# Patient Record
Sex: Female | Born: 1990
Health system: Southern US, Community
[De-identification: ages and names within clinical notes are randomized; demographics above are authoritative.]

## PROBLEM LIST (undated history)

## (undated) DIAGNOSIS — F329 Major depressive disorder, single episode, unspecified: Secondary | ICD-10-CM

## (undated) DIAGNOSIS — F32A Depression, unspecified: Secondary | ICD-10-CM

## (undated) DIAGNOSIS — F419 Anxiety disorder, unspecified: Secondary | ICD-10-CM

## (undated) DIAGNOSIS — D649 Anemia, unspecified: Secondary | ICD-10-CM

## (undated) DIAGNOSIS — G43909 Migraine, unspecified, not intractable, without status migrainosus: Secondary | ICD-10-CM

## (undated) DIAGNOSIS — J45909 Unspecified asthma, uncomplicated: Secondary | ICD-10-CM

## (undated) DIAGNOSIS — Z720 Tobacco use: Secondary | ICD-10-CM

## (undated) DIAGNOSIS — T7840XA Allergy, unspecified, initial encounter: Secondary | ICD-10-CM

## (undated) HISTORY — PX: WISDOM TOOTH EXTRACTION: SHX21

## (undated) HISTORY — DX: Anxiety disorder, unspecified: F41.9

## (undated) HISTORY — DX: Migraine, unspecified, not intractable, without status migrainosus: G43.909

## (undated) HISTORY — PX: COSMETIC SURGERY: SHX468

## (undated) HISTORY — PX: NOSE SURGERY: SHX723

## (undated) HISTORY — DX: Major depressive disorder, single episode, unspecified: F32.9

## (undated) HISTORY — DX: Allergy, unspecified, initial encounter: T78.40XA

## (undated) HISTORY — DX: Unspecified asthma, uncomplicated: J45.909

## (undated) HISTORY — DX: Depression, unspecified: F32.A

## (undated) HISTORY — PX: ABDOMINAL HYSTERECTOMY: SHX81

## (undated) HISTORY — DX: Tobacco use: Z72.0

---
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
## 2008-06-04 ENCOUNTER — Emergency Department (HOSPITAL_COMMUNITY): Admission: EM | Admit: 2008-06-04 | Discharge: 2008-06-04 | Payer: Self-pay | Admitting: Family Medicine

## 2008-09-08 ENCOUNTER — Emergency Department (HOSPITAL_COMMUNITY): Admission: EM | Admit: 2008-09-08 | Discharge: 2008-09-08 | Payer: Self-pay | Admitting: Family Medicine

## 2008-11-21 ENCOUNTER — Emergency Department (HOSPITAL_COMMUNITY): Admission: EM | Admit: 2008-11-21 | Discharge: 2008-11-22 | Payer: Self-pay | Admitting: Emergency Medicine

## 2013-04-26 ENCOUNTER — Ambulatory Visit (INDEPENDENT_AMBULATORY_CARE_PROVIDER_SITE_OTHER): Payer: BC Managed Care – PPO | Admitting: Internal Medicine

## 2013-04-26 VITALS — BP 102/72 | HR 67 | Temp 98.8°F | Resp 17 | Ht 60.0 in | Wt 136.0 lb

## 2013-04-26 DIAGNOSIS — J029 Acute pharyngitis, unspecified: Secondary | ICD-10-CM

## 2013-04-26 DIAGNOSIS — H9209 Otalgia, unspecified ear: Secondary | ICD-10-CM

## 2013-04-26 DIAGNOSIS — H9202 Otalgia, left ear: Secondary | ICD-10-CM

## 2013-04-26 LAB — POCT RAPID STREP A (OFFICE): Rapid Strep A Screen: NEGATIVE

## 2013-04-26 MED ORDER — DOXYCYCLINE HYCLATE 100 MG PO TABS: 100.0000 mg | ORAL_TABLET | Freq: Two times a day (BID) | ORAL | Status: DC

## 2013-04-26 NOTE — Progress Notes (Signed)
  Subjective:    Patient ID: Jessica Cross, female    DOB: 1990/09/26, 22 y.o.   MRN: [id number]  HPI complaining of 24-hour history of left ear pain with mild sore throat on the left No fever No dysphagia No change in hearing No cough or runny nose    Review of Systems     Objective:   Physical Exam BP 102/72  Pulse 67  Temp(Src) 98.8 F (37.1 C) (Oral)  Resp 17  Ht 5' (1.524 m)  Wt 136 lb (61.689 kg)  BMI 26.56 kg/m2  SpO2 99%  LMP 04/12/2013  both TMs clear There is a swollen area inferiorly at the auditory meatus which is not read or pustular but is very tender to palpation No regional adenopathy The rest of the ENT is clear except mild erythema in the posterior pharynx without exudate   Results for orders placed in visit on 04/26/13  POCT RAPID STREP A (OFFICE)      Result Value Range   Rapid Strep A Screen Negative  Negative       Assessment & Plan:  Furuncle at aud meatus-L Viral pharyngitis Meds ordered this encounter  Medications  . doxycycline (VIBRA-TABS) 100 MG tablet    Sig: Take 1 tablet (100 mg total) by mouth 2 (two) times daily.    Dispense:  20 tablet    Refill:  0  Hot compresses Followup if increased swelling to consider I&D

## 2013-04-28 LAB — CULTURE, GROUP A STREP

## 2013-05-23 ENCOUNTER — Ambulatory Visit (INDEPENDENT_AMBULATORY_CARE_PROVIDER_SITE_OTHER): Payer: BC Managed Care – PPO | Admitting: Emergency Medicine

## 2013-05-23 VITALS — BP 110/70 | HR 62 | Temp 98.7°F | Resp 16 | Ht 60.0 in | Wt 138.0 lb

## 2013-05-23 DIAGNOSIS — J029 Acute pharyngitis, unspecified: Secondary | ICD-10-CM

## 2013-05-23 DIAGNOSIS — J40 Bronchitis, not specified as acute or chronic: Secondary | ICD-10-CM

## 2013-05-23 LAB — POCT RAPID STREP A (OFFICE): Rapid Strep A Screen: NEGATIVE

## 2013-05-23 NOTE — Patient Instructions (Signed)

## 2013-05-23 NOTE — Progress Notes (Signed)
[facility] [address] [place] [address] [phone]  Date:  05/23/2013   Name:  Jessica Cross   DOB:  03/21/1991   MRN:  [id number]  PCP:  Vonita Moss, MD    Chief Complaint: Cough and Sore Throat   History of Present Illness:  Jessica Cross is a 22 y.o. very pleasant female patient who presents with the following:  Has 3-4 week history of sore throat.  Says she has a dry feeling throat and a moderate nasal mucoid drainage.  No post nasal drip, fever or chills.  No nausea or vomiting. Now has cough that is not productive over past three days.  no wheezing or shortness of breath.  Waxes and wanes.  No improvement with over the counter medications or other home remedies. Denies other complaint or health concern today.   There are no active problems to display for this patient.   Past Medical History  Diagnosis Date  . Asthma   . Depression     No past surgical history on file.  History  Substance Use Topics  . Smoking status: Current Every Day Smoker  . Smokeless tobacco: Not on file  . Alcohol Use: Yes    No family history on file.  No Known Allergies  Medication list has been reviewed and updated.  Current Outpatient Prescriptions on File Prior to Visit  Medication Sig Dispense Refill  . FLUoxetine (PROZAC) 20 MG capsule Take 20 mg by mouth daily.      . Norgestimate-Ethinyl Estradiol Triphasic (ORTHO TRI-CYCLEN LO) 0.18/0.215/0.25 MG-25 MCG tab Take 1 tablet by mouth daily.      Marland Kitchen doxycycline (VIBRA-TABS) 100 MG tablet Take 1 tablet (100 mg total) by mouth 2 (two) times daily.  20 tablet  0   No current facility-administered medications on file prior to visit.    Review of Systems:  As per HPI, otherwise negative.    Physical Examination: Filed Vitals:   05/23/13 1743  BP: 110/70  Pulse: 62  Temp: 98.7 F (37.1 C)  Resp: 16   Filed Vitals:   05/23/13 1743  Height: 5' (1.524 m)  Weight: 138 lb (62.596 kg)   Body mass  index is 26.95 kg/(m^2). Ideal Body Weight: Weight in (lb) to have BMI = 25: 127.7  GEN: WDWN, NAD, Non-toxic, A & O x 3 HEENT: Atraumatic, Normocephalic. Neck supple. No masses, anterior cervical LAD. Ears and Nose: No external deformity. CV: RRR, No M/G/R. No JVD. No thrill. No extra heart sounds. PULM: CTA B, no wheezes, crackles, rhonchi. No retractions. No resp. distress. No accessory muscle use. ABD: S, NT, ND, +BS. No rebound. No HSM. EXTR: No c/c/e NEURO Normal gait.  PSYCH: Normally interactive. Conversant. Not depressed or anxious appearing.  Calm demeanor.    Assessment and Plan: Bronchitis Cold   Signed,  Phillips Odor, MD   Results for orders placed in visit on 05/23/13  POCT RAPID STREP A (OFFICE)      Result Value Range   Rapid Strep A Screen Negative  Negative

## 2013-09-09 DIAGNOSIS — D649 Anemia, unspecified: Secondary | ICD-10-CM | POA: Insufficient documentation

## 2014-10-20 ENCOUNTER — Ambulatory Visit (INDEPENDENT_AMBULATORY_CARE_PROVIDER_SITE_OTHER): Payer: 59 | Admitting: Family Medicine

## 2014-10-20 VITALS — BP 110/72 | HR 76 | Temp 98.7°F | Resp 18 | Ht 61.0 in | Wt 153.0 lb

## 2014-10-20 DIAGNOSIS — J029 Acute pharyngitis, unspecified: Secondary | ICD-10-CM

## 2014-10-20 LAB — POCT RAPID STREP A (OFFICE): RAPID STREP A SCREEN: NEGATIVE

## 2014-10-20 MED ORDER — FIRST-DUKES MOUTHWASH MT SUSP: OROMUCOSAL | Status: DC

## 2014-10-20 NOTE — Patient Instructions (Signed)
You have ulcers on your tonsils- this likely indicates a viral throat infection. I will be in touch with your culture but it will probably be negative for strep. Rest, drink plenty of fluids, use ibuprofen for pain (up to 800 mg three times a day) and/ or tylenol You can also use the throat wash as needed Let me know if you are not feeling better in the next couple of days or if you develop any other symptoms.

## 2014-10-20 NOTE — Progress Notes (Signed)
[facility] [address], [place] [address] [phone]  Date:  10/20/2014   Name:  Jessica Cross   DOB:  [birth year]   MRN:  [id number]  PCP:  Golden Pop, MD    Chief Complaint: Sore Throat   History of Present Illness:  Jessica Cross is a 24 y.o. very pleasant female patient who presents with the following:  She is here today with a ST that started yesterday.  She has had a few bad ST in the past, but nothing very recently.  She was recenttly around her newborn nephew.  She feels well except for her throat.  It hurts to talk and swallow.  No cough.    There are no active problems to display for this patient.   Past Medical History  Diagnosis Date  . Asthma   . Depression     Past Surgical History  Procedure Laterality Date  . Cosmetic surgery    . Nose surgery      History  Substance Use Topics  . Smoking status: Current Every Day Smoker  . Smokeless tobacco: Not on file  . Alcohol Use: 0.0 oz/week    0 Standard drinks or equivalent per week    History reviewed. No pertinent family history.  No Known Allergies  Medication list has been reviewed and updated.  Current Outpatient Prescriptions on File Prior to Visit  Medication Sig Dispense Refill  . FLUoxetine (PROZAC) 20 MG capsule Take 20 mg by mouth daily.    . Norgestimate-Ethinyl Estradiol Triphasic (ORTHO TRI-CYCLEN LO) 0.18/0.215/0.25 MG-25 MCG tab Take 1 tablet by mouth daily.    Marland Kitchen doxycycline (VIBRA-TABS) 100 MG tablet Take 1 tablet (100 mg total) by mouth 2 (two) times daily. (Patient not taking: Reported on 10/20/2014) 20 tablet 0   No current facility-administered medications on file prior to visit.    Review of Systems:  As per HPI- otherwise negative.   Physical Examination: Filed Vitals:   10/20/14 1158  BP: 110/72  Pulse: 76  Temp: 98.7 F (37.1 C)  Resp: 18   Filed Vitals:   10/20/14 1158  Height: 5\' 1"  (1.549 m)  Weight: 153 lb (69.4 kg)   Body  mass index is 28.92 kg/(m^2). Ideal Body Weight: Weight in (lb) to have BMI = 25: 132  GEN: WDWN, NAD, Non-toxic, A & O x 3 HEENT: Atraumatic, Normocephalic. Neck supple. No masses, No LAD.  Bilateral TM wnl, oropharynx displays small ulcers on tonsils. No exudate or tonsilar enlargement.  PEERL,EOMI.   Ears and Nose: No external deformity. CV: RRR, No M/G/R. No JVD. No thrill. No extra heart sounds. PULM: CTA B, no wheezes, crackles, rhonchi. No retractions. No resp. distress. No accessory muscle use. ABD: S, NT, ND. No rebound. No HSM. EXTR: No c/c/e NEURO Normal gait.  PSYCH: Normally interactive. Conversant. Not depressed or anxious appearing.  Calm demeanor.   Results for orders placed or performed in visit on 10/20/14  POCT rapid strep A  Result Value Ref Range   Rapid Strep A Screen Negative Negative    Assessment and Plan: Sore throat - Plan: POCT rapid strep A, Culture, Group A Strep, Diphenhyd-Hydrocort-Nystatin (FIRST-DUKES MOUTHWASH) SUSP  Likely viral ST given ulcers.  Advised regarding supportive care, rx for MM to use as needed Follow-up if not better soon Will wait on her strep culture   Signed Lamar Blinks, MD

## 2014-10-22 LAB — CULTURE, GROUP A STREP: Organism ID, Bacteria: NORMAL

## 2014-10-24 ENCOUNTER — Ambulatory Visit (INDEPENDENT_AMBULATORY_CARE_PROVIDER_SITE_OTHER): Payer: 59 | Admitting: Physician Assistant

## 2014-10-24 ENCOUNTER — Encounter: Payer: Self-pay | Admitting: Family Medicine

## 2014-10-24 ENCOUNTER — Encounter: Payer: Self-pay | Admitting: Physician Assistant

## 2014-10-24 VITALS — BP 105/67 | HR 71 | Temp 98.8°F | Resp 16 | Ht 60.0 in | Wt 151.6 lb

## 2014-10-24 DIAGNOSIS — J069 Acute upper respiratory infection, unspecified: Secondary | ICD-10-CM | POA: Diagnosis not present

## 2014-10-24 DIAGNOSIS — J029 Acute pharyngitis, unspecified: Secondary | ICD-10-CM | POA: Diagnosis not present

## 2014-10-24 DIAGNOSIS — B9789 Other viral agents as the cause of diseases classified elsewhere: Principal | ICD-10-CM

## 2014-10-24 MED ORDER — IPRATROPIUM BROMIDE 0.03 % NA SOLN: 2.0000 | Freq: Two times a day (BID) | NASAL | Status: DC

## 2014-10-24 NOTE — Patient Instructions (Signed)
Continue mucinex and cough drops. Continue mouthwash as needed. Stop afrin and start atrovent nasal spray twice a day. I will call you with your lab results. Call me if your symptoms are not improving in 4-5 days and I will be happy to call in an antibiotic.

## 2014-10-24 NOTE — Progress Notes (Signed)
Subjective:    Patient ID: Jessica Cross, female    DOB: 03/02/1991, 24 y.o.   MRN: [id number]  HPI  This is a 24 year old female presenting with 6 days of sore throat. She was seen here 5 days ago. At that time rapid strep and culture negative. She was prescribed Dukes Magic mouthwash. She states the mouthwash helps but overall her sore throat is worsening. She has developed new symptoms over the past 3 days. These include headache nasal discharge sneezing and a dry cough. She states she feels very tired. She has noticed swelling of her lymph nodes with pain over the swollen lymph node on the left. In addition to the mouthwash she is also using Afrin, Mucinex and cough drops. These are helping some. She does not have a history of environmental allergies. She does not have a history of asthma. She denies fever, chills, otalgia, shortness of breath or wheezing.  Review of Systems  Constitutional: Negative for fever and chills.  HENT: Positive for congestion, sinus pressure and sore throat. Negative for ear pain, trouble swallowing and voice change.   Eyes: Negative for redness.  Respiratory: Positive for cough. Negative for shortness of breath and wheezing.   Gastrointestinal: Negative for nausea, vomiting and abdominal pain.  Skin: Negative for rash.  Allergic/Immunologic: Negative for environmental allergies.  Neurological: Positive for headaches.  Hematological: Positive for adenopathy.  Psychiatric/Behavioral: Negative for sleep disturbance.   There are no active problems to display for this patient.  Prior to Admission medications   Medication Sig Start Date End Date Taking? Authorizing Provider  Diphenhyd-Hydrocort-Nystatin (FIRST-DUKES MOUTHWASH) SUSP Mix 1:1 with viscous lidocaine.  1 tsp gargle and spit as needed 10/20/14  Yes Jessica C Copland, MD  FLUoxetine (PROZAC) 20 MG capsule Take 20 mg by mouth daily.   Yes Historical Provider, MD  Norgestimate-Ethinyl Estradiol Triphasic  (ORTHO TRI-CYCLEN LO) 0.18/0.215/0.25 MG-25 MCG tab Take 1 tablet by mouth daily.   Yes Historical Provider, MD          No Known Allergies  Patient's social and family history were reviewed.     Objective:   Physical Exam  Constitutional: She is oriented to person, place, and time. She appears well-developed and well-nourished. No distress.  HENT:  Head: Normocephalic and atraumatic.  Right Ear: Hearing, tympanic membrane, external ear and ear canal normal.  Left Ear: Hearing, tympanic membrane, external ear and ear canal normal.  Nose: Mucosal edema present. Right sinus exhibits no maxillary sinus tenderness and no frontal sinus tenderness. Left sinus exhibits maxillary sinus tenderness. Left sinus exhibits no frontal sinus tenderness.  Mouth/Throat: Uvula is midline and mucous membranes are normal. Posterior oropharyngeal edema (mild) and posterior oropharyngeal erythema present. No oropharyngeal exudate.  Eyes: Conjunctivae and lids are normal. Right eye exhibits no discharge. Left eye exhibits no discharge. No scleral icterus.  Cardiovascular: Normal rate, regular rhythm, normal heart sounds and normal pulses.   No murmur heard. Pulmonary/Chest: Effort normal and breath sounds normal. No respiratory distress. She has no wheezes. She has no rhonchi. She has no rales.  Musculoskeletal: Normal range of motion.  Lymphadenopathy:    She has cervical adenopathy (bilateral anterior, tender over left node).  Neurological: She is alert and oriented to person, place, and time.  Skin: Skin is warm, dry and intact. No lesion and no rash noted.  Psychiatric: She has a normal mood and affect. Her speech is normal and behavior is normal. Thought content normal.   BP 105/67 mmHg  Pulse 71  Temp(Src) 98.8 F (37.1 C) (Oral)  Resp 16  Ht 5' (1.524 m)  Wt 151 lb 9.6 oz (68.765 kg)  BMI 29.61 kg/m2  SpO2 99%  LMP 10/21/2014 (Approximate)     Assessment & Plan:  1. Sore throat 2. Viral URI  with cough Symptoms are likely due to virus. Worsening of sore throat likely due to new postnasal drip. She will continue home Mucinex and Duke's mouthwash. She will stop Afrin and start using Atrovent nasal spray. CBC and EBV panel pending. If her symptoms are not getting better in 4-5 days she will call and I will send in an antibiotic for her possible sinusitis due to left maxillary sinus tenderness on exam.  - Epstein-Barr virus VCA antibody panel - CBC - ipratropium (ATROVENT) 0.03 % nasal spray; Place 2 sprays into both nostrils 2 (two) times daily.  Dispense: 30 mL; Refill: 0   Bernard Donahoo V. Drenda Freeze, MHS [facility]  10/24/2014

## 2014-10-25 LAB — CBC
HCT: 38 % (ref 36.0–46.0)
Hemoglobin: 12.9 g/dL (ref 12.0–15.0)
MCH: 29.5 pg (ref 26.0–34.0)
MCHC: 33.9 g/dL (ref 30.0–36.0)
MCV: 87 fL (ref 78.0–100.0)
MPV: 10.5 fL (ref 8.6–12.4)
Platelets: 245 10*3/uL (ref 150–400)
RBC: 4.37 MIL/uL (ref 3.87–5.11)
RDW: 12.8 % (ref 11.5–15.5)
WBC: 6.4 10*3/uL (ref 4.0–10.5)

## 2014-10-25 LAB — EPSTEIN-BARR VIRUS VCA ANTIBODY PANEL
EBV EA IgG: 64.8 U/mL — ABNORMAL HIGH (ref <9.0)
EBV NA IGG: 511 U/mL — AB (ref <18.0)
EBV VCA IGG: 469 U/mL — AB (ref <18.0)
EBV VCA IgM: 10.1 U/mL (ref <36.0)

## 2014-11-21 ENCOUNTER — Ambulatory Visit: Payer: 59 | Admitting: Physician Assistant

## 2014-11-28 ENCOUNTER — Ambulatory Visit (INDEPENDENT_AMBULATORY_CARE_PROVIDER_SITE_OTHER): Payer: 59 | Admitting: Unknown Physician Specialty

## 2014-11-28 ENCOUNTER — Encounter: Payer: Self-pay | Admitting: Unknown Physician Specialty

## 2014-11-28 VITALS — BP 118/63 | HR 67 | Temp 98.0°F | Ht 60.0 in | Wt 155.2 lb

## 2014-11-28 DIAGNOSIS — W57XXXA Bitten or stung by nonvenomous insect and other nonvenomous arthropods, initial encounter: Secondary | ICD-10-CM | POA: Diagnosis not present

## 2014-11-28 DIAGNOSIS — S80861A Insect bite (nonvenomous), right lower leg, initial encounter: Secondary | ICD-10-CM

## 2014-11-28 MED ORDER — DOXYCYCLINE HYCLATE 100 MG PO TABS: 100.0000 mg | ORAL_TABLET | Freq: Two times a day (BID) | ORAL | Status: DC

## 2014-11-28 NOTE — Patient Instructions (Signed)
Avoid the sun while on medications.

## 2014-11-28 NOTE — Progress Notes (Signed)
   BP 118/63 mmHg  Pulse 67  Temp(Src) 98 F (36.7 C) (Oral)  Ht 5' (1.524 m)  Wt 155 lb 3.2 oz (70.398 kg)  BMI 30.31 kg/m2  SpO2 98%  LMP 11/28/2014   Subjective:    Patient ID: Jessica Cross, female    DOB: 12-19-1990, 24 y.o.   MRN: [id number]  HPI: Jessica Cross is a 24 y.o. female presenting on 11/28/2014 for Tick Removal  Tick bite: Taking a shower and found a huge tick on her 3 days ago.  Taking pictures of it daily.  Yesterday had a bulls eye appearance.  No fever.  Complaints of fatigue which is typical and headache all day.  No rash   Relevant past medical, surgical, family and social history reviewed and updated as indicated. Interim medical history since our last visit reviewed. Allergies and medications reviewed and updated.  Review of Systems  Per HPI unless specifically indicated above     Objective:    BP 118/63 mmHg  Pulse 67  Temp(Src) 98 F (36.7 C) (Oral)  Ht 5' (1.524 m)  Wt 155 lb 3.2 oz (70.398 kg)  BMI 30.31 kg/m2  SpO2 98%  LMP 11/28/2014  Wt Readings from Last 3 Encounters:  11/28/14 155 lb 3.2 oz (70.398 kg)  12/26/10 144 lb (65.318 kg)  10/24/14 151 lb 9.6 oz (68.765 kg)    Physical Exam  Constitutional: She is oriented to person, place, and time. She appears well-developed and well-nourished. No distress.  HENT:  Head: Normocephalic and atraumatic.  Eyes: Conjunctivae and lids are normal. Right eye exhibits no discharge. Left eye exhibits no discharge. No scleral icterus.  Cardiovascular: Normal rate and regular rhythm.   Pulmonary/Chest: Effort normal and breath sounds normal. No respiratory distress.  Abdominal: Normal appearance. There is no splenomegaly or hepatomegaly.  Musculoskeletal: Normal range of motion.  Neurological: She is alert and oriented to person, place, and time.  Skin: Skin is intact. No rash noted. There is erythema. No pallor.     Erythemetous area right lower leg 4.5 cms  Psychiatric: She has a normal mood and  affect. Her behavior is normal. Judgment and thought content normal.       Assessment & Plan:   Problem List Items Addressed This Visit    None    Visit Diagnoses    Tick bite of lower leg, right, initial encounter    -  Primary    Tick bite with expanding area of erythema over the last 3 days.      Relevant Medications    doxycycline (VIBRA-TABS) 100 MG tablet         Follow up plan: No Follow-up on file.

## 2015-01-02 ENCOUNTER — Telehealth: Payer: Self-pay | Admitting: Unknown Physician Specialty

## 2015-01-02 NOTE — Telephone Encounter (Signed)
Patient was seen for a tick bite, but has not been seen for her mood for over a year Please ask pt to schedule an office visit in the next month for mood, asthma, migraines I'll send in one month of medicine to allow time to get in

## 2015-01-12 NOTE — Telephone Encounter (Signed)
Patient schedule appt 8/1 for f/u with Malachy Mood sd patient requested

## 2015-01-22 ENCOUNTER — Ambulatory Visit (INDEPENDENT_AMBULATORY_CARE_PROVIDER_SITE_OTHER): Payer: 59 | Admitting: Unknown Physician Specialty

## 2015-01-22 ENCOUNTER — Encounter: Payer: Self-pay | Admitting: Unknown Physician Specialty

## 2015-01-22 VITALS — BP 112/75 | HR 67 | Temp 98.4°F | Wt 155.0 lb

## 2015-01-22 DIAGNOSIS — F32A Depression, unspecified: Secondary | ICD-10-CM | POA: Insufficient documentation

## 2015-01-22 DIAGNOSIS — F329 Major depressive disorder, single episode, unspecified: Secondary | ICD-10-CM

## 2015-01-22 MED ORDER — FLUOXETINE HCL 20 MG PO CAPS: 20.0000 mg | ORAL_CAPSULE | Freq: Every day | ORAL | Status: DC

## 2015-01-22 NOTE — Assessment & Plan Note (Signed)
Stable, continue present medications.   

## 2015-01-22 NOTE — Progress Notes (Signed)
   BP 112/75 mmHg  Pulse 67  Temp(Src) 98.4 F (36.9 C)  Wt 155 lb (70.308 kg)  SpO2 98%  LMP 01/05/2015 (Approximate)   Subjective:    Patient ID: Jessica Cross, female    DOB: 1991/04/24, 24 y.o.   MRN: [id number]  HPI: Jessica Cross is a 24 y.o. female  Chief Complaint  Patient presents with  . Depression   Depression Just needs Prozac refill.  Doing well with current medications.  See PHQ 9 in which it was 6.  She went tot the beach and was without it and noticed a big difference.  She does have some trouble sleeping and having little energy on some days.    Relevant past medical, surgical, family and social history reviewed and updated as indicated. Interim medical history since our last visit reviewed. Allergies and medications reviewed and updated.  Review of Systems  Per HPI unless specifically indicated above     Objective:    BP 112/75 mmHg  Pulse 67  Temp(Src) 98.4 F (36.9 C)  Wt 155 lb (70.308 kg)  SpO2 98%  LMP 01/05/2015 (Approximate)  Wt Readings from Last 3 Encounters:  01/22/15 155 lb (70.308 kg)  11/28/14 155 lb 3.2 oz (70.398 kg)  12/26/10 144 lb (65.318 kg)    Physical Exam  Constitutional: She is oriented to person, place, and time. She appears well-developed and well-nourished. No distress.  HENT:  Head: Normocephalic and atraumatic.  Eyes: Conjunctivae and lids are normal. Right eye exhibits no discharge. Left eye exhibits no discharge. No scleral icterus.  Cardiovascular: Normal rate and regular rhythm.   Pulmonary/Chest: Effort normal. No respiratory distress.  Abdominal: Normal appearance and bowel sounds are normal. She exhibits no distension. There is no splenomegaly or hepatomegaly. There is no tenderness.  Musculoskeletal: Normal range of motion.  Neurological: She is alert and oriented to person, place, and time.  Skin: Skin is intact. No rash noted. No pallor.  Psychiatric: She has a normal mood and affect. Her behavior is normal.  Judgment and thought content normal.  Vitals reviewed.    Assessment & Plan:   Problem List Items Addressed This Visit      Unprioritized   Depression - Primary    Stable, continue present medications.       Relevant Medications   FLUoxetine (PROZAC) 20 MG capsule       Follow up plan: No Follow-up on file.

## 2015-06-13 ENCOUNTER — Other Ambulatory Visit: Payer: Self-pay | Admitting: Obstetrics and Gynecology

## 2015-06-13 ENCOUNTER — Ambulatory Visit
Admission: RE | Admit: 2015-06-13 | Discharge: 2015-06-13 | Disposition: A | Discharge status: Home / Self Care | Payer: 59 | Source: Ambulatory Visit | Attending: Obstetrics and Gynecology | Admitting: Obstetrics and Gynecology

## 2015-06-13 DIAGNOSIS — R102 Pelvic and perineal pain: Secondary | ICD-10-CM | POA: Insufficient documentation

## 2015-06-13 DIAGNOSIS — N83201 Unspecified ovarian cyst, right side: Secondary | ICD-10-CM | POA: Insufficient documentation

## 2015-10-17 ENCOUNTER — Ambulatory Visit (INDEPENDENT_AMBULATORY_CARE_PROVIDER_SITE_OTHER): Payer: 59

## 2015-10-17 ENCOUNTER — Ambulatory Visit (INDEPENDENT_AMBULATORY_CARE_PROVIDER_SITE_OTHER): Payer: 59 | Admitting: Family Medicine

## 2015-10-17 VITALS — BP 109/69 | HR 70 | Temp 98.5°F | Resp 16 | Ht 60.5 in | Wt 155.0 lb

## 2015-10-17 DIAGNOSIS — S63601A Unspecified sprain of right thumb, initial encounter: Secondary | ICD-10-CM

## 2015-10-17 DIAGNOSIS — M79644 Pain in right finger(s): Secondary | ICD-10-CM

## 2015-10-17 NOTE — Patient Instructions (Addendum)
Please wear the splint ordered at all times except when in the shower or bathing. We need to recheck your thumb in 10 days to 2 weeks.    IF you received an x-ray today, you will receive an invoice from [facility]. Please contact [facility] at [phone] with questions or concerns regarding your invoice.   IF you received labwork today, you will receive an invoice from [employer]. Please contact [facility] at [phone] with questions or concerns regarding your invoice.   Our billing staff will not be able to assist you with questions regarding bills from these companies.  You will be contacted with the lab results as soon as they are available. The fastest way to get your results is to activate your My Chart account. Instructions are located on the last page of this paperwork. If you have not heard from [place] regarding the results in 2 weeks, please contact this office.

## 2015-10-17 NOTE — Progress Notes (Signed)
This is a 25 year old woman who discovered eventually for cone. She fell 5 days ago on an outstretched hand and injured her right thumb. It's been increasingly painful since. She's noticed quite a bit of swelling and discoloration at the base of the dorsal column.  Patient denies any other injury  Objective:BP 109/69 mmHg  Pulse 70  Temp(Src) 98.5 F (36.9 C)  Resp 16  Ht 5' 0.5" (1.537 m)  Wt 155 lb (70.308 kg)  BMI 29.76 kg/m2 Patient is alert and cooperative, articulate Examination of the right hand reveals swelling and ecchymosis diffusely over the dorsum of the right thumb. She cannot make a fist because she cannot flex the thumb. She is tender in the proximal phalanx and thumb metacarpal.  Pain is reproduced with abduction stress on MCP joint of right thumb  The wrist has full range of motion and has no tenderness. The anatomical snuffbox is nontender.  [facility] reading (PRIMARY) by  Dr. Jarome Lamas fracture seen  Assessment: Right ulnar collateral ligament sprain (gamekeeper's thumb)  Plan: Gamekeepers thumb splint, recheck 2 weeks  Signed, Carola Frost.D.

## 2015-11-26 ENCOUNTER — Telehealth: Payer: 59 | Admitting: Family

## 2015-11-26 DIAGNOSIS — R05 Cough: Secondary | ICD-10-CM

## 2015-11-26 DIAGNOSIS — R059 Cough, unspecified: Secondary | ICD-10-CM

## 2015-11-26 DIAGNOSIS — J209 Acute bronchitis, unspecified: Secondary | ICD-10-CM | POA: Diagnosis not present

## 2015-11-26 MED ORDER — AZITHROMYCIN 250 MG PO TABS: ORAL_TABLET | ORAL | Status: DC

## 2015-11-26 MED ORDER — BENZONATATE 100 MG PO CAPS: 100.0000 mg | ORAL_CAPSULE | Freq: Three times a day (TID) | ORAL | Status: DC | PRN

## 2015-11-26 NOTE — Progress Notes (Signed)

## 2016-02-15 ENCOUNTER — Other Ambulatory Visit: Payer: Self-pay

## 2016-02-15 ENCOUNTER — Ambulatory Visit: Payer: 59 | Admitting: Family Medicine

## 2016-02-19 ENCOUNTER — Ambulatory Visit: Payer: 59 | Admitting: Family Medicine

## 2016-07-02 ENCOUNTER — Telehealth: Payer: 59 | Admitting: Nurse Practitioner

## 2016-07-02 DIAGNOSIS — R05 Cough: Secondary | ICD-10-CM

## 2016-07-02 DIAGNOSIS — R059 Cough, unspecified: Secondary | ICD-10-CM

## 2016-07-02 MED ORDER — AZITHROMYCIN 250 MG PO TABS: ORAL_TABLET | ORAL | 0 refills | Status: DC

## 2016-07-02 MED ORDER — BENZONATATE 100 MG PO CAPS: 100.0000 mg | ORAL_CAPSULE | Freq: Three times a day (TID) | ORAL | 0 refills | Status: DC | PRN

## 2016-07-02 NOTE — Progress Notes (Signed)

## 2016-07-15 ENCOUNTER — Telehealth: Payer: 59 | Admitting: Family

## 2016-07-15 DIAGNOSIS — R059 Cough, unspecified: Secondary | ICD-10-CM

## 2016-07-15 DIAGNOSIS — R05 Cough: Secondary | ICD-10-CM

## 2016-07-15 NOTE — Progress Notes (Signed)
Based on what you shared with me it looks like you have a serious condition that should be evaluated in a face to face office visit.  NOTE: Even if you have entered your credit card information for this eVisit, you will not be charged.   If you are having a true medical emergency please call 911.  If you need an urgent face to face visit, [place] has four urgent care centers for your convenience.  If you need care fast and have a high deductible or no insurance consider:   [email or web address]  [phone]  [address], [address] [place], [place] [address] 8 am to 8 pm Monday-Friday 10 am to 4 pm Saturday-Sunday   The following sites will take your  insurance:    . [facility]  [phone] Get Driving Directions Find a Provider at this Location  [address] [place], [place] [address] . 10 am to 8 pm Monday-Friday . 12 pm to 8 pm Saturday-Sunday   . [facility]  [phone] Get Driving Directions Find a Provider at this Location  [address], [address] [place], [place] [address] . 8 am to 8 pm Monday-Friday . 9 am to 6 pm Saturday . 11 am to 6 pm Sunday   . [facility] [place]  [phone] Get Driving Directions  [address] [place], [place] [address] . 8 am to 8 pm Monday-Friday . 8 am to 4 pm Saturday-Sunday   Your e-visit answers were reviewed by a board certified advanced clinical practitioner to complete your personal care plan.  Thank you for using e-Visits.  

## 2016-07-16 ENCOUNTER — Encounter: Payer: Self-pay | Admitting: Family Medicine

## 2016-07-16 ENCOUNTER — Ambulatory Visit (INDEPENDENT_AMBULATORY_CARE_PROVIDER_SITE_OTHER): Payer: 59 | Admitting: Family Medicine

## 2016-07-16 VITALS — BP 120/80 | HR 84 | Temp 98.8°F | Wt 151.0 lb

## 2016-07-16 DIAGNOSIS — J069 Acute upper respiratory infection, unspecified: Secondary | ICD-10-CM | POA: Diagnosis not present

## 2016-07-16 MED ORDER — DOXYCYCLINE HYCLATE 100 MG PO TABS: 100.0000 mg | ORAL_TABLET | Freq: Two times a day (BID) | ORAL | 0 refills | Status: DC

## 2016-07-16 NOTE — Patient Instructions (Signed)
Follow up as needed

## 2016-07-16 NOTE — Progress Notes (Signed)
   BP 120/80   Pulse 84   Temp 98.8 F (37.1 C)   Wt 151 lb (68.5 kg)   LMP 06/18/2016 (Approximate)   SpO2 98%   BMI 29.00 kg/m    Subjective:    Patient ID: Jessica Cross, female    DOB: May 24, 1991, 26 y.o.   MRN: [id number]  HPI: Jessica Cross is a 26 y.o. female  Chief Complaint  Patient presents with  . URI    2 weeks ago did evisit and got antibiotic and improved, this Sunday her symptoms returned. Head/chest congestion, fatigue, runny nose, productive cough, sore throat in the beginning. No fever., no body aches, no ear ache.    Productive cough with chest tenderness, HA, congestion, fatigue, sore throat x 2 weeks. Did an evisit and was given z-pak and tessalon perles, started to feel better but then about 5 days ago symtoms returned. Tried another e visit but was told that she needed to come be seen as she did not improve with treatment. Persistent cough is most bothersome symptom. No fever, body aches, chills, facial pain, SOB.   Relevant past medical, surgical, family and social history reviewed and updated as indicated. Interim medical history since our last visit reviewed. Allergies and medications reviewed and updated.  Review of Systems  Constitutional: Positive for fatigue.  HENT: Positive for congestion and sore throat.   Eyes: Negative.   Respiratory: Positive for cough.   Cardiovascular: Negative.   Gastrointestinal: Negative.   Genitourinary: Negative.   Musculoskeletal: Negative.   Neurological: Negative.   Psychiatric/Behavioral: Negative.     Per HPI unless specifically indicated above     Objective:    BP 120/80   Pulse 84   Temp 98.8 F (37.1 C)   Wt 151 lb (68.5 kg)   LMP 06/18/2016 (Approximate)   SpO2 98%   BMI 29.00 kg/m   Wt Readings from Last 3 Encounters:  07/16/16 151 lb (68.5 kg)  10/17/15 155 lb (70.3 kg)  01/22/15 155 lb (70.3 kg)    Physical Exam  Constitutional: She is oriented to person, place, and time. She appears  well-developed and well-nourished.  HENT:  Head: Atraumatic.  Right Ear: External ear normal.  Left Ear: External ear normal.  Nose: Nose normal.  Mouth/Throat: No oropharyngeal exudate.  Oropharynx erythematous  Eyes: Conjunctivae are normal. Pupils are equal, round, and reactive to light. No scleral icterus.  Neck: Normal range of motion. Neck supple.  Cardiovascular: Normal rate and normal heart sounds.   Pulmonary/Chest: Effort normal and breath sounds normal. No respiratory distress.  Musculoskeletal: Normal range of motion.  Neurological: She is alert and oriented to person, place, and time.  Skin: Skin is warm and dry.  Psychiatric: She has a normal mood and affect. Her behavior is normal.  Nursing note and vitals reviewed.     Assessment & Plan:   Problem List Items Addressed This Visit    None    Visit Diagnoses    Upper respiratory tract infection, unspecified type    -  Primary   Will treat with doxycycline. Continue OTC remedies for symptomatic relief. Supportive care discussed. Follow up if no improvement       Follow up plan: Return if symptoms worsen or fail to improve.

## 2016-09-01 ENCOUNTER — Ambulatory Visit: Payer: Self-pay | Admitting: Family Medicine

## 2016-09-18 DIAGNOSIS — H6980 Other specified disorders of Eustachian tube, unspecified ear: Secondary | ICD-10-CM | POA: Diagnosis not present

## 2016-10-24 IMAGING — CR DG FINGER THUMB 2+V*R*
1 series · 1 of 1 positions shown · non-contrast
Comparison: None in PACs

CLINICAL DATA: Status post fall with persistent pain at the base of
the thumb.

EXAM:
RIGHT THUMB 2+V

[PA]
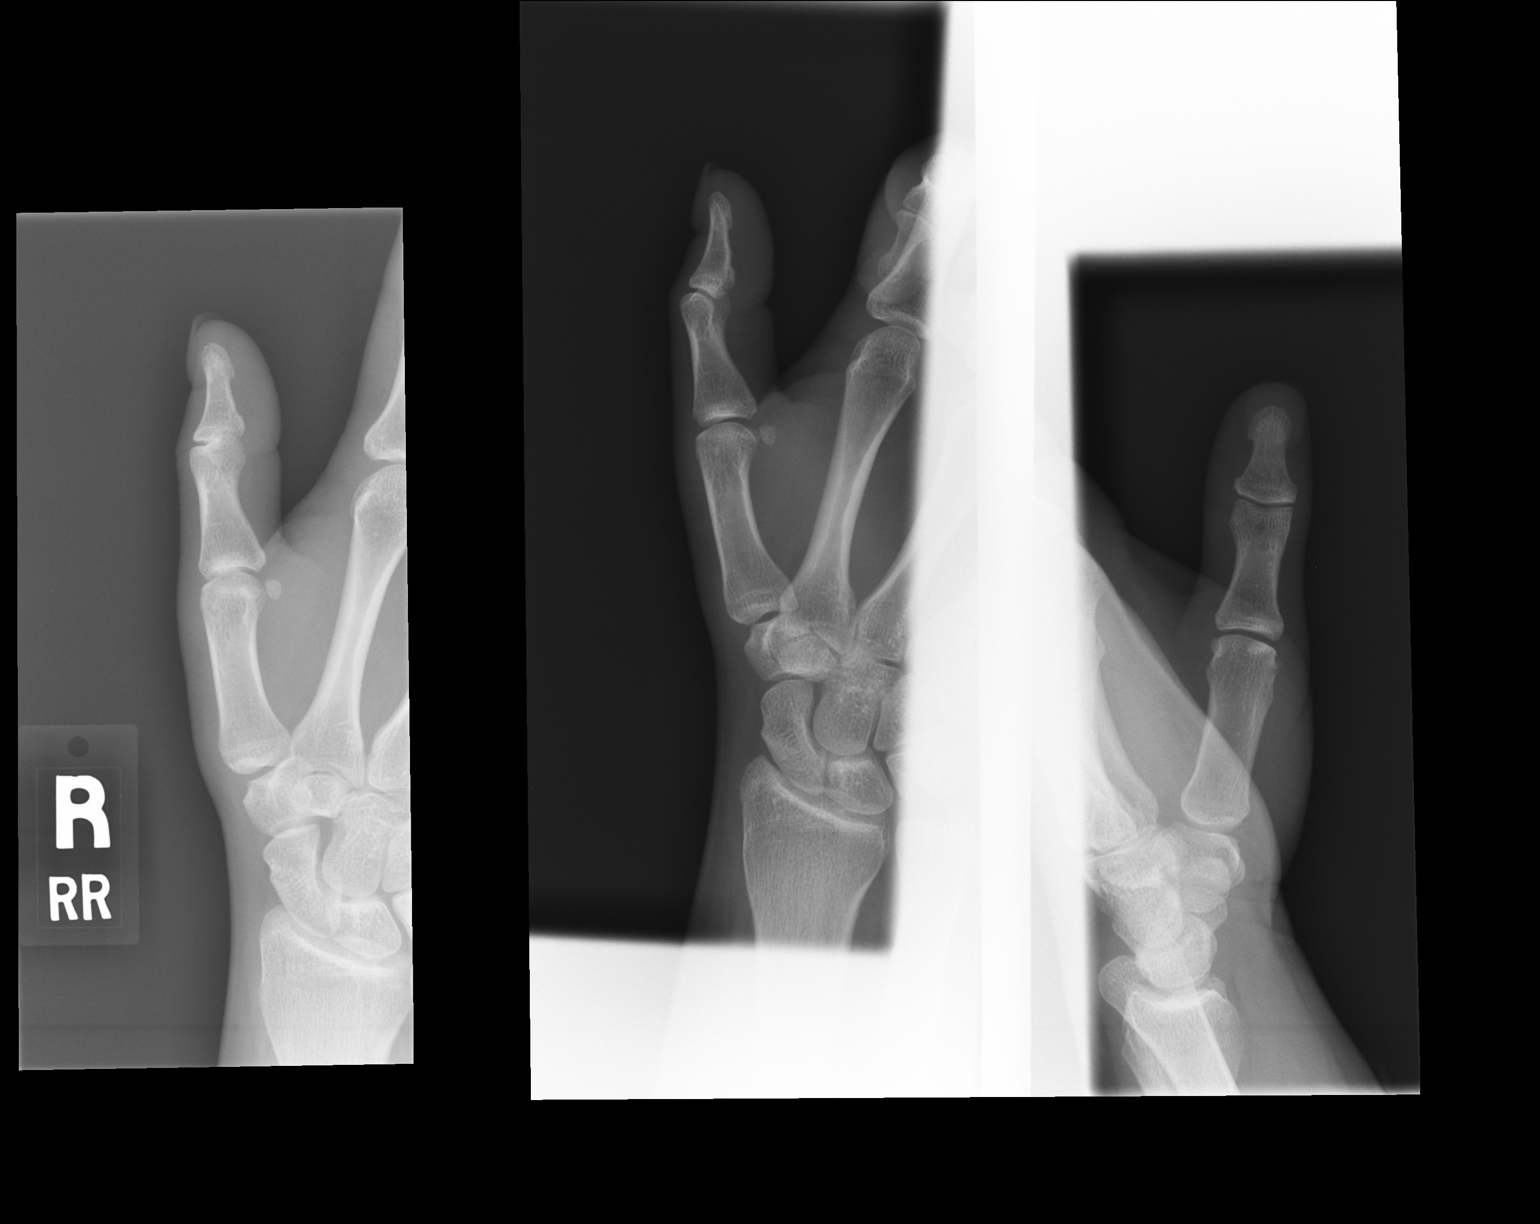

[1 of 1 positions shown; findings below may reference images not displayed]

FINDINGS: The bones of the thumb are adequately mineralized. There is no acute
fracture nor dislocation. The joint spaces are preserved. The
overlying soft tissues are unremarkable.
IMPRESSION: There is no acute fracture nor dislocation of the right thumb.

## 2016-12-08 ENCOUNTER — Ambulatory Visit (INDEPENDENT_AMBULATORY_CARE_PROVIDER_SITE_OTHER): Payer: 59 | Admitting: Family Medicine

## 2016-12-08 ENCOUNTER — Encounter: Payer: Self-pay | Admitting: Family Medicine

## 2016-12-08 VITALS — BP 111/75 | HR 72 | Temp 98.7°F | Wt 143.0 lb

## 2016-12-08 DIAGNOSIS — R1013 Epigastric pain: Secondary | ICD-10-CM

## 2016-12-08 DIAGNOSIS — R112 Nausea with vomiting, unspecified: Secondary | ICD-10-CM

## 2016-12-08 DIAGNOSIS — R197 Diarrhea, unspecified: Secondary | ICD-10-CM | POA: Diagnosis not present

## 2016-12-08 LAB — MICROSCOPIC EXAMINATION: BACTERIA UA: NONE SEEN

## 2016-12-08 LAB — UA/M W/RFLX CULTURE, ROUTINE
Bilirubin, UA: NEGATIVE
Glucose, UA: NEGATIVE
Leukocytes, UA: NEGATIVE
NITRITE UA: NEGATIVE
PH UA: 6 (ref 5.0–7.5)
RBC, UA: NEGATIVE
Specific Gravity, UA: 1.025 (ref 1.005–1.030)
UUROB: 4 mg/dL — AB (ref 0.2–1.0)

## 2016-12-08 LAB — CBC WITH DIFFERENTIAL/PLATELET
HEMATOCRIT: 44.3 % (ref 34.0–46.6)
Hemoglobin: 15 g/dL (ref 11.1–15.9)
Lymphocytes Absolute: 0.7 10*3/uL (ref 0.7–3.1)
Lymphs: 16 %
MCH: 30.4 pg (ref 26.6–33.0)
MCHC: 33.9 g/dL (ref 31.5–35.7)
MCV: 90 fL (ref 79–97)
MID (ABSOLUTE): 0.3 10*3/uL (ref 0.1–1.6)
MID: 6 %
Neutrophils Absolute: 3.7 10*3/uL (ref 1.4–7.0)
Neutrophils: 78 %
Platelets: 196 10*3/uL (ref 150–379)
RBC: 4.94 x10E6/uL (ref 3.77–5.28)
RDW: 13.1 % (ref 12.3–15.4)
WBC: 4.7 10*3/uL (ref 3.4–10.8)

## 2016-12-08 MED ORDER — ONDANSETRON 4 MG PO TBDP: 4.0000 mg | ORAL_TABLET | Freq: Three times a day (TID) | ORAL | 0 refills | Status: DC | PRN

## 2016-12-08 MED ORDER — SUCRALFATE 1 G PO TABS: 1.0000 g | ORAL_TABLET | Freq: Three times a day (TID) | ORAL | 0 refills | Status: DC

## 2016-12-08 MED ORDER — OMEPRAZOLE 20 MG PO CPDR: 20.0000 mg | DELAYED_RELEASE_CAPSULE | Freq: Every day | ORAL | 3 refills | Status: DC

## 2016-12-08 NOTE — Progress Notes (Signed)
BP 111/75   Pulse 72   Temp 98.7 F (37.1 C)   Wt 143 lb (64.9 kg)   LMP 11/24/2016 (Approximate)   SpO2 99%   BMI 27.47 kg/m    Subjective:    Patient ID: Jessica Cross, female    DOB: 1991-02-05, 26 y.o.   MRN: [id number]  HPI: Jessica Cross is a 26 y.o. female  Chief Complaint  Patient presents with  . Emesis    started Thursday night, for about 7 hrs, Friday was diarrhea all day, body aches. Sat was no diarrhea or vomiting, but stomach pain. Sun morning vomiting again. Today just having black colored diarrhea. Has been taking Pepto and Ibuprofen.    Patient presents with 5 day hx of intermittent vomiting, diarrhea, significant generalized body aches, chills, sweats, and epigastric abdominal pain. Denies fever, bright red blood in stools or vomit, weakness, syncope. Has been able to keep down soup, eggs, water, and ginger ale the past 24 hours. Did have some dark stools this morning that alarmed her. Tried taking theraflu when she started having body aches. Saturday took ibuprofen and pepto bismol. No new foods, no sick contacts or recent travel. No hx of gallbladder issues or abdominal surgeries, but does admit to intermittent GERD issues not currently on any medications. No concerns about pregnancy per pt, just came off period a little over a week ago.   Past Medical History:  Diagnosis Date  . Anxiety   . Asthma   . Depression   . Migraine   . Tobacco use    Social History   Social History  . Marital status: Single    Spouse name: N/A  . Number of children: N/A  . Years of education: N/A   Occupational History  . Not on file.   Social History Main Topics  . Smoking status: Former Smoker    Packs/day: 0.50    Types: Cigarettes    Quit date: 10/21/2016  . Smokeless tobacco: Never Used  . Alcohol use 3.0 - 6.0 oz/week    5 - 10 Standard drinks or equivalent per week     Comment: occasionally  . Drug use: No  . Sexual activity: Yes    Partners: Male   Other  Topics Concern  . Not on file   Social History Narrative  . No narrative on file   Relevant past medical, surgical, family and social history reviewed and updated as indicated. Interim medical history since our last visit reviewed. Allergies and medications reviewed and updated.  Review of Systems  Constitutional: Positive for chills and diaphoresis.  HENT: Negative.   Respiratory: Negative.   Cardiovascular: Negative.   Gastrointestinal: Positive for abdominal pain, diarrhea, nausea and vomiting.       Dark, tarry stools  Genitourinary: Positive for vaginal discharge (clear). Negative for dysuria and hematuria.  Musculoskeletal: Positive for myalgias.  Neurological: Negative.   Psychiatric/Behavioral: Negative.    Per HPI unless specifically indicated above     Objective:    BP 111/75   Pulse 72   Temp 98.7 F (37.1 C)   Wt 143 lb (64.9 kg)   LMP 11/24/2016 (Approximate)   SpO2 99%   BMI 27.47 kg/m   Wt Readings from Last 3 Encounters:  12/08/16 143 lb (64.9 kg)  07/16/16 151 lb (68.5 kg)  10/17/15 155 lb (70.3 kg)    Physical Exam  Constitutional: She is oriented to person, place, and time. She appears well-developed and well-nourished. No  distress.  HENT:  Head: Atraumatic.  Eyes: Conjunctivae are normal. Pupils are equal, round, and reactive to light.  Neck: Normal range of motion. Neck supple.  Cardiovascular: Normal rate and normal heart sounds.   Pulmonary/Chest: Effort normal and breath sounds normal. No respiratory distress.  Abdominal: Soft. Bowel sounds are normal. She exhibits no distension. There is tenderness (moderate ttp over epigastric area, minimal diffuse ttp).  Musculoskeletal: Normal range of motion. She exhibits no edema or tenderness.  Neurological: She is alert and oriented to person, place, and time. No cranial nerve deficit.  Skin: Skin is warm and dry.  Psychiatric: She has a normal mood and affect. Her behavior is normal.  Nursing note  and vitals reviewed.     Assessment & Plan:   Problem List Items Addressed This Visit    None     CBC, U/A lipase, and CMP all reassuring and normal. Suspect GI virus causative here. Will treat with carafate, prilosec, and zofran prn. BRAT diet, push fluids, imodium if diarrhea resumes. F/u if worsening or no improvement. Strict return precautions given.    Follow up plan: No Follow-up on file.

## 2016-12-09 LAB — COMPREHENSIVE METABOLIC PANEL
ALBUMIN: 4.3 g/dL (ref 3.5–5.5)
ALT: 20 IU/L (ref 0–32)
AST: 32 IU/L (ref 0–40)
Albumin/Globulin Ratio: 2 (ref 1.2–2.2)
Alkaline Phosphatase: 58 IU/L (ref 39–117)
BUN / CREAT RATIO: 13 (ref 9–23)
BUN: 8 mg/dL (ref 6–20)
Bilirubin Total: 0.4 mg/dL (ref 0.0–1.2)
CO2: 24 mmol/L (ref 20–29)
Calcium: 9.5 mg/dL (ref 8.7–10.2)
Chloride: 104 mmol/L (ref 96–106)
Creatinine, Ser: 0.62 mg/dL (ref 0.57–1.00)
GFR calc non Af Amer: 124 mL/min/{1.73_m2} (ref >59)
GFR, EST AFRICAN AMERICAN: 143 mL/min/{1.73_m2} (ref >59)
GLUCOSE: 91 mg/dL (ref 65–99)
Globulin, Total: 2.2 g/dL (ref 1.5–4.5)
Potassium: 4.2 mmol/L (ref 3.5–5.2)
Sodium: 140 mmol/L (ref 134–144)
TOTAL PROTEIN: 6.5 g/dL (ref 6.0–8.5)

## 2016-12-09 LAB — LIPASE: LIPASE: 26 U/L (ref 14–72)

## 2016-12-25 ENCOUNTER — Encounter: Payer: 59 | Admitting: Family Medicine

## 2017-03-26 ENCOUNTER — Encounter: Payer: Self-pay | Admitting: Family Medicine

## 2017-03-26 ENCOUNTER — Telehealth: Payer: Self-pay | Admitting: Family Medicine

## 2017-03-26 ENCOUNTER — Ambulatory Visit (INDEPENDENT_AMBULATORY_CARE_PROVIDER_SITE_OTHER): Payer: 59 | Admitting: Family Medicine

## 2017-03-26 VITALS — BP 120/71 | HR 68 | Temp 100.0°F | Wt 137.0 lb

## 2017-03-26 DIAGNOSIS — F331 Major depressive disorder, recurrent, moderate: Secondary | ICD-10-CM

## 2017-03-26 MED ORDER — HYDROXYZINE HCL 25 MG PO TABS: 25.0000 mg | ORAL_TABLET | Freq: Three times a day (TID) | ORAL | 0 refills | Status: DC | PRN

## 2017-03-26 MED ORDER — BUSPIRONE HCL 10 MG PO TABS: ORAL_TABLET | ORAL | 1 refills | Status: DC

## 2017-03-26 NOTE — Assessment & Plan Note (Signed)
With anxiety/panic episodes. Discussed importance of regular counseling - list provided and discussed EACP through work. Reviewed many treatment options, pt wanting to try buspar daily with prn hydroxyzine. Risks and benefits reviewed, pt agreeable to this plan. Will f/u in 1 month for recheck

## 2017-03-26 NOTE — Progress Notes (Signed)
BP 120/71   Pulse 68   Temp 100 F (37.8 C)   Wt 137 lb (62.1 kg)   LMP 03/19/2017 (Approximate)   SpO2 100%   BMI 26.32 kg/m    Subjective:    Patient ID: Jessica Cross, female    DOB: July 06, 1990, 26 y.o.   MRN: [id number]  HPI: Jessica Cross is a 26 y.o. female  Chief Complaint  Patient presents with  . Anxiety    been having anxiety for years, but juust has been worse lately. Was on prozac in the past.   Patient presents with worsening anxiety the past few months. Has struggled with anxiety and depression x 10 years since post-partum. Celexa and prozac have seemed makes it worse in the past. 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 Having more frequent panic episodes lately where she will feel a lump in her throat, hands will go numb, and she will feel chest tightness.   Relevant past medical, surgical, family and social history reviewed and updated as indicated. Interim medical history since our last visit reviewed. Allergies and medications reviewed and updated.   GAD 7 : Generalized Anxiety Score 03/26/2017  Nervous, Anxious, on Edge 3  Control/stop worrying 3  Worry too much - different things 3  Trouble relaxing 2  Restless 2  Easily annoyed or irritable 3  Afraid - awful might happen 3  Total GAD 7 Score 19  Anxiety Difficulty Very difficult   Depression screen [facility] 2/9 10/17/2015 01/22/2015 01/22/2015  Decreased Interest 0 1 1  Down, Depressed, Hopeless 0 0 0  PHQ - 2 Score 0 1 1  Altered sleeping - 2 -  Tired, decreased energy - 2 -  Change in appetite - 1 -  Feeling bad or failure about yourself  - 1 -  Trouble concentrating - 0 -  Moving slowly or fidgety/restless - 0 -  Suicidal thoughts - 0 -  PHQ-9 Score - 7 -    Review of Systems  Constitutional: Negative.   Respiratory: Negative.   Cardiovascular: Negative.   Gastrointestinal: Negative.   Genitourinary: Negative.   Musculoskeletal: Negative.     Neurological: Negative.   Psychiatric/Behavioral: Positive for agitation, dysphoric mood and sleep disturbance. The patient is nervous/anxious.    Per HPI unless specifically indicated above     Objective:    BP 120/71   Pulse 68   Temp 100 F (37.8 C)   Wt 137 lb (62.1 kg)   LMP 03/19/2017 (Approximate)   SpO2 100%   BMI 26.32 kg/m   Wt Readings from Last 3 Encounters:  03/26/17 137 lb (62.1 kg)  12/08/16 143 lb (64.9 kg)  07/16/16 151 lb (68.5 kg)    Physical Exam  Constitutional: She is oriented to person, place, and time. She appears well-developed and well-nourished.  HENT:  Head: Atraumatic.  Eyes: Pupils are equal, round, and reactive to light. Conjunctivae are normal.  Neck: Normal range of motion. Neck supple.  Cardiovascular: Normal rate and normal heart sounds.   Pulmonary/Chest: Effort normal and breath sounds normal. No respiratory distress.  Musculoskeletal: Normal range of motion.  Neurological: She is alert and oriented to person, place, and time.  Skin: Skin is warm and dry.  Psychiatric: Judgment and thought content normal.  Tearful  Nursing note and vitals reviewed.   Results for orders placed or performed in visit on 12/08/16  Microscopic Examination  Result Value Ref Range  WBC, UA 0-5 0 - 5 /hpf   RBC, UA 0-2 0 - 2 /hpf   Epithelial Cells (non renal) >10 (A) 0 - 10 /hpf   Mucus, UA Present (A) Not Estab.   Bacteria, UA None seen None seen/Few  CBC With Differential/Platelet  Result Value Ref Range   WBC 4.7 3.4 - 10.8 x10E3/uL   RBC 4.94 3.77 - 5.28 x10E6/uL   Hemoglobin 15.0 11.1 - 15.9 g/dL   Hematocrit 44.3 34.0 - 46.6 %   MCV 90 79 - 97 fL   MCH 30.4 26.6 - 33.0 pg   MCHC 33.9 31.5 - 35.7 g/dL   RDW 13.1 12.3 - 15.4 %   Platelets 196 150 - 379 x10E3/uL   Neutrophils 78 Not Estab. %   Lymphs 16 Not Estab. %   MID 6 Not Estab. %   Neutrophils Absolute 3.7 1.4 - 7.0 x10E3/uL   Lymphocytes Absolute 0.7 0.7 - 3.1 x10E3/uL   MID  (Absolute) 0.3 0.1 - 1.6 X10E3/uL  UA/M w/rflx Culture, Routine  Result Value Ref Range   Specific Gravity, UA 1.025 1.005 - 1.030   pH, UA 6.0 5.0 - 7.5   Color, UA Yellow Yellow   Appearance Ur Cloudy (A) Clear   Leukocytes, UA Negative Negative   Protein, UA 1+ (A) Negative/Trace   Glucose, UA Negative Negative   Ketones, UA Trace (A) Negative   RBC, UA Negative Negative   Bilirubin, UA Negative Negative   Urobilinogen, Ur 4.0 (H) 0.2 - 1.0 mg/dL   Nitrite, UA Negative Negative   Microscopic Examination See below:   Comprehensive metabolic panel  Result Value Ref Range   Glucose 91 65 - 99 mg/dL   BUN 8 6 - 20 mg/dL   Creatinine, Ser 0.62 0.57 - 1.00 mg/dL   GFR calc non Af Amer 124 >59 mL/min/1.73   GFR calc Af Amer 143 >59 mL/min/1.73   BUN/Creatinine Ratio 13 9 - 23   Sodium 140 134 - 144 mmol/L   Potassium 4.2 3.5 - 5.2 mmol/L   Chloride 104 96 - 106 mmol/L   CO2 24 20 - 29 mmol/L   Calcium 9.5 8.7 - 10.2 mg/dL   Total Protein 6.5 6.0 - 8.5 g/dL   Albumin 4.3 3.5 - 5.5 g/dL   Globulin, Total 2.2 1.5 - 4.5 g/dL   Albumin/Globulin Ratio 2.0 1.2 - 2.2   Bilirubin Total 0.4 0.0 - 1.2 mg/dL   Alkaline Phosphatase 58 39 - 117 IU/L   AST 32 0 - 40 IU/L   ALT 20 0 - 32 IU/L  Lipase  Result Value Ref Range   Lipase 26 14 - 72 U/L      Assessment & Plan:   Problem List Items Addressed This Visit      Other   Depression - Primary    With anxiety/panic episodes. Discussed importance of regular counseling - list provided and discussed EACP through work. Reviewed many treatment options, pt wanting to try buspar daily with prn hydroxyzine. Risks and benefits reviewed, pt agreeable to this plan. Will f/u in 1 month for recheck      Relevant Medications   busPIRone (BUSPAR) 10 MG tablet   hydrOXYzine (ATARAX/VISTARIL) 25 MG tablet       Follow up plan: Return in about 4 weeks (around 04/23/2017) for Depression/Anxiety f/u.

## 2017-03-26 NOTE — Patient Instructions (Signed)
Follow up in 1 month   

## 2017-03-26 NOTE — Telephone Encounter (Signed)
Patient requesting prescription for buspirone and hydroxyzine to [facility].  Patient went to pharmacy after appt. And medication was not ready. Patient would like to check on status of medication.   Patient would like a call when medication is ready for pickup.  Please Advise.  Thank you

## 2017-03-26 NOTE — Telephone Encounter (Signed)
Patient notified that rxs have been sent in. 

## 2017-04-05 IMAGING — US US TRANSVAGINAL NON-OB
1 series · 14 of 25 positions shown · non-contrast
Comparison: No prior.

CLINICAL DATA: Pelvic pain.

EXAM:
TRANSABDOMINAL AND TRANSVAGINAL ULTRASOUND OF PELVIS
TECHNIQUE: Both transabdominal and transvaginal ultrasound examinations of the
pelvis were performed. Transabdominal technique was performed for
global imaging of the pelvis including uterus, ovaries, adnexal
regions, and pelvic cul-de-sac. It was necessary to proceed with
endovaginal exam following the transabdominal exam to visualize the
uterus and ovaries..

[Series 1: us transvaginal non-ob · 0.24mm/px · 14 of 94 slices shown]
[im 1/94]
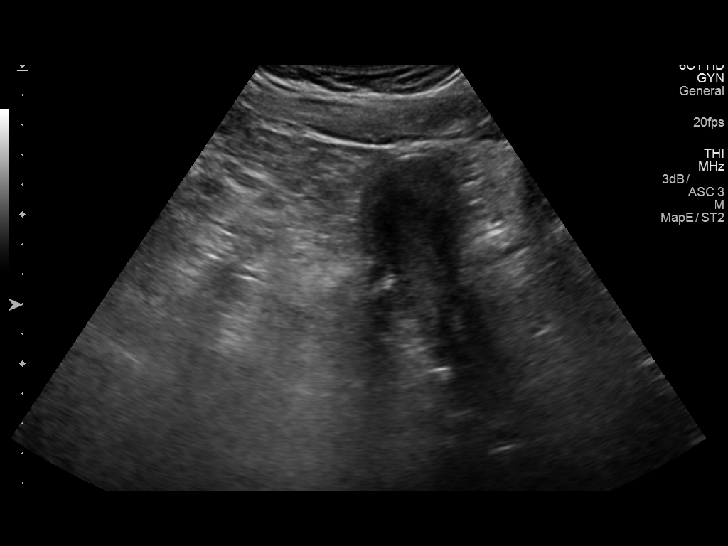
[im 8/94]
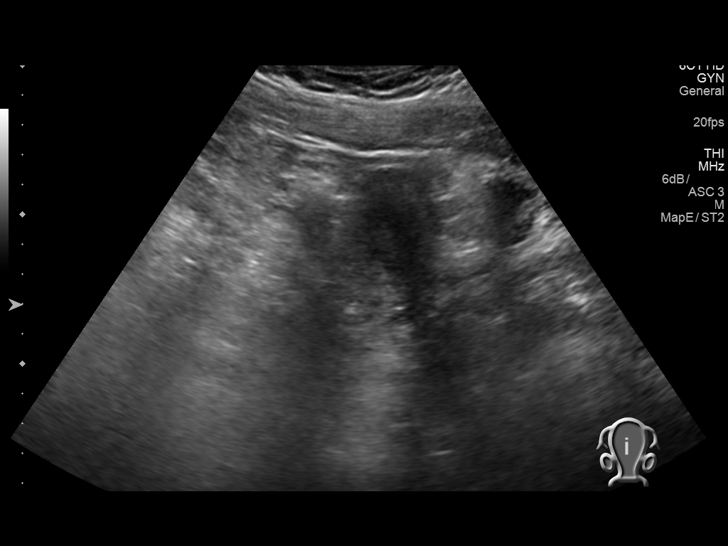
[im 16/94]
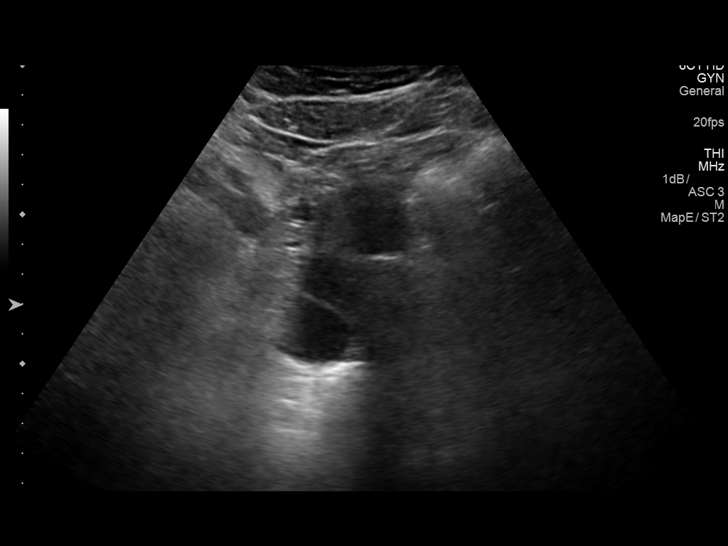
[im 24/94]
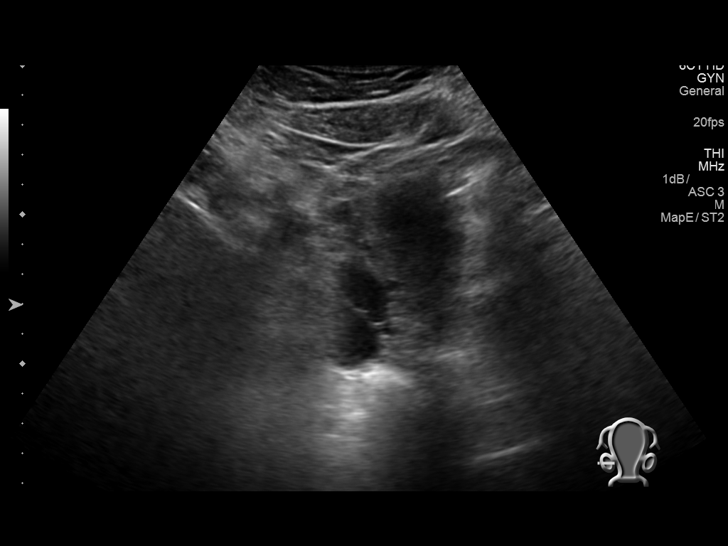
[im 32/94]
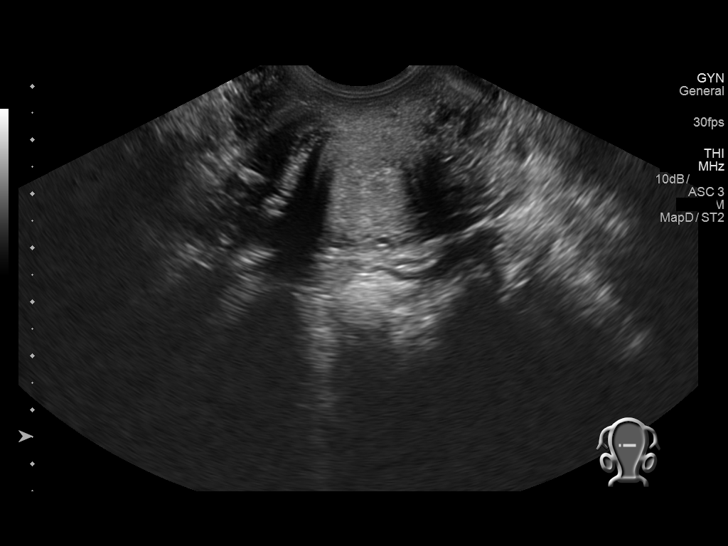
[im 35/94]
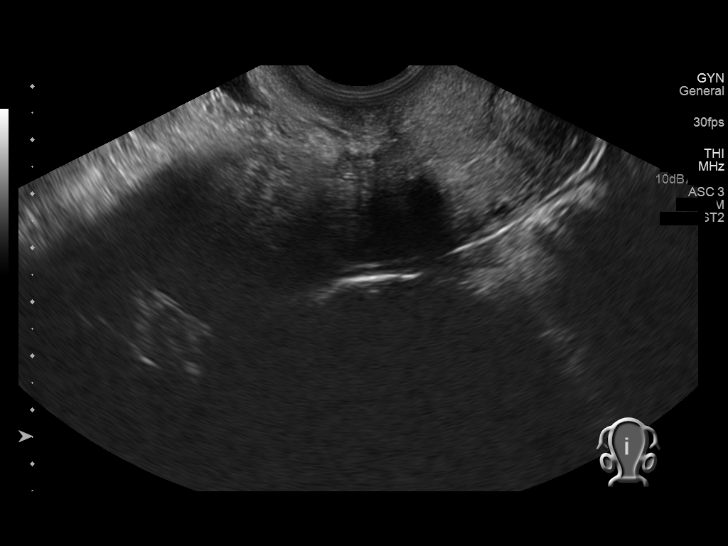
[im 43/94]
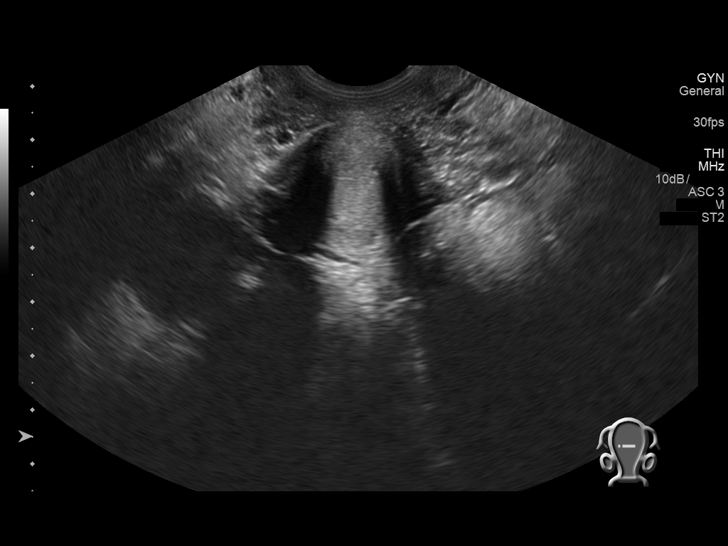
[im 51/94]
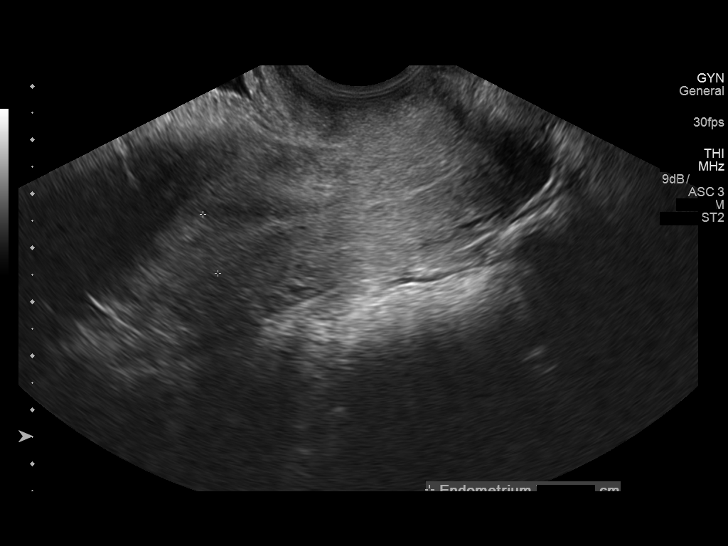
[im 59/94]
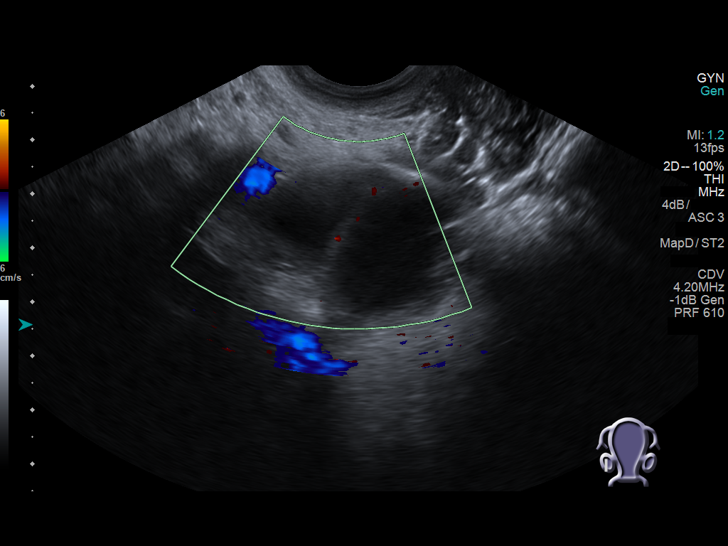
[im 63/94]
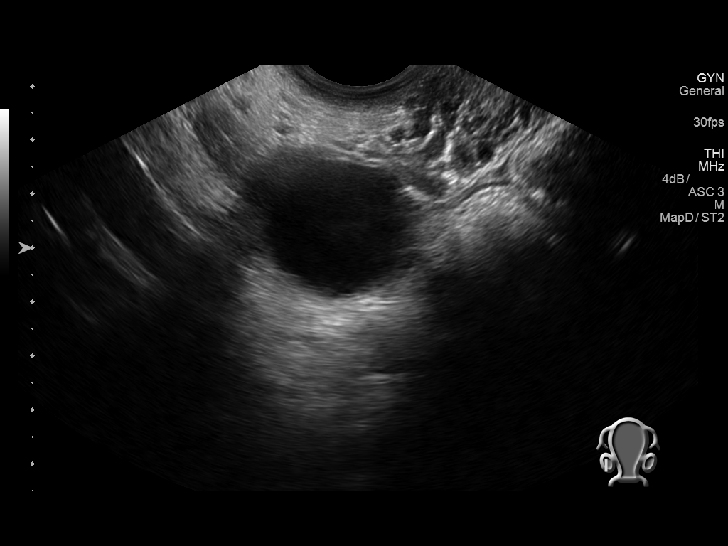
[im 70/94]
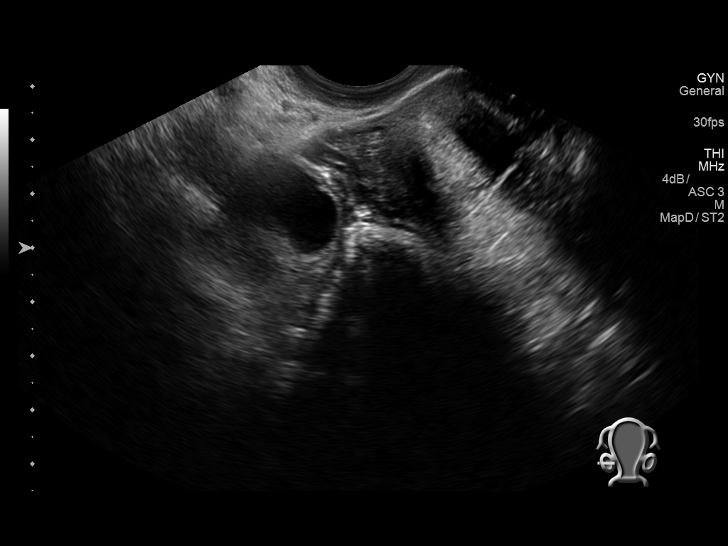
[im 78/94]
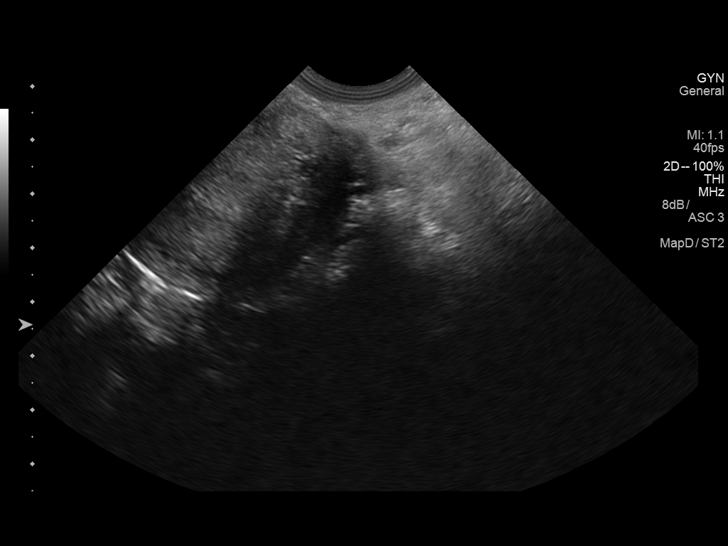
[im 86/94]
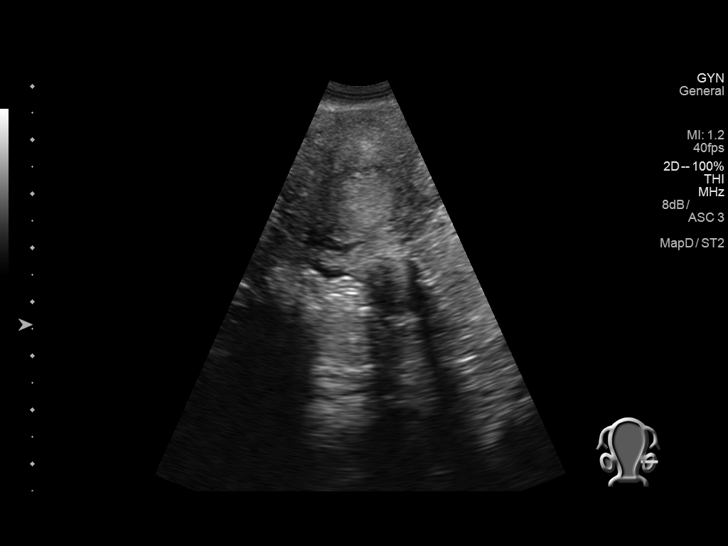
[im 94/94]
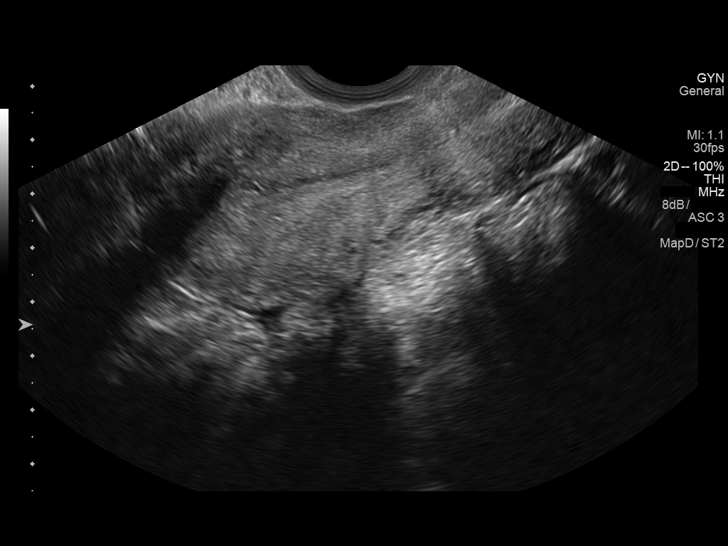

[14 of 25 positions shown; findings below may reference images not displayed]

FINDINGS: Uterus

Measurements: 8.7 x 3.7 x 4.3 cm. No fibroids or other mass
visualized.

Endometrium

Thickness: 1.1 mm.  No focal abnormality visualized.

Right ovary

Measurements: 4.1 x 3.2 x 3.0 cm. Two small simple right ovarian
cysts. Largest measures 2.4 cm in maximum diameter .

Left ovary

Measurements: 1.7 x 1.7 x 1.4 cm. Normal appearance/no adnexal mass.

Other findings

Trace free pelvic fluid.
IMPRESSION: 1. Trace free pelvic fluid.
2. Two small simple right ovarian cysts.

## 2017-04-10 ENCOUNTER — Encounter: Payer: Self-pay | Admitting: Family Medicine

## 2017-04-10 NOTE — Telephone Encounter (Signed)
Routing to provider  

## 2017-04-23 ENCOUNTER — Encounter: Payer: Self-pay | Admitting: Family Medicine

## 2017-04-23 ENCOUNTER — Ambulatory Visit (INDEPENDENT_AMBULATORY_CARE_PROVIDER_SITE_OTHER): Payer: 59 | Admitting: Family Medicine

## 2017-04-23 DIAGNOSIS — F419 Anxiety disorder, unspecified: Secondary | ICD-10-CM | POA: Diagnosis not present

## 2017-04-23 MED ORDER — BUSPIRONE HCL 15 MG PO TABS: 15.0000 mg | ORAL_TABLET | Freq: Two times a day (BID) | ORAL | 2 refills | Status: DC

## 2017-04-23 NOTE — Progress Notes (Signed)
   BP 111/69   Pulse 69   Temp 99.6 F (37.6 C)   Wt 135 lb (61.2 kg)   SpO2 98%   BMI 25.93 kg/m    Subjective:    Patient ID: Jessica Cross, female    DOB: March 24, 1991, 26 y.o.   MRN: [id number]  HPI: Jessica Cross is a 26 y.o. female  Chief Complaint  Patient presents with  . Depression    follow up, doing well on the Buspar  . Anxiety   Patient presents today for anxiety f/u. Started on buspar at previous visit and doing wonderfully with the medication. Has significantly less panic episodes and crying spells now, feels much more even. Has not used the hydroxyzine yet but plans to keep it on hand if needed. Has not started counseling. Denies side effects, CP, SOB, N/V.   Relevant past medical, surgical, family and social history reviewed and updated as indicated. Interim medical history since our last visit reviewed. Allergies and medications reviewed and updated.  Review of Systems  Constitutional: Negative.   Respiratory: Negative.   Cardiovascular: Negative.   Gastrointestinal: Negative.   Musculoskeletal: Negative.   Neurological: Negative.   Psychiatric/Behavioral: Negative.     Per HPI unless specifically indicated above     Objective:    BP 111/69   Pulse 69   Temp 99.6 F (37.6 C)   Wt 135 lb (61.2 kg)   SpO2 98%   BMI 25.93 kg/m   Wt Readings from Last 3 Encounters:  04/23/17 135 lb (61.2 kg)  03/26/17 137 lb (62.1 kg)  12/08/16 143 lb (64.9 kg)    Physical Exam  Constitutional: She is oriented to person, place, and time. She appears well-developed and well-nourished. No distress.  HENT:  Head: Atraumatic.  Eyes: Conjunctivae are normal.  Neck: Normal range of motion. Neck supple.  Cardiovascular: Normal rate and normal heart sounds.  Pulmonary/Chest: Effort normal and breath sounds normal. No respiratory distress.  Musculoskeletal: Normal range of motion.  Neurological: She is alert and oriented to person, place, and time.  Skin: Skin is  warm and dry.  Psychiatric: She has a normal mood and affect. Her behavior is normal.  Nursing note and vitals reviewed.     Assessment & Plan:   Problem List Items Addressed This Visit      Other   Anxiety    Excellent response to buspar. Will increase to 15 mg BID, hydroxyzine prn. Start counseling as soon as possible. F/u in 3 months          Follow up plan: Return in about 3 months (around 07/24/2017) for Anxiety f/u.

## 2017-04-24 ENCOUNTER — Telehealth: Payer: Self-pay

## 2017-04-24 MED ORDER — BUSPIRONE HCL 7.5 MG PO TABS: 15.0000 mg | ORAL_TABLET | Freq: Two times a day (BID) | ORAL | 3 refills | Status: DC

## 2017-04-24 NOTE — Telephone Encounter (Signed)
The Buspirone 15mg  is on manufactures backorder until further notice. They want to know if you will re-write the rx for 7.5mg  and take 2 tabs BID.

## 2017-04-24 NOTE — Telephone Encounter (Signed)
Change made and medication sent

## 2017-04-26 NOTE — Patient Instructions (Signed)
Follow up in 3 months

## 2017-04-26 NOTE — Assessment & Plan Note (Signed)
Excellent response to buspar. Will increase to 15 mg BID, hydroxyzine prn. Start counseling as soon as possible. F/u in 3 months

## 2017-07-24 ENCOUNTER — Ambulatory Visit: Payer: 59 | Admitting: Unknown Physician Specialty

## 2017-08-26 ENCOUNTER — Other Ambulatory Visit: Payer: Self-pay | Admitting: Family Medicine

## 2017-08-27 DIAGNOSIS — Z124 Encounter for screening for malignant neoplasm of cervix: Secondary | ICD-10-CM | POA: Diagnosis not present

## 2017-08-27 DIAGNOSIS — Z1151 Encounter for screening for human papillomavirus (HPV): Secondary | ICD-10-CM | POA: Diagnosis not present

## 2017-08-27 DIAGNOSIS — R102 Pelvic and perineal pain: Secondary | ICD-10-CM | POA: Diagnosis not present

## 2017-08-27 DIAGNOSIS — Z01419 Encounter for gynecological examination (general) (routine) without abnormal findings: Secondary | ICD-10-CM | POA: Diagnosis not present

## 2017-08-31 DIAGNOSIS — N83292 Other ovarian cyst, left side: Secondary | ICD-10-CM | POA: Diagnosis not present

## 2017-08-31 DIAGNOSIS — R102 Pelvic and perineal pain: Secondary | ICD-10-CM | POA: Diagnosis not present

## 2017-08-31 DIAGNOSIS — N83291 Other ovarian cyst, right side: Secondary | ICD-10-CM | POA: Diagnosis not present

## 2017-09-21 ENCOUNTER — Other Ambulatory Visit: Payer: Self-pay | Admitting: Family Medicine

## 2017-09-22 ENCOUNTER — Encounter: Payer: Self-pay | Admitting: Family Medicine

## 2017-09-22 MED ORDER — BUSPIRONE HCL 15 MG PO TABS: 15.0000 mg | ORAL_TABLET | Freq: Two times a day (BID) | ORAL | 1 refills | Status: DC

## 2017-09-24 ENCOUNTER — Other Ambulatory Visit: Payer: Self-pay | Admitting: Family Medicine

## 2017-09-24 MED ORDER — BUSPIRONE HCL 30 MG PO TABS: 15.0000 mg | ORAL_TABLET | Freq: Two times a day (BID) | ORAL | 2 refills | Status: DC

## 2017-10-02 DIAGNOSIS — H52223 Regular astigmatism, bilateral: Secondary | ICD-10-CM | POA: Diagnosis not present

## 2017-10-02 DIAGNOSIS — H5213 Myopia, bilateral: Secondary | ICD-10-CM | POA: Diagnosis not present

## 2017-10-21 DIAGNOSIS — L040 Acute lymphadenitis of face, head and neck: Secondary | ICD-10-CM | POA: Diagnosis not present

## 2017-11-19 ENCOUNTER — Other Ambulatory Visit: Payer: Self-pay | Admitting: Otolaryngology

## 2017-11-19 DIAGNOSIS — R221 Localized swelling, mass and lump, neck: Secondary | ICD-10-CM

## 2017-11-22 ENCOUNTER — Other Ambulatory Visit: Payer: Self-pay | Admitting: Family Medicine

## 2017-11-23 ENCOUNTER — Ambulatory Visit
Admission: RE | Admit: 2017-11-23 | Discharge: 2017-11-23 | Disposition: A | Discharge status: Home / Self Care | Payer: 59 | Source: Ambulatory Visit | Attending: Otolaryngology | Admitting: Otolaryngology

## 2017-11-23 ENCOUNTER — Encounter: Payer: Self-pay | Admitting: Family Medicine

## 2017-11-23 DIAGNOSIS — R221 Localized swelling, mass and lump, neck: Secondary | ICD-10-CM | POA: Diagnosis not present

## 2017-11-23 DIAGNOSIS — K118 Other diseases of salivary glands: Secondary | ICD-10-CM | POA: Diagnosis not present

## 2017-11-23 MED ORDER — IOPAMIDOL (ISOVUE-300) INJECTION 61%
75.0000 mL | Freq: Once | INTRAVENOUS | Status: AC | PRN
  Administered 2017-11-23: 75 mL via INTRAVENOUS

## 2017-11-24 NOTE — Telephone Encounter (Signed)
Interface request refill for Buspar 15mg  BID po #60 1 refill   LOV: 04/23/17 with Merrie Roof  PA_ C  /// Last refill: 10/27/17

## 2017-11-25 ENCOUNTER — Telehealth: Payer: 59 | Admitting: Family

## 2017-11-25 DIAGNOSIS — J029 Acute pharyngitis, unspecified: Secondary | ICD-10-CM | POA: Diagnosis not present

## 2017-11-25 MED ORDER — PREDNISONE 5 MG PO TABS: 5.0000 mg | ORAL_TABLET | ORAL | 0 refills | Status: DC

## 2017-11-25 MED ORDER — BENZONATATE 100 MG PO CAPS: 100.0000 mg | ORAL_CAPSULE | Freq: Three times a day (TID) | ORAL | 0 refills | Status: DC | PRN

## 2017-11-25 NOTE — Progress Notes (Signed)
We are sorry that you are not feeling well.  Here is how we plan to help!  Based on your presentation I believe you most likely have A cough due to a virus.  This is called viral bronchitis and is best treated by rest, plenty of fluids and control of the cough.  You may use Ibuprofen or Tylenol as directed to help your symptoms.     In addition you may use A non-prescription cough medication called Mucinex DM: take 2 tablets every 12 hours. and A prescription cough medication called Tessalon Perles 100mg . You may take 1-2 capsules every 8 hours as needed for your cough.  Prednisone 5 mg daily for 6 days (see taper instructions below)  Directions for 6 day taper: Day 1: 2 tablets before breakfast, 1 after both lunch & dinner and 2 at bedtime Day 2: 1 tab before breakfast, 1 after both lunch & dinner and 2 at bedtime Day 3: 1 tab at each meal & 1 at bedtime Day 4: 1 tab at breakfast, 1 at lunch, 1 at bedtime Day 5: 1 tab at breakfast & 1 tab at bedtime Day 6: 1 tab at breakfast   From your responses in the eVisit questionnaire you describe inflammation in the upper respiratory tract which is causing a significant cough.  This is commonly called Bronchitis and has four common causes:    Allergies  Viral Infections  Acid Reflux  Bacterial Infection Allergies, viruses and acid reflux are treated by controlling symptoms or eliminating the cause. An example might be a cough caused by taking certain blood pressure medications. You stop the cough by changing the medication. Another example might be a cough caused by acid reflux. Controlling the reflux helps control the cough.  USE OF BRONCHODILATOR ("RESCUE") INHALERS: There is a risk from using your bronchodilator too frequently.  The risk is that over-reliance on a medication which only relaxes the muscles surrounding the breathing tubes can reduce the effectiveness of medications prescribed to reduce swelling and congestion of the tubes  themselves.  Although you feel brief relief from the bronchodilator inhaler, your asthma may actually be worsening with the tubes becoming more swollen and filled with mucus.  This can delay other crucial treatments, such as oral steroid medications. If you need to use a bronchodilator inhaler daily, several times per day, you should discuss this with your provider.  There are probably better treatments that could be used to keep your asthma under control.     HOME CARE . Only take medications as instructed by your medical team. . Complete the entire course of an antibiotic. . Drink plenty of fluids and get plenty of rest. . Avoid close contacts especially the very young and the elderly . Cover your mouth if you cough or cough into your sleeve. . Always remember to wash your hands . A steam or ultrasonic humidifier can help congestion.   GET HELP RIGHT AWAY IF: . You develop worsening fever. . You become short of breath . You cough up blood. . Your symptoms persist after you have completed your treatment plan MAKE SURE YOU   Understand these instructions.  Will watch your condition.  Will get help right away if you are not doing well or get worse.  Your e-visit answers were reviewed by a board certified advanced clinical practitioner to complete your personal care plan.  Depending on the condition, your plan could have included both over the counter or prescription medications. If there is a  problem please reply  once you have received a response from your provider. Your safety is important to [place].  If you have drug allergies check your prescription carefully.    You can use MyChart to ask questions about today's visit, request a non-urgent call back, or ask for a work or school excuse for 24 hours related to this e-Visit. If it has been greater than 24 hours you will need to follow up with your provider, or enter a new e-Visit to address those concerns. You will get an e-mail in the next  two days asking about your experience.  I hope that your e-visit has been valuable and will speed your recovery. Thank you for using e-visits.

## 2018-01-24 ENCOUNTER — Other Ambulatory Visit: Payer: Self-pay | Admitting: Family Medicine

## 2018-01-25 NOTE — Telephone Encounter (Signed)
buspirone refill Last Refill:11/24/17 # 60 1 RF Last OV: 04/23/17 PCP: Merrie Roof PA [facility] [address]

## 2018-02-24 DIAGNOSIS — R221 Localized swelling, mass and lump, neck: Secondary | ICD-10-CM | POA: Diagnosis not present

## 2018-04-08 ENCOUNTER — Ambulatory Visit (INDEPENDENT_AMBULATORY_CARE_PROVIDER_SITE_OTHER): Payer: 59 | Admitting: Family Medicine

## 2018-04-08 ENCOUNTER — Other Ambulatory Visit: Payer: Self-pay

## 2018-04-08 ENCOUNTER — Ambulatory Visit: Payer: 59 | Admitting: Family Medicine

## 2018-04-08 ENCOUNTER — Encounter: Payer: Self-pay | Admitting: Family Medicine

## 2018-04-08 VITALS — BP 114/76 | HR 69 | Temp 99.5°F | Ht 60.0 in | Wt 140.0 lb

## 2018-04-08 DIAGNOSIS — F419 Anxiety disorder, unspecified: Secondary | ICD-10-CM | POA: Diagnosis not present

## 2018-04-08 DIAGNOSIS — Z23 Encounter for immunization: Secondary | ICD-10-CM

## 2018-04-08 MED ORDER — BUSPIRONE HCL 15 MG PO TABS: 30.0000 mg | ORAL_TABLET | Freq: Every day | ORAL | 1 refills | Status: DC

## 2018-04-08 NOTE — Progress Notes (Signed)
BP 114/76   Pulse 69   Temp 99.5 F (37.5 C) (Oral)   Ht 5' (1.524 m)   Wt 140 lb (63.5 kg)   SpO2 98%   BMI 27.34 kg/m    Subjective:    Patient ID: Jessica Cross, female    DOB: 03/03/1991, 27 y.o.   MRN: [id number]  HPI: Jessica Cross is a 27 y.o. female  Chief Complaint  Patient presents with  . Anxiety    buspirone refill   Here today for anxiety f/u. Currently taking 30 mg buspar at bedtime and doing very well. Felt foggy when she took it twice daily but does well with this dosing structure. Denies SI/HI, panic episodes, sleep or appetite issues. Feels this are so much better since getting on the medicine.   Depression screen [facility] 2/9 04/08/2018 10/17/2015 01/22/2015  Decreased Interest 0 0 1  Down, Depressed, Hopeless 0 0 0  PHQ - 2 Score 0 0 1  Altered sleeping 1 - 2  Tired, decreased energy 1 - 2  Change in appetite 0 - 1  Feeling bad or failure about yourself  0 - 1  Trouble concentrating 0 - 0  Moving slowly or fidgety/restless 0 - 0  Suicidal thoughts 0 - 0  PHQ-9 Score 2 - 7   GAD 7 : Generalized Anxiety Score 04/08/2018 03/26/2017  Nervous, Anxious, on Edge 1 3  Control/stop worrying 2 3  Worry too much - different things 2 3  Trouble relaxing 0 2  Restless 0 2  Easily annoyed or irritable 1 3  Afraid - awful might happen 2 3  Total GAD 7 Score 8 19  Anxiety Difficulty - Very difficult     Relevant past medical, surgical, family and social history reviewed and updated as indicated. Interim medical history since our last visit reviewed. Allergies and medications reviewed and updated.  Review of Systems  Per HPI unless specifically indicated above     Objective:    BP 114/76   Pulse 69   Temp 99.5 F (37.5 C) (Oral)   Ht 5' (1.524 m)   Wt 140 lb (63.5 kg)   SpO2 98%   BMI 27.34 kg/m   Wt Readings from Last 3 Encounters:  04/08/18 140 lb (63.5 kg)  04/23/17 135 lb (61.2 kg)  03/26/17 137 lb (62.1 kg)    Physical Exam    Constitutional: She is oriented to person, place, and time. She appears well-developed and well-nourished. No distress.  HENT:  Head: Atraumatic.  Eyes: Conjunctivae and EOM are normal.  Neck: Normal range of motion. Neck supple.  Cardiovascular: Normal rate and regular rhythm.  Pulmonary/Chest: Effort normal and breath sounds normal.  Musculoskeletal: Normal range of motion.  Neurological: She is alert and oriented to person, place, and time.  Skin: Skin is warm and dry.  Psychiatric: She has a normal mood and affect. Her behavior is normal.  Nursing note and vitals reviewed.   Results for orders placed or performed in visit on 12/08/16  Microscopic Examination  Result Value Ref Range   WBC, UA 0-5 0 - 5 /hpf   RBC, UA 0-2 0 - 2 /hpf   Epithelial Cells (non renal) >10 (A) 0 - 10 /hpf   Mucus, UA Present (A) Not Estab.   Bacteria, UA None seen None seen/Few  CBC With Differential/Platelet  Result Value Ref Range   WBC 4.7 3.4 - 10.8 x10E3/uL   RBC 4.94 3.77 - 5.28 x10E6/uL  Hemoglobin 15.0 11.1 - 15.9 g/dL   Hematocrit 44.3 34.0 - 46.6 %   MCV 90 79 - 97 fL   MCH 30.4 26.6 - 33.0 pg   MCHC 33.9 31.5 - 35.7 g/dL   RDW 13.1 12.3 - 15.4 %   Platelets 196 150 - 379 x10E3/uL   Neutrophils 78 Not Estab. %   Lymphs 16 Not Estab. %   MID 6 Not Estab. %   Neutrophils Absolute 3.7 1.4 - 7.0 x10E3/uL   Lymphocytes Absolute 0.7 0.7 - 3.1 x10E3/uL   MID (Absolute) 0.3 0.1 - 1.6 X10E3/uL  UA/M w/rflx Culture, Routine  Result Value Ref Range   Specific Gravity, UA 1.025 1.005 - 1.030   pH, UA 6.0 5.0 - 7.5   Color, UA Yellow Yellow   Appearance Ur Cloudy (A) Clear   Leukocytes, UA Negative Negative   Protein, UA 1+ (A) Negative/Trace   Glucose, UA Negative Negative   Ketones, UA Trace (A) Negative   RBC, UA Negative Negative   Bilirubin, UA Negative Negative   Urobilinogen, Ur 4.0 (H) 0.2 - 1.0 mg/dL   Nitrite, UA Negative Negative   Microscopic Examination See below:    Comprehensive metabolic panel  Result Value Ref Range   Glucose 91 65 - 99 mg/dL   BUN 8 6 - 20 mg/dL   Creatinine, Ser 0.62 0.57 - 1.00 mg/dL   GFR calc non Af Amer 124 >59 mL/min/1.73   GFR calc Af Amer 143 >59 mL/min/1.73   BUN/Creatinine Ratio 13 9 - 23   Sodium 140 134 - 144 mmol/L   Potassium 4.2 3.5 - 5.2 mmol/L   Chloride 104 96 - 106 mmol/L   CO2 24 20 - 29 mmol/L   Calcium 9.5 8.7 - 10.2 mg/dL   Total Protein 6.5 6.0 - 8.5 g/dL   Albumin 4.3 3.5 - 5.5 g/dL   Globulin, Total 2.2 1.5 - 4.5 g/dL   Albumin/Globulin Ratio 2.0 1.2 - 2.2   Bilirubin Total 0.4 0.0 - 1.2 mg/dL   Alkaline Phosphatase 58 39 - 117 IU/L   AST 32 0 - 40 IU/L   ALT 20 0 - 32 IU/L  Lipase  Result Value Ref Range   Lipase 26 14 - 72 U/L      Assessment & Plan:   Problem List Items Addressed This Visit      Other   Anxiety - Primary    Significantly improved on buspar. Currently tolerating 30 mg at bedtime very well. Continue current regimen      Relevant Medications   busPIRone (BUSPAR) 15 MG tablet    Other Visit Diagnoses    Flu vaccine need       Relevant Orders   Flu Vaccine QUAD 36+ mos IM (Completed)       Follow up plan: Return in about 6 months (around 10/08/2018) for CPE.

## 2018-04-11 NOTE — Patient Instructions (Signed)
Follow up for CPE 

## 2018-04-11 NOTE — Assessment & Plan Note (Signed)
Significantly improved on buspar. Currently tolerating 30 mg at bedtime very well. Continue current regimen

## 2018-04-19 DIAGNOSIS — R002 Palpitations: Secondary | ICD-10-CM | POA: Diagnosis not present

## 2018-04-30 ENCOUNTER — Encounter: Payer: 59 | Admitting: Family Medicine

## 2018-05-26 DIAGNOSIS — R002 Palpitations: Secondary | ICD-10-CM | POA: Insufficient documentation

## 2018-05-26 DIAGNOSIS — F419 Anxiety disorder, unspecified: Secondary | ICD-10-CM | POA: Diagnosis not present

## 2018-06-04 DIAGNOSIS — R002 Palpitations: Secondary | ICD-10-CM | POA: Diagnosis not present

## 2018-06-28 ENCOUNTER — Encounter: Payer: Self-pay | Admitting: Family Medicine

## 2018-06-28 ENCOUNTER — Telehealth: Payer: 59 | Admitting: Family Medicine

## 2018-06-28 DIAGNOSIS — J019 Acute sinusitis, unspecified: Secondary | ICD-10-CM

## 2018-06-28 DIAGNOSIS — J029 Acute pharyngitis, unspecified: Secondary | ICD-10-CM

## 2018-06-28 DIAGNOSIS — R05 Cough: Secondary | ICD-10-CM | POA: Diagnosis not present

## 2018-06-28 DIAGNOSIS — R0981 Nasal congestion: Secondary | ICD-10-CM

## 2018-06-28 DIAGNOSIS — B9789 Other viral agents as the cause of diseases classified elsewhere: Secondary | ICD-10-CM

## 2018-06-28 DIAGNOSIS — R059 Cough, unspecified: Secondary | ICD-10-CM

## 2018-06-28 MED ORDER — AZELASTINE HCL 0.1 % NA SOLN: 1.0000 | Freq: Two times a day (BID) | NASAL | 12 refills | Status: DC

## 2018-06-28 NOTE — Progress Notes (Signed)

## 2018-07-02 DIAGNOSIS — I491 Atrial premature depolarization: Secondary | ICD-10-CM | POA: Insufficient documentation

## 2018-08-10 ENCOUNTER — Other Ambulatory Visit: Payer: Self-pay

## 2018-08-10 MED ORDER — BUSPIRONE HCL 15 MG PO TABS: 30.0000 mg | ORAL_TABLET | Freq: Every day | ORAL | 1 refills | Status: DC

## 2018-09-14 ENCOUNTER — Encounter: Payer: Self-pay | Admitting: Family Medicine

## 2018-09-15 ENCOUNTER — Other Ambulatory Visit: Payer: Self-pay

## 2018-09-15 ENCOUNTER — Encounter: Payer: Self-pay | Admitting: Family Medicine

## 2018-09-15 ENCOUNTER — Ambulatory Visit (INDEPENDENT_AMBULATORY_CARE_PROVIDER_SITE_OTHER): Payer: No Typology Code available for payment source | Admitting: Family Medicine

## 2018-09-15 VITALS — Temp 99.0°F | Wt 139.0 lb

## 2018-09-15 DIAGNOSIS — R102 Pelvic and perineal pain: Secondary | ICD-10-CM | POA: Insufficient documentation

## 2018-09-15 DIAGNOSIS — Z889 Allergy status to unspecified drugs, medicaments and biological substances status: Secondary | ICD-10-CM | POA: Insufficient documentation

## 2018-09-15 DIAGNOSIS — J029 Acute pharyngitis, unspecified: Secondary | ICD-10-CM | POA: Diagnosis not present

## 2018-09-15 DIAGNOSIS — G8929 Other chronic pain: Secondary | ICD-10-CM | POA: Insufficient documentation

## 2018-09-15 MED ORDER — AMOXICILLIN 875 MG PO TABS: 875.0000 mg | ORAL_TABLET | Freq: Two times a day (BID) | ORAL | 0 refills | Status: DC

## 2018-09-15 NOTE — Progress Notes (Signed)
Temp 99 F (37.2 C) (Oral)   Wt 139 lb (63 kg)   BMI 27.15 kg/m    Subjective:    Patient ID: Jessica Cross, female    DOB: 1991-03-23, 28 y.o.   MRN: [id number]  HPI: Jessica Cross is a 28 y.o. female  Chief Complaint  Patient presents with  . Sore Throat    Started Monday Evening. No exposed to anyone w/ positive testing w/ Flu, Corona or strep  . Generalized Body Aches    has been taking Theraflu  . Fever    . This visit was completed via FaceTime due to the restrictions of the COVID-19 pandemic. All issues as above were discussed and addressed. Physical exam was done as above through visual confirmation on facetime. If it was felt that the patient should be evaluated in the office, they were directed there. The patient verbally consented to this visit. . Location of the patient: home . Location of the provider: work . Those involved with this call:  . Provider: Merrie Roof, PA-C . CMA: Gerda Diss, CMA . Front Desk/Registration: Jill Side  . Time spent on call: 15 minutes with patient face to face via video conference. More than 50% of this time was spent in counseling and coordination of care.     C/o sore throat, headache, low grade fevers, body aches, fatigue x 2 days. Denies N/V/D, CP, SOB, cough, congestion. Taking theraflu and tylenol with minimal relief. No known sick contacts.   Relevant past medical, surgical, family and social history reviewed and updated as indicated. Interim medical history since our last visit reviewed. Allergies and medications reviewed and updated.  Review of Systems  Per HPI unless specifically indicated above     Objective:    Temp 99 F (37.2 C) (Oral)   Wt 139 lb (63 kg)   BMI 27.15 kg/m   Wt Readings from Last 3 Encounters:  09/15/18 139 lb (63 kg)  04/08/18 140 lb (63.5 kg)  04/23/17 135 lb (61.2 kg)    Physical Exam Vitals signs and nursing note reviewed.  Constitutional:      General: She is not in acute  distress.    Appearance: Normal appearance.  HENT:     Head: Atraumatic.     Right Ear: Tympanic membrane and external ear normal.     Left Ear: Tympanic membrane and external ear normal.     Nose: Nose normal. No congestion.     Mouth/Throat:     Mouth: Mucous membranes are moist.     Pharynx: Posterior oropharyngeal erythema present. No oropharyngeal exudate.     Comments: Tonsillar edema b/l, cryptic Eyes:     Extraocular Movements: Extraocular movements intact.     Conjunctiva/sclera: Conjunctivae normal.  Neck:     Musculoskeletal: Normal range of motion.  Cardiovascular:     Comments: Unable to assess via virtual visit Pulmonary:     Effort: Pulmonary effort is normal. No respiratory distress.  Musculoskeletal: Normal range of motion.  Skin:    General: Skin is dry.     Findings: No erythema.  Neurological:     Mental Status: She is alert and oriented to person, place, and time.  Psychiatric:        Mood and Affect: Mood normal.        Thought Content: Thought content normal.        Judgment: Judgment normal.     Results for orders placed or performed in visit on 12/08/16  Microscopic Examination  Result Value Ref Range   WBC, UA 0-5 0 - 5 /hpf   RBC, UA 0-2 0 - 2 /hpf   Epithelial Cells (non renal) >10 (A) 0 - 10 /hpf   Mucus, UA Present (A) Not Estab.   Bacteria, UA None seen None seen/Few  CBC With Differential/Platelet  Result Value Ref Range   WBC 4.7 3.4 - 10.8 x10E3/uL   RBC 4.94 3.77 - 5.28 x10E6/uL   Hemoglobin 15.0 11.1 - 15.9 g/dL   Hematocrit 44.3 34.0 - 46.6 %   MCV 90 79 - 97 fL   MCH 30.4 26.6 - 33.0 pg   MCHC 33.9 31.5 - 35.7 g/dL   RDW 13.1 12.3 - 15.4 %   Platelets 196 150 - 379 x10E3/uL   Neutrophils 78 Not Estab. %   Lymphs 16 Not Estab. %   MID 6 Not Estab. %   Neutrophils Absolute 3.7 1.4 - 7.0 x10E3/uL   Lymphocytes Absolute 0.7 0.7 - 3.1 x10E3/uL   MID (Absolute) 0.3 0.1 - 1.6 X10E3/uL  UA/M w/rflx Culture, Routine  Result Value  Ref Range   Specific Gravity, UA 1.025 1.005 - 1.030   pH, UA 6.0 5.0 - 7.5   Color, UA Yellow Yellow   Appearance Ur Cloudy (A) Clear   Leukocytes, UA Negative Negative   Protein, UA 1+ (A) Negative/Trace   Glucose, UA Negative Negative   Ketones, UA Trace (A) Negative   RBC, UA Negative Negative   Bilirubin, UA Negative Negative   Urobilinogen, Ur 4.0 (H) 0.2 - 1.0 mg/dL   Nitrite, UA Negative Negative   Microscopic Examination See below:   Comprehensive metabolic panel  Result Value Ref Range   Glucose 91 65 - 99 mg/dL   BUN 8 6 - 20 mg/dL   Creatinine, Ser 0.62 0.57 - 1.00 mg/dL   GFR calc non Af Amer 124 >59 mL/min/1.73   GFR calc Af Amer 143 >59 mL/min/1.73   BUN/Creatinine Ratio 13 9 - 23   Sodium 140 134 - 144 mmol/L   Potassium 4.2 3.5 - 5.2 mmol/L   Chloride 104 96 - 106 mmol/L   CO2 24 20 - 29 mmol/L   Calcium 9.5 8.7 - 10.2 mg/dL   Total Protein 6.5 6.0 - 8.5 g/dL   Albumin 4.3 3.5 - 5.5 g/dL   Globulin, Total 2.2 1.5 - 4.5 g/dL   Albumin/Globulin Ratio 2.0 1.2 - 2.2   Bilirubin Total 0.4 0.0 - 1.2 mg/dL   Alkaline Phosphatase 58 39 - 117 IU/L   AST 32 0 - 40 IU/L   ALT 20 0 - 32 IU/L  Lipase  Result Value Ref Range   Lipase 26 14 - 72 U/L      Assessment & Plan:   Problem List Items Addressed This Visit    None    Visit Diagnoses    Pharyngitis, unspecified etiology    -  Primary   Unable to swab due to virtual visit with COVID restrictions, tx based on sxs with amoxil, motrin, supportive care. Return precautions given       Follow up plan: Return if symptoms worsen or fail to improve.

## 2018-10-10 ENCOUNTER — Other Ambulatory Visit: Payer: Self-pay | Admitting: Family Medicine

## 2018-11-23 ENCOUNTER — Other Ambulatory Visit: Payer: Self-pay | Admitting: Family Medicine

## 2018-12-22 ENCOUNTER — Other Ambulatory Visit: Payer: Self-pay | Admitting: Family Medicine

## 2018-12-22 NOTE — Telephone Encounter (Signed)
Pt should have enough refills until Sept

## 2019-01-07 ENCOUNTER — Telehealth: Payer: No Typology Code available for payment source | Admitting: Nurse Practitioner

## 2019-01-07 DIAGNOSIS — L03116 Cellulitis of left lower limb: Secondary | ICD-10-CM

## 2019-01-07 MED ORDER — SULFAMETHOXAZOLE-TRIMETHOPRIM 800-160 MG PO TABS: 1.0000 | ORAL_TABLET | Freq: Two times a day (BID) | ORAL | 0 refills | Status: DC

## 2019-01-07 NOTE — Progress Notes (Signed)
E Visit for Cellulitis  We are sorry that you are not feeling well. Here is how we plan to help!  Based on what you shared with me it looks like you have cellulitis.  Cellulitis looks like areas of skin redness, swelling, and warmth; it develops as a result of bacteria entering under the skin. Little red spots and/or bleeding can be seen in skin, and tiny surface sacs containing fluid can occur. Fever can be present. Cellulitis is almost always on one side of a body, and the lower limbs are the most common site of involvement.   I have prescribed:  Bactrim DS 1 tablet by mouth BID for 7 days  HOME CARE:  . Take your medications as ordered and take all of them, even if the skin irritation appears to be healing.   GET HELP RIGHT AWAY IF:  . Symptoms that don't begin to go away within 48 hours. . Severe redness persists or worsens . If the area turns color, spreads or swells. . If it blisters and opens, develops yellow-brown crust or bleeds. . You develop a fever or chills. . If the pain increases or becomes unbearable.  . Are unable to keep fluids and food down.  MAKE SURE YOU    Understand these instructions.  Will watch your condition.  Will get help right away if you are not doing well or get worse.  Thank you for choosing an e-visit. Your e-visit answers were reviewed by a board certified advanced clinical practitioner to complete your personal care plan. Depending upon the condition, your plan could have included both over the counter or prescription medications. Please review your pharmacy choice. Make sure the pharmacy is open so you can pick up prescription now. If there is a problem, you may contact your provider through [employer] and have the prescription routed to another pharmacy. Your safety is important to [place]. If you have drug allergies check your prescription carefully.  For the next 24 hours you can use MyChart to ask questions about today's visit, request a  non-urgent call back, or ask for a work or school excuse. You will get an email in the next two days asking about your experience. I hope that your e-visit has been valuable and will speed your recovery.  5-10 minutes spent reviewing and documenting in chart.

## 2019-01-15 ENCOUNTER — Telehealth: Payer: No Typology Code available for payment source | Admitting: Physician Assistant

## 2019-01-15 DIAGNOSIS — T50905A Adverse effect of unspecified drugs, medicaments and biological substances, initial encounter: Secondary | ICD-10-CM | POA: Diagnosis not present

## 2019-01-15 MED ORDER — PREDNISONE 10 MG (21) PO TBPK: ORAL_TABLET | ORAL | 0 refills | Status: DC

## 2019-01-15 NOTE — Progress Notes (Signed)
E Visit for Rash  We are sorry that you are not feeling well. Here is how we plan to help!  Based on what you shared with me you may have an allergic reaction.  Stop the antibiotic you are taking. Continue Benadryl at home. I am sending in a steroid taper to take as directed below. Please contact your PCP for further management of cellulitis.  Directions for 6 day taper: Day 1: 2 tablets before breakfast, 1 after both lunch & dinner and 2 at bedtime Day 2: 1 tab before breakfast, 1 after both lunch & dinner and 2 at bedtime Day 3: 1 tab at each meal & 1 at bedtime Day 4: 1 tab at breakfast, 1 at lunch, 1 at bedtime Day 5: 1 tab at breakfast & 1 tab at bedtime Day 6: 1 tab at breakfast    HOME CARE:   Take cool showers and avoid direct sunlight.  Apply cool compress or wet dressings.  Take a bath in an oatmeal bath.  Sprinkle content of one Aveeno packet under running faucet with comfortably warm water.  Bathe for 15-20 minutes, 1-2 times daily.  Pat dry with a towel. Do not rub the rash.  Use hydrocortisone cream.  Take an antihistamine like Benadryl for widespread rashes that itch.  The adult dose of Benadryl is 25-50 mg by mouth 4 times daily.  Caution:  This type of medication may cause sleepiness.  Do not drink alcohol, drive, or operate dangerous machinery while taking antihistamines.  Do not take these medications if you have prostate enlargement.  Read package instructions thoroughly on all medications that you take.  GET HELP RIGHT AWAY IF:   Symptoms don't go away after treatment.  Severe itching that persists.  If you rash spreads or swells.  If you rash begins to smell.  If it blisters and opens or develops a yellow-brown crust.  You develop a fever.  You have a sore throat.  You become short of breath.  MAKE SURE YOU:  Understand these instructions. Will watch your condition. Will get help right away if you are not doing well or get worse.  Thank  you for choosing an e-visit. Your e-visit answers were reviewed by a board certified advanced clinical practitioner to complete your personal care plan. Depending upon the condition, your plan could have included both over the counter or prescription medications. Please review your pharmacy choice. Be sure that the pharmacy you have chosen is open so that you can pick up your prescription now.  If there is a problem you may message your provider in [place] to have the prescription routed to another pharmacy. Your safety is important to [place]. If you have drug allergies check your prescription carefully.  For the next 24 hours, you can use MyChart to ask questions about today's visit, request a non-urgent call back, or ask for a work or school excuse from your e-visit provider. You will get an email in the next two days asking about your experience. I hope that your e-visit has been valuable and will speed your recovery.

## 2019-01-15 NOTE — Progress Notes (Signed)
I have spent 5 minutes in review of e-visit questionnaire, review and updating patient chart, medical decision making and response to patient.   Jaideep Pollack Cody Sherissa Tenenbaum, PA-C    

## 2019-01-18 ENCOUNTER — Other Ambulatory Visit: Payer: Self-pay | Admitting: Family Medicine

## 2019-01-26 ENCOUNTER — Ambulatory Visit (INDEPENDENT_AMBULATORY_CARE_PROVIDER_SITE_OTHER): Payer: No Typology Code available for payment source | Admitting: Family Medicine

## 2019-01-26 ENCOUNTER — Other Ambulatory Visit: Payer: Self-pay

## 2019-01-26 ENCOUNTER — Encounter: Payer: Self-pay | Admitting: Family Medicine

## 2019-01-26 VITALS — Temp 98.0°F | Ht 60.0 in | Wt 140.0 lb

## 2019-01-26 DIAGNOSIS — J029 Acute pharyngitis, unspecified: Secondary | ICD-10-CM | POA: Diagnosis not present

## 2019-01-26 DIAGNOSIS — R0981 Nasal congestion: Secondary | ICD-10-CM | POA: Diagnosis not present

## 2019-01-26 DIAGNOSIS — L989 Disorder of the skin and subcutaneous tissue, unspecified: Secondary | ICD-10-CM

## 2019-01-26 MED ORDER — DOXYCYCLINE HYCLATE 100 MG PO TABS: 100.0000 mg | ORAL_TABLET | Freq: Two times a day (BID) | ORAL | 0 refills | Status: DC

## 2019-01-26 NOTE — Progress Notes (Signed)
Temp 98 F (36.7 C) (Oral)   Ht 5' (1.524 m)   Wt 140 lb (63.5 kg)   BMI 27.34 kg/m    Subjective:    Patient ID: Jessica Cross, female    DOB: April 13, 1991, 28 y.o.   MRN: [id number]  HPI: Jessica Cross is a 28 y.o. female  Chief Complaint  Patient presents with  . Sinusitis    Started off with sore throat 3 days ago. Sore throat is better, but has lots of sinus pressure.   . Chest Pain    Not having chest pain, but chest feels tight.  . Insect Bite    Insect bite got infected and went away, bump returned this week.   . Nasal Congestion    . This visit was completed via WebEx due to the restrictions of the COVID-19 pandemic. All issues as above were discussed and addressed. Physical exam was done as above through visual confirmation on WebEx. If it was felt that the patient should be evaluated in the office, they were directed there. The patient verbally consented to this visit. . Location of the patient: home . Location of the provider: home . Those involved with this call:  . Provider: Merrie Roof, PA-C . CMA: Merilyn Baba, [place] . Front Desk/Registration: Jill Side  . Time spent on call: 15 minutes with patient face to face via video conference. More than 50% of this time was spent in counseling and coordination of care. 5 minutes total spent in review of patient's record and preparation of their chart. I verified patient identity using two factors (patient name and date of birth). Patient consents verbally to being seen via telemedicine visit today.   3 days of sore throat, swollen and tender glands in neck, and now sinus pain and pressure behind eyes and nose, headache, b/l ear pressure. Also had a day or so of chest tightness but that has since gone away. Today feeling better than previous few days. Denies fever, chills, CP, SOB, recent travel, sick contacts known. Taking OTC sinus medication with minimal relief.   Got a mosquito bite on left leg 1 month ago, turned  into cellulitis and given bactrim but had allergic reaction to it. Given prednisone for the reaction and the issue resolved. Now that spot has come back up. Has been using neosporin wihtout much relief.   Relevant past medical, surgical, family and social history reviewed and updated as indicated. Interim medical history since our last visit reviewed. Allergies and medications reviewed and updated.  Review of Systems  Per HPI unless specifically indicated above     Objective:    Temp 98 F (36.7 C) (Oral)   Ht 5' (1.524 m)   Wt 140 lb (63.5 kg)   BMI 27.34 kg/m   Wt Readings from Last 3 Encounters:  01/26/19 140 lb (63.5 kg)  09/15/18 139 lb (63 kg)  04/08/18 140 lb (63.5 kg)    Physical Exam Vitals signs and nursing note reviewed.  Constitutional:      General: She is not in acute distress.    Appearance: Normal appearance.  HENT:     Head: Atraumatic.     Right Ear: External ear normal.     Left Ear: External ear normal.     Nose: Congestion present.     Mouth/Throat:     Mouth: Mucous membranes are moist.     Pharynx: Oropharynx is clear. Posterior oropharyngeal erythema present.  Eyes:     Extraocular  Movements: Extraocular movements intact.     Conjunctiva/sclera: Conjunctivae normal.  Neck:     Musculoskeletal: Normal range of motion.  Cardiovascular:     Comments: Unable to assess via virtual visit Pulmonary:     Effort: Pulmonary effort is normal. No respiratory distress.  Musculoskeletal: Normal range of motion.  Skin:    General: Skin is dry.     Findings: No erythema.     Comments: Erythematous, edematous bug bite of lower leg. No active drainage. TTP per patient's self exam  Neurological:     Mental Status: She is alert and oriented to person, place, and time.  Psychiatric:        Mood and Affect: Mood normal.        Thought Content: Thought content normal.        Judgment: Judgment normal.     Results for orders placed or performed in visit on  12/08/16  Microscopic Examination   URINE  Result Value Ref Range   WBC, UA 0-5 0 - 5 /hpf   RBC, UA 0-2 0 - 2 /hpf   Epithelial Cells (non renal) >10 (A) 0 - 10 /hpf   Mucus, UA Present (A) Not Estab.   Bacteria, UA None seen None seen/Few  CBC With Differential/Platelet  Result Value Ref Range   WBC 4.7 3.4 - 10.8 x10E3/uL   RBC 4.94 3.77 - 5.28 x10E6/uL   Hemoglobin 15.0 11.1 - 15.9 g/dL   Hematocrit 44.3 34.0 - 46.6 %   MCV 90 79 - 97 fL   MCH 30.4 26.6 - 33.0 pg   MCHC 33.9 31.5 - 35.7 g/dL   RDW 13.1 12.3 - 15.4 %   Platelets 196 150 - 379 x10E3/uL   Neutrophils 78 Not Estab. %   Lymphs 16 Not Estab. %   MID 6 Not Estab. %   Neutrophils Absolute 3.7 1.4 - 7.0 x10E3/uL   Lymphocytes Absolute 0.7 0.7 - 3.1 x10E3/uL   MID (Absolute) 0.3 0.1 - 1.6 X10E3/uL  UA/M w/rflx Culture, Routine   Specimen: Urine   URINE  Result Value Ref Range   Specific Gravity, UA 1.025 1.005 - 1.030   pH, UA 6.0 5.0 - 7.5   Color, UA Yellow Yellow   Appearance Ur Cloudy (A) Clear   Leukocytes, UA Negative Negative   Protein, UA 1+ (A) Negative/Trace   Glucose, UA Negative Negative   Ketones, UA Trace (A) Negative   RBC, UA Negative Negative   Bilirubin, UA Negative Negative   Urobilinogen, Ur 4.0 (H) 0.2 - 1.0 mg/dL   Nitrite, UA Negative Negative   Microscopic Examination See below:   Comprehensive metabolic panel  Result Value Ref Range   Glucose 91 65 - 99 mg/dL   BUN 8 6 - 20 mg/dL   Creatinine, Ser 0.62 0.57 - 1.00 mg/dL   GFR calc non Af Amer 124 >59 mL/min/1.73   GFR calc Af Amer 143 >59 mL/min/1.73   BUN/Creatinine Ratio 13 9 - 23   Sodium 140 134 - 144 mmol/L   Potassium 4.2 3.5 - 5.2 mmol/L   Chloride 104 96 - 106 mmol/L   CO2 24 20 - 29 mmol/L   Calcium 9.5 8.7 - 10.2 mg/dL   Total Protein 6.5 6.0 - 8.5 g/dL   Albumin 4.3 3.5 - 5.5 g/dL   Globulin, Total 2.2 1.5 - 4.5 g/dL   Albumin/Globulin Ratio 2.0 1.2 - 2.2   Bilirubin Total 0.4 0.0 - 1.2 mg/dL   Alkaline  Phosphatase 58 39 - 117 IU/L   AST 32 0 - 40 IU/L   ALT 20 0 - 32 IU/L  Lipase  Result Value Ref Range   Lipase 26 14 - 72 U/L      Assessment & Plan:   Problem List Items Addressed This Visit    None    Visit Diagnoses    Sore throat    -  Primary   Will refer for COVID 19 testing and start doxycycline for bug bite and possible sinusitis. Supportive care and OTC remedies reviewed. F/u if not improving   Relevant Orders   Novel Coronavirus, NAA ([facility])   Sinus congestion       Relevant Orders   Novel Coronavirus, NAA ([facility])   Skin lesion       Infected bug bite, tx with doxycycline, continue topical wound care. Keep covered. F/u if not improving       Follow up plan: Return if symptoms worsen or fail to improve.

## 2019-01-30 ENCOUNTER — Encounter: Payer: Self-pay | Admitting: Family Medicine

## 2019-01-31 ENCOUNTER — Other Ambulatory Visit: Payer: Self-pay | Admitting: Family Medicine

## 2019-01-31 MED ORDER — AZITHROMYCIN 250 MG PO TABS: ORAL_TABLET | ORAL | 0 refills | Status: DC

## 2019-02-17 ENCOUNTER — Other Ambulatory Visit: Payer: Self-pay | Admitting: Family Medicine

## 2019-02-17 NOTE — Telephone Encounter (Signed)
Requested medication (s) are due for refill today: yes  Requested medication (s) are on the active medication list: yes  Last refill: 01/18/2019  Future visit scheduled: no  Notes to clinic:  Review for refill   Requested Prescriptions  Pending Prescriptions Disp Refills   busPIRone (BUSPAR) 15 MG tablet [Pharmacy Med Name: BUSPIRONE HCL 15 MG TABLET] 180 tablet 1    Sig: TAKE 2 TABLETS (30 MG TOTAL) BY MOUTH AT BEDTIME.     Psychiatry: Anxiolytics/Hypnotics - Non-controlled Passed - 02/17/2019  9:30 AM      Passed - Valid encounter within last 6 months    Recent Outpatient Visits          3 weeks ago Sore throat   [facility] Merrie Roof [place], [place]   5 months ago Pharyngitis, unspecified etiology   [facility] Merrie Roof [place], [place]   10 months ago [place], Rachel [place], [place]   1 year ago [place], Rachel [place], [place]   1 year ago Moderate episode of recurrent major depressive disorder [facility])   [facility] Volney [place], [place]

## 2019-04-15 ENCOUNTER — Ambulatory Visit (INDEPENDENT_AMBULATORY_CARE_PROVIDER_SITE_OTHER): Payer: No Typology Code available for payment source

## 2019-04-15 ENCOUNTER — Other Ambulatory Visit: Payer: Self-pay

## 2019-04-15 DIAGNOSIS — Z23 Encounter for immunization: Secondary | ICD-10-CM | POA: Diagnosis not present

## 2019-06-12 ENCOUNTER — Other Ambulatory Visit: Payer: Self-pay | Admitting: Family Medicine

## 2019-07-08 ENCOUNTER — Ambulatory Visit (INDEPENDENT_AMBULATORY_CARE_PROVIDER_SITE_OTHER): Payer: No Typology Code available for payment source | Admitting: Family Medicine

## 2019-07-08 ENCOUNTER — Other Ambulatory Visit: Payer: Self-pay

## 2019-07-08 ENCOUNTER — Encounter: Payer: Self-pay | Admitting: Family Medicine

## 2019-07-08 VITALS — BP 126/85 | HR 90 | Temp 99.7°F | Ht 60.0 in | Wt 150.0 lb

## 2019-07-08 DIAGNOSIS — R1013 Epigastric pain: Secondary | ICD-10-CM | POA: Diagnosis not present

## 2019-07-08 MED ORDER — PANTOPRAZOLE SODIUM 40 MG PO TBEC: 40.0000 mg | DELAYED_RELEASE_TABLET | Freq: Every day | ORAL | 0 refills | Status: DC

## 2019-07-08 MED ORDER — CYCLOBENZAPRINE HCL 10 MG PO TABS: 10.0000 mg | ORAL_TABLET | Freq: Every evening | ORAL | 0 refills | Status: DC | PRN

## 2019-07-08 NOTE — Patient Instructions (Signed)
Belly Breathing exercises - can youtube

## 2019-07-08 NOTE — Progress Notes (Signed)
BP 126/85   Pulse 90   Temp 99.7 F (37.6 C) (Oral)   Ht 5' (1.524 m)   Wt 150 lb (68 kg)   SpO2 99%   BMI 29.29 kg/m    Subjective:    Patient ID: Jessica Cross, female    DOB: 10-Nov-1990, 29 y.o.   MRN: [id number]  HPI: Jessica Cross is a 29 y.o. female  Chief Complaint  Patient presents with  . Abdominal Pain    pt states has had abdominal pain radiating towars her bilateral rib cage and back x a few months now   Several months of sharp epigastric abdominal pain that radiates around to ribs, worst in the morning when waking up out of bed. Notes it's worsened by deep, intentional breaths. Seems to work itself out as she gets up and moving throughout the day. Trying ibuprofen which does seem to help mask it temporarily until it dissipates later in the day. Denies any association with foods/eating, no N/V/D, fevers, injuries, urinary sxs.   Relevant past medical, surgical, family and social history reviewed and updated as indicated. Interim medical history since our last visit reviewed. Allergies and medications reviewed and updated.  Review of Systems  Per HPI unless specifically indicated above     Objective:    BP 126/85   Pulse 90   Temp 99.7 F (37.6 C) (Oral)   Ht 5' (1.524 m)   Wt 150 lb (68 kg)   SpO2 99%   BMI 29.29 kg/m   Wt Readings from Last 3 Encounters:  07/08/19 150 lb (68 kg)  01/26/19 140 lb (63.5 kg)  09/15/18 139 lb (63 kg)    Physical Exam Vitals and nursing note reviewed.  Constitutional:      Appearance: Normal appearance. She is not ill-appearing.  HENT:     Head: Atraumatic.  Eyes:     Extraocular Movements: Extraocular movements intact.     Conjunctiva/sclera: Conjunctivae normal.  Cardiovascular:     Rate and Rhythm: Normal rate and regular rhythm.     Heart sounds: Normal heart sounds.  Pulmonary:     Effort: Pulmonary effort is normal.     Breath sounds: Normal breath sounds.  Abdominal:     General: Bowel sounds are  normal. There is no distension.     Palpations: Abdomen is soft. There is no mass.     Tenderness: There is no abdominal tenderness. There is no right CVA tenderness, left CVA tenderness or guarding.  Musculoskeletal:        General: Normal range of motion.     Cervical back: Normal range of motion and neck supple.  Skin:    General: Skin is warm and dry.  Neurological:     Mental Status: She is alert and oriented to person, place, and time.  Psychiatric:        Mood and Affect: Mood normal.        Thought Content: Thought content normal.        Judgment: Judgment normal.     Results for orders placed or performed in visit on 07/08/19  Comprehensive metabolic panel  Result Value Ref Range   Glucose 106 (H) 65 - 99 mg/dL   BUN 9 6 - 20 mg/dL   Creatinine, Ser 0.90 0.57 - 1.00 mg/dL   GFR calc non Af Amer 86 >59 mL/min/1.73   GFR calc Af Amer 99 >59 mL/min/1.73   BUN/Creatinine Ratio 10 9 - 23   Sodium  140 134 - 144 mmol/L   Potassium 4.2 3.5 - 5.2 mmol/L   Chloride 104 96 - 106 mmol/L   CO2 22 20 - 29 mmol/L   Calcium 9.8 8.7 - 10.2 mg/dL   Total Protein 7.0 6.0 - 8.5 g/dL   Albumin 4.7 3.9 - 5.0 g/dL   Globulin, Total 2.3 1.5 - 4.5 g/dL   Albumin/Globulin Ratio 2.0 1.2 - 2.2   Bilirubin Total 0.4 0.0 - 1.2 mg/dL   Alkaline Phosphatase 57 39 - 117 IU/L   AST 16 0 - 40 IU/L   ALT 10 0 - 32 IU/L  CBC with Differential/Platelet out  Result Value Ref Range   WBC 5.7 3.4 - 10.8 x10E3/uL   RBC 4.63 3.77 - 5.28 x10E6/uL   Hemoglobin 14.4 11.1 - 15.9 g/dL   Hematocrit 41.8 34.0 - 46.6 %   MCV 90 79 - 97 fL   MCH 31.1 26.6 - 33.0 pg   MCHC 34.4 31.5 - 35.7 g/dL   RDW 12.0 11.7 - 15.4 %   Platelets 201 150 - 450 x10E3/uL   Neutrophils 61 Not Estab. %   Lymphs 31 Not Estab. %   Monocytes 6 Not Estab. %   Eos 1 Not Estab. %   Basos 1 Not Estab. %   Neutrophils Absolute 3.5 1.4 - 7.0 x10E3/uL   Lymphocytes Absolute 1.7 0.7 - 3.1 x10E3/uL   Monocytes Absolute 0.4 0.1 - 0.9  x10E3/uL   EOS (ABSOLUTE) 0.1 0.0 - 0.4 x10E3/uL   Basophils Absolute 0.0 0.0 - 0.2 x10E3/uL   Immature Granulocytes 0 Not Estab. %   Immature Grans (Abs) 0.0 0.0 - 0.1 x10E3/uL  Lipase  Result Value Ref Range   Lipase 33 14 - 72 U/L      Assessment & Plan:   Problem List Items Addressed This Visit    None    Visit Diagnoses    Epigastric pain    -  Primary   Relevant Orders   Comprehensive metabolic panel (Completed)   CBC with Differential/Platelet out (Completed)   Lipase (Completed)      Suspect pain is from irritation of diaphragm, but cannot r/o gastritis. Exam and vitals benign today (patient states pain has already dissipated for the day. Will obtain labs for reassurance and start belly breathing exercises, protonix, bland foods. F/u if worsening or not improving  Follow up plan: Return for CPE.

## 2019-07-09 LAB — CBC WITH DIFFERENTIAL/PLATELET
Basophils Absolute: 0 10*3/uL (ref 0.0–0.2)
Basos: 1 %
EOS (ABSOLUTE): 0.1 10*3/uL (ref 0.0–0.4)
Eos: 1 %
Hematocrit: 41.8 % (ref 34.0–46.6)
Hemoglobin: 14.4 g/dL (ref 11.1–15.9)
Immature Grans (Abs): 0 10*3/uL (ref 0.0–0.1)
Immature Granulocytes: 0 %
Lymphocytes Absolute: 1.7 10*3/uL (ref 0.7–3.1)
Lymphs: 31 %
MCH: 31.1 pg (ref 26.6–33.0)
MCHC: 34.4 g/dL (ref 31.5–35.7)
MCV: 90 fL (ref 79–97)
Monocytes Absolute: 0.4 10*3/uL (ref 0.1–0.9)
Monocytes: 6 %
Neutrophils Absolute: 3.5 10*3/uL (ref 1.4–7.0)
Neutrophils: 61 %
Platelets: 201 10*3/uL (ref 150–450)
RBC: 4.63 x10E6/uL (ref 3.77–5.28)
RDW: 12 % (ref 11.7–15.4)
WBC: 5.7 10*3/uL (ref 3.4–10.8)

## 2019-07-09 LAB — LIPASE: Lipase: 33 U/L (ref 14–72)

## 2019-07-09 LAB — COMPREHENSIVE METABOLIC PANEL
ALT: 10 IU/L (ref 0–32)
AST: 16 IU/L (ref 0–40)
Albumin/Globulin Ratio: 2 (ref 1.2–2.2)
Albumin: 4.7 g/dL (ref 3.9–5.0)
Alkaline Phosphatase: 57 IU/L (ref 39–117)
BUN/Creatinine Ratio: 10 (ref 9–23)
BUN: 9 mg/dL (ref 6–20)
Bilirubin Total: 0.4 mg/dL (ref 0.0–1.2)
CO2: 22 mmol/L (ref 20–29)
Calcium: 9.8 mg/dL (ref 8.7–10.2)
Chloride: 104 mmol/L (ref 96–106)
Creatinine, Ser: 0.9 mg/dL (ref 0.57–1.00)
GFR calc Af Amer: 99 mL/min/{1.73_m2} (ref >59)
GFR calc non Af Amer: 86 mL/min/{1.73_m2} (ref >59)
Globulin, Total: 2.3 g/dL (ref 1.5–4.5)
Glucose: 106 mg/dL — ABNORMAL HIGH (ref 65–99)
Potassium: 4.2 mmol/L (ref 3.5–5.2)
Sodium: 140 mmol/L (ref 134–144)
Total Protein: 7 g/dL (ref 6.0–8.5)

## 2019-07-11 ENCOUNTER — Other Ambulatory Visit: Payer: Self-pay | Admitting: Family Medicine

## 2019-07-12 ENCOUNTER — Other Ambulatory Visit: Payer: Self-pay | Admitting: Family Medicine

## 2019-07-12 NOTE — Telephone Encounter (Signed)
Requested medication (s) are due for refill today: yes  Requested medication (s) are on the active medication list: yes  Last refill:  06/19/2019  Future visit scheduled: no  Notes to clinic:  pharmacy note states appt needed for refills    Requested Prescriptions  Pending Prescriptions Disp Refills   busPIRone (BUSPAR) 15 MG tablet [Pharmacy Med Name: BUSPIRONE HCL 15 MG TABLET] 60 tablet 0    Sig: TAKE 2 TABLETS (30 MG TOTAL) BY MOUTH AT BEDTIME. * NEEDS APPT FOR FURTHER FILLS*      Psychiatry: Anxiolytics/Hypnotics - Non-controlled Passed - 07/12/2019 12:09 PM      Passed - Valid encounter within last 6 months    Recent Outpatient Visits           4 days ago Epigastric pain   [place], [place], [place]   5 months ago Sore throat   [facility] Merrie Roof [place], [place]   10 months ago Pharyngitis, unspecified etiology   [facility] Volney American, [place]   1 year ago [place], Rachel [place], [place]   2 years ago [place], [place], [place]

## 2019-07-25 ENCOUNTER — Encounter: Payer: Self-pay | Admitting: Family Medicine

## 2019-07-28 ENCOUNTER — Other Ambulatory Visit: Payer: Self-pay | Admitting: Family Medicine

## 2019-07-28 DIAGNOSIS — R1010 Upper abdominal pain, unspecified: Secondary | ICD-10-CM

## 2019-07-30 ENCOUNTER — Other Ambulatory Visit: Payer: Self-pay | Admitting: Family Medicine

## 2019-07-30 NOTE — Telephone Encounter (Signed)
Requested Prescriptions  Pending Prescriptions Disp Refills  . pantoprazole (PROTONIX) 40 MG tablet [Pharmacy Med Name: PANTOPRAZOLE SOD DR 40 MG TAB] 30 tablet 0    Sig: TAKE 1 TABLET BY MOUTH EVERY DAY     Gastroenterology: Proton Pump Inhibitors Passed - 07/30/2019  9:34 AM      Passed - Valid encounter within last 12 months    Recent Outpatient Visits          3 weeks ago Epigastric pain   [place], [place]   6 months ago Sore throat   [facility] Merrie Roof [place], [place]   10 months ago Pharyngitis, unspecified etiology   [facility] Volney American, [place]   1 year ago [place], Rachel [place], [place]   2 years ago [place], [place], [place]

## 2019-08-04 ENCOUNTER — Ambulatory Visit: Payer: No Typology Code available for payment source

## 2019-08-16 ENCOUNTER — Telehealth (INDEPENDENT_AMBULATORY_CARE_PROVIDER_SITE_OTHER): Payer: No Typology Code available for payment source | Admitting: Family Medicine

## 2019-08-16 ENCOUNTER — Encounter: Payer: Self-pay | Admitting: Family Medicine

## 2019-08-16 ENCOUNTER — Other Ambulatory Visit: Payer: Self-pay | Admitting: Family Medicine

## 2019-08-16 VITALS — Ht 60.0 in | Wt 145.0 lb

## 2019-08-16 DIAGNOSIS — F419 Anxiety disorder, unspecified: Secondary | ICD-10-CM | POA: Diagnosis not present

## 2019-08-16 MED ORDER — BUSPIRONE HCL 15 MG PO TABS: 15.0000 mg | ORAL_TABLET | Freq: Two times a day (BID) | ORAL | 1 refills | Status: DC

## 2019-08-16 NOTE — Telephone Encounter (Signed)
Appt scheduled for 08/26/19

## 2019-08-16 NOTE — Progress Notes (Signed)
Ht 5' (1.524 m)   Wt 145 lb (65.8 kg)   BMI 28.32 kg/m    Subjective:    Patient ID: Jessica Cross, female    DOB: Nov 26, 1990, 29 y.o.   MRN: [id number]  HPI: Jessica Cross is a 29 y.o. female  Chief Complaint  Patient presents with  . Anxiety    buspirone refill    . This visit was completed via MyChart due to the restrictions of the COVID-19 pandemic. All issues as above were discussed and addressed. Physical exam was done as above through visual confirmation on MyChart. If it was felt that the patient should be evaluated in the office, they were directed there. The patient verbally consented to this visit. . Location of the patient: home . Location of the provider: home . Those involved with this call:  . Provider: Merrie Roof, PA-C . CMA: Lesle Chris, [place] . Front Desk/Registration: Jill Side  . Time spent on call: 15 minutes with patient face to face via video conference. More than 50% of this time was spent in counseling and coordination of care. 5 minutes total spent in review of patient's record and preparation of their chart. I verified patient identity using two factors (patient name and date of birth). Patient consents verbally to being seen via telemedicine visit today.   Anxiety f/u - feels the buspar does help, but has been extra stressed the past mon th or so which is causing some breakthrough sxs. Currently taking it BID. States it makes her a bit groggy the first 30 min or so after taking it but that seems wear off quickly and she does not find it too bothersome. Denies side effects otherwise, SI/HI, mood concerns.   Depression screen [facility] 2/9 08/16/2019 04/08/2018 10/17/2015  Decreased Interest 2 0 0  Down, Depressed, Hopeless 2 0 0  PHQ - 2 Score 4 0 0  Altered sleeping 1 1 -  Tired, decreased energy 2 1 -  Change in appetite 0 0 -  Feeling bad or failure about yourself  1 0 -  Trouble concentrating 1 0 -  Moving slowly or fidgety/restless 0 0 -    Suicidal thoughts 0 0 -  PHQ-9 Score 9 2 -  Difficult doing work/chores Somewhat difficult - -   GAD 7 : Generalized Anxiety Score 08/16/2019 04/08/2018 03/26/2017  Nervous, Anxious, on Edge 2 1 3   Control/stop worrying 3 2 3   Worry too much - different things 3 2 3   Trouble relaxing 1 0 2  Restless 1 0 2  Easily annoyed or irritable 2 1 3   Afraid - awful might happen 2 2 3   Total GAD 7 Score 14 8 19   Anxiety Difficulty Somewhat difficult - Very difficult   Relevant past medical, surgical, family and social history reviewed and updated as indicated. Interim medical history since our last visit reviewed. Allergies and medications reviewed and updated.  Review of Systems  Per HPI unless specifically indicated above     Objective:    Ht 5' (1.524 m)   Wt 145 lb (65.8 kg)   BMI 28.32 kg/m   Wt Readings from Last 3 Encounters:  08/16/19 145 lb (65.8 kg)  07/08/19 150 lb (68 kg)  01/26/19 140 lb (63.5 kg)    Physical Exam Vitals and nursing note reviewed.  Constitutional:      General: She is not in acute distress.    Appearance: Normal appearance.  HENT:     Head: Atraumatic.  Right Ear: External ear normal.     Left Ear: External ear normal.     Nose: Nose normal. No congestion.     Mouth/Throat:     Mouth: Mucous membranes are moist.     Pharynx: Oropharynx is clear. No posterior oropharyngeal erythema.  Eyes:     Extraocular Movements: Extraocular movements intact.     Conjunctiva/sclera: Conjunctivae normal.  Cardiovascular:     Comments: Unable to assess via virtual visit Pulmonary:     Effort: Pulmonary effort is normal. No respiratory distress.  Musculoskeletal:        General: Normal range of motion.     Cervical back: Normal range of motion.  Skin:    General: Skin is dry.     Findings: No erythema.  Neurological:     Mental Status: She is alert and oriented to person, place, and time.  Psychiatric:        Mood and Affect: Mood normal.         Thought Content: Thought content normal.        Judgment: Judgment normal.     Results for orders placed or performed in visit on 07/08/19  Comprehensive metabolic panel  Result Value Ref Range   Glucose 106 (H) 65 - 99 mg/dL   BUN 9 6 - 20 mg/dL   Creatinine, Ser 0.90 0.57 - 1.00 mg/dL   GFR calc non Af Amer 86 >59 mL/min/1.73   GFR calc Af Amer 99 >59 mL/min/1.73   BUN/Creatinine Ratio 10 9 - 23   Sodium 140 134 - 144 mmol/L   Potassium 4.2 3.5 - 5.2 mmol/L   Chloride 104 96 - 106 mmol/L   CO2 22 20 - 29 mmol/L   Calcium 9.8 8.7 - 10.2 mg/dL   Total Protein 7.0 6.0 - 8.5 g/dL   Albumin 4.7 3.9 - 5.0 g/dL   Globulin, Total 2.3 1.5 - 4.5 g/dL   Albumin/Globulin Ratio 2.0 1.2 - 2.2   Bilirubin Total 0.4 0.0 - 1.2 mg/dL   Alkaline Phosphatase 57 39 - 117 IU/L   AST 16 0 - 40 IU/L   ALT 10 0 - 32 IU/L  CBC with Differential/Platelet out  Result Value Ref Range   WBC 5.7 3.4 - 10.8 x10E3/uL   RBC 4.63 3.77 - 5.28 x10E6/uL   Hemoglobin 14.4 11.1 - 15.9 g/dL   Hematocrit 41.8 34.0 - 46.6 %   MCV 90 79 - 97 fL   MCH 31.1 26.6 - 33.0 pg   MCHC 34.4 31.5 - 35.7 g/dL   RDW 12.0 11.7 - 15.4 %   Platelets 201 150 - 450 x10E3/uL   Neutrophils 61 Not Estab. %   Lymphs 31 Not Estab. %   Monocytes 6 Not Estab. %   Eos 1 Not Estab. %   Basos 1 Not Estab. %   Neutrophils Absolute 3.5 1.4 - 7.0 x10E3/uL   Lymphocytes Absolute 1.7 0.7 - 3.1 x10E3/uL   Monocytes Absolute 0.4 0.1 - 0.9 x10E3/uL   EOS (ABSOLUTE) 0.1 0.0 - 0.4 x10E3/uL   Basophils Absolute 0.0 0.0 - 0.2 x10E3/uL   Immature Granulocytes 0 Not Estab. %   Immature Grans (Abs) 0.0 0.0 - 0.1 x10E3/uL  Lipase  Result Value Ref Range   Lipase 33 14 - 72 U/L      Assessment & Plan:   Problem List Items Addressed This Visit      Other   Anxiety - Primary    Stable and well  controlled, continue current regimen      Relevant Medications   busPIRone (BUSPAR) 15 MG tablet       Follow up plan: Return in about 6  months (around 02/13/2020).

## 2019-08-16 NOTE — Telephone Encounter (Signed)
Requested medication (s) are due for refill today:   Yes  Requested medication (s) are on the active medication list:   Yes  Future visit scheduled:   No   Last ordered: 07/13/2019  #60  0 refills.  Clinic note:  There is a note that she is to have an appt before further refills.  30 day courtesy supply given in Jan.   Requested Prescriptions  Pending Prescriptions Disp Refills   busPIRone (BUSPAR) 15 MG tablet [Pharmacy Med Name: BUSPIRONE HCL 15 MG TABLET] 60 tablet 0    Sig: TAKE 2 TABLETS (30 MG TOTAL) BY MOUTH AT BEDTIME. * NEEDS APPT FOR FURTHER FILLS*      Psychiatry: Anxiolytics/Hypnotics - Non-controlled Passed - 08/16/2019  6:47 AM      Passed - Valid encounter within last 6 months    Recent Outpatient Visits           1 month ago Epigastric pain   [place], [place], [place]   6 months ago Sore throat   [facility] Merrie Roof [place], [place]   11 months ago Pharyngitis, unspecified etiology   [facility] Volney American, [place]   1 year ago [place], Rachel [place], [place]   2 years ago [place], [place], [place]

## 2019-08-19 ENCOUNTER — Telehealth: Payer: Self-pay | Admitting: Family Medicine

## 2019-08-21 NOTE — Assessment & Plan Note (Signed)
Stable and well controlled, continue current regimen 

## 2019-08-23 ENCOUNTER — Telehealth: Payer: Self-pay | Admitting: Family Medicine

## 2019-08-23 NOTE — Telephone Encounter (Signed)
Returned [employer]. She states that she messaged Apolonio Schneiders regarding the order and needed nothing else from [place].

## 2019-08-23 NOTE — Telephone Encounter (Signed)
Colletta Maryland, from ultra sound dept at [facility], called and is requesting to speak with someone regarding orders placed for pt. Please advise.    Callback # [id number]

## 2019-08-24 ENCOUNTER — Ambulatory Visit
Admission: RE | Admit: 2019-08-24 | Discharge: 2019-08-24 | Disposition: A | Discharge status: Home / Self Care | Payer: Commercial Managed Care - PPO | Source: Ambulatory Visit | Attending: Family Medicine | Admitting: Family Medicine

## 2019-08-24 ENCOUNTER — Other Ambulatory Visit: Payer: Self-pay

## 2019-08-24 DIAGNOSIS — R1010 Upper abdominal pain, unspecified: Secondary | ICD-10-CM | POA: Diagnosis present

## 2019-08-26 ENCOUNTER — Telehealth: Payer: Self-pay | Admitting: Family Medicine

## 2019-08-26 ENCOUNTER — Ambulatory Visit: Payer: Self-pay | Admitting: Family Medicine

## 2019-09-15 ENCOUNTER — Other Ambulatory Visit: Payer: Self-pay | Admitting: Family Medicine

## 2019-10-16 ENCOUNTER — Other Ambulatory Visit: Payer: Self-pay | Admitting: Family Medicine

## 2019-10-16 NOTE — Telephone Encounter (Signed)
Requested Prescriptions  Pending Prescriptions Disp Refills  . pantoprazole (PROTONIX) 40 MG tablet [Pharmacy Med Name: PANTOPRAZOLE SOD DR 40 MG TAB] 90 tablet 1    Sig: TAKE 1 TABLET BY MOUTH EVERY DAY     Gastroenterology: Proton Pump Inhibitors Passed - 10/16/2019  1:31 PM      Passed - Valid encounter within last 12 months    Recent Outpatient Visits          2 months ago [place], [place]   3 months ago Epigastric pain   [place], [place], [place]   8 months ago Sore throat   [facility] Merrie Roof [place], [place]   1 year ago Pharyngitis, unspecified etiology   [facility] Volney American, [place]   1 year ago [place], [place], [place]

## 2019-11-11 ENCOUNTER — Ambulatory Visit (INDEPENDENT_AMBULATORY_CARE_PROVIDER_SITE_OTHER): Payer: Commercial Managed Care - PPO | Admitting: Family Medicine

## 2019-11-11 ENCOUNTER — Encounter: Payer: Self-pay | Admitting: Family Medicine

## 2019-11-11 VITALS — Temp 97.2°F | Wt 150.0 lb

## 2019-11-11 DIAGNOSIS — J069 Acute upper respiratory infection, unspecified: Secondary | ICD-10-CM

## 2019-11-11 MED ORDER — PREDNISONE 10 MG PO TABS
ORAL_TABLET | ORAL | 0 refills | Status: DC
Start: 2019-11-11 — End: 2019-11-25

## 2019-11-11 NOTE — Addendum Note (Signed)
Addended by: Merrie Roof E on: 11/11/2019 02:20 PM   Modules accepted: Orders

## 2019-11-11 NOTE — Progress Notes (Signed)
Temp (!) 97.2 F (36.2 C) (Oral)   Wt 150 lb (68 kg)   BMI 29.29 kg/m    Subjective:    Patient ID: Jessica Cross, female    DOB: 1991-02-01, 29 y.o.   MRN: [id number]  HPI: Jessica Cross is a 29 y.o. female  Chief Complaint  Patient presents with  . Cough    symptoms started last Wednesday  . Sore Throat  . Headache  . Nasal Congestion    . This visit was completed via MyChart due to the restrictions of the COVID-19 pandemic. All issues as above were discussed and addressed. Physical exam was done as above through visual confirmation on MyChart. If it was felt that the patient should be evaluated in the office, they were directed there. The patient verbally consented to this visit. . Location of the patient: home . Location of the provider: work . Those involved with this call:  . Provider: Merrie Roof, PA-C . CMA: Lesle Chris, [place] . Front Desk/Registration: Jill Side  . Time spent on call: 15 minutes with patient face to face via video conference. More than 50% of this time was spent in counseling and coordination of care. 5 minutes total spent in review of patient's record and preparation of their chart. I verified patient identity using two factors (patient name and date of birth). Patient consents verbally to being seen via telemedicine visit today.   Started over a week ago with sore throat, headache, nasal congestion, productive cough. Taking zyrtec and ibuprofen with mild temporary relief and started a zpak she had at home which she finished yesterday with minimal relief. Notes also having intermittent hives flares since onset of illness which she doesn't usually have. Denies fever, chills, CP, SOB, N/V/D, and had a negative COVID test earlier this week.   Relevant past medical, surgical, family and social history reviewed and updated as indicated. Interim medical history since our last visit reviewed. Allergies and medications reviewed and updated.  Review of  Systems  Per HPI unless specifically indicated above     Objective:    Temp (!) 97.2 F (36.2 C) (Oral)   Wt 150 lb (68 kg)   BMI 29.29 kg/m   Wt Readings from Last 3 Encounters:  11/11/19 150 lb (68 kg)  08/16/19 145 lb (65.8 kg)  07/08/19 150 lb (68 kg)    Physical Exam Vitals and nursing note reviewed.  Constitutional:      General: She is not in acute distress.    Appearance: Normal appearance.  HENT:     Head: Atraumatic.     Right Ear: External ear normal.     Left Ear: External ear normal.     Nose: Congestion present.     Mouth/Throat:     Mouth: Mucous membranes are moist.     Pharynx: Oropharynx is clear. Posterior oropharyngeal erythema present.  Eyes:     Extraocular Movements: Extraocular movements intact.     Conjunctiva/sclera: Conjunctivae normal.  Cardiovascular:     Comments: Unable to assess via virtual visit Pulmonary:     Effort: Pulmonary effort is normal. No respiratory distress.  Musculoskeletal:        General: Normal range of motion.     Cervical back: Normal range of motion.  Skin:    General: Skin is dry.     Findings: No erythema.  Neurological:     Mental Status: She is alert and oriented to person, place, and time.  Psychiatric:  Mood and Affect: Mood normal.        Thought Content: Thought content normal.        Judgment: Judgment normal.     Results for orders placed or performed in visit on 07/08/19  Comprehensive metabolic panel  Result Value Ref Range   Glucose 106 (H) 65 - 99 mg/dL   BUN 9 6 - 20 mg/dL   Creatinine, Ser 0.90 0.57 - 1.00 mg/dL   GFR calc non Af Amer 86 >59 mL/min/1.73   GFR calc Af Amer 99 >59 mL/min/1.73   BUN/Creatinine Ratio 10 9 - 23   Sodium 140 134 - 144 mmol/L   Potassium 4.2 3.5 - 5.2 mmol/L   Chloride 104 96 - 106 mmol/L   CO2 22 20 - 29 mmol/L   Calcium 9.8 8.7 - 10.2 mg/dL   Total Protein 7.0 6.0 - 8.5 g/dL   Albumin 4.7 3.9 - 5.0 g/dL   Globulin, Total 2.3 1.5 - 4.5 g/dL    Albumin/Globulin Ratio 2.0 1.2 - 2.2   Bilirubin Total 0.4 0.0 - 1.2 mg/dL   Alkaline Phosphatase 57 39 - 117 IU/L   AST 16 0 - 40 IU/L   ALT 10 0 - 32 IU/L  CBC with Differential/Platelet out  Result Value Ref Range   WBC 5.7 3.4 - 10.8 x10E3/uL   RBC 4.63 3.77 - 5.28 x10E6/uL   Hemoglobin 14.4 11.1 - 15.9 g/dL   Hematocrit 41.8 34.0 - 46.6 %   MCV 90 79 - 97 fL   MCH 31.1 26.6 - 33.0 pg   MCHC 34.4 31.5 - 35.7 g/dL   RDW 12.0 11.7 - 15.4 %   Platelets 201 150 - 450 x10E3/uL   Neutrophils 61 Not Estab. %   Lymphs 31 Not Estab. %   Monocytes 6 Not Estab. %   Eos 1 Not Estab. %   Basos 1 Not Estab. %   Neutrophils Absolute 3.5 1.4 - 7.0 x10E3/uL   Lymphocytes Absolute 1.7 0.7 - 3.1 x10E3/uL   Monocytes Absolute 0.4 0.1 - 0.9 x10E3/uL   EOS (ABSOLUTE) 0.1 0.0 - 0.4 x10E3/uL   Basophils Absolute 0.0 0.0 - 0.2 x10E3/uL   Immature Granulocytes 0 Not Estab. %   Immature Grans (Abs) 0.0 0.0 - 0.1 x10E3/uL  Lipase  Result Value Ref Range   Lipase 33 14 - 72 U/L      Assessment & Plan:   Problem List Items Addressed This Visit    None    Visit Diagnoses    Upper respiratory tract infection, unspecified type    -  Primary   Already completed zpak, tx with prednisone taper, mucinex, continued antihistamines which should also both help the hives. F/u if not resolving       Follow up plan: Return if symptoms worsen or fail to improve.

## 2019-11-24 ENCOUNTER — Encounter: Payer: Self-pay | Admitting: Family Medicine

## 2019-11-25 ENCOUNTER — Other Ambulatory Visit: Payer: Self-pay | Admitting: Family Medicine

## 2019-11-25 MED ORDER — PREDNISONE 10 MG PO TABS: ORAL_TABLET | ORAL | 0 refills | Status: DC

## 2020-02-11 ENCOUNTER — Other Ambulatory Visit: Payer: Self-pay | Admitting: Family Medicine

## 2020-02-11 NOTE — Telephone Encounter (Signed)
Requested Prescriptions  Pending Prescriptions Disp Refills  . busPIRone (BUSPAR) 15 MG tablet [Pharmacy Med Name: BUSPIRONE HCL 15 MG TABLET] 180 tablet 0    Sig: TAKE 1 TABLET (15 MG TOTAL) BY MOUTH 2 (TWO) TIMES DAILY.     Psychiatry: Anxiolytics/Hypnotics - Non-controlled Passed - 02/11/2020  9:18 AM      Passed - Valid encounter within last 6 months    Recent Outpatient Visits          3 months ago Upper respiratory tract infection, unspecified type   [facility] Volney American, [place]   5 months ago [place], [place], [place]   7 months ago Epigastric pain   [facility] Merrie Roof [place], [place]   1 year ago Sore throat   [facility] Merrie Roof [place], [place]   1 year ago Pharyngitis, unspecified etiology   [place], [place], [place]

## 2020-03-06 ENCOUNTER — Other Ambulatory Visit: Payer: Self-pay | Admitting: Family Medicine

## 2020-03-06 NOTE — Telephone Encounter (Signed)
Requested medication (s) are due for refill today: no  Requested medication (s) are on the active medication list: yes  Last refill:  02/11/20 #180  0 refills  Future visit scheduled: No called left VM for her to schedule appointment  Notes to clinic:  Pharmacy requesting 90 day Rx Patient needs OV. Due every 6 months for this medication. She was given 30 day 02/11/20    Requested Prescriptions  Pending Prescriptions Disp Refills   busPIRone (BUSPAR) 15 MG tablet [Pharmacy Med Name: BUSPIRONE HCL 15 MG TABLET] 180 tablet 1    Sig: TAKE 1 TABLET (15 MG TOTAL) BY MOUTH 2 (TWO) TIMES DAILY.      Psychiatry: Anxiolytics/Hypnotics - Non-controlled Passed - 03/06/2020  2:32 PM      Passed - Valid encounter within last 6 months    Recent Outpatient Visits           3 months ago Upper respiratory tract infection, unspecified type   [facility] Volney American, [place]   6 months ago [place], [place], [place]   8 months ago Epigastric pain   [facility] Merrie Roof [place], [place]   1 year ago Sore throat   [facility] Merrie Roof [place], [place]   1 year ago Pharyngitis, unspecified etiology   [place], [place], [place]

## 2020-03-06 NOTE — Telephone Encounter (Signed)
See message below °

## 2020-04-13 ENCOUNTER — Other Ambulatory Visit: Payer: Self-pay | Admitting: Obstetrics & Gynecology

## 2020-04-13 NOTE — Progress Notes (Signed)
1 Chief Complaint:    Patient ID: Jessica Cross is a 29 y.o. female presenting with Pre-op Exam  on 04/13/2020  HPI: She is an established patient with a long history of dysmenorrhea. She has tried multiple forms of hormonal regulation: OCPs, Nexplanon and patch. She is tired of pain associated with periods and has requested a hysterectomy.  Declines other interventions including ablation and UAE/UFEmbolization.  Returns today for preoperative exam and endometrial biopsy.   Workup:  Pap: 2019 neg/neg, never abnormal  EMBx: pending   TVUS: 08/2019 Uterus anteverted 8.5 x 4 x 5 cm Fibroids seen -Left lateral posterior = 4.5 cm -Posterior = 1.0 cm  Endometrium 7 mm LO 3 x 1.5 x 2 cm with simple ovarian cyst = 1 cm RO 3.5 x 2.5 x 3 cm with simple ovarian cyst = 3 cm  No free fluid seen   Past Medical History:  has a past medical history of Anemia, Depression, Heart palpitations (05/26/2018), and Multiple allergies.  Past Surgical History:  has a past surgical history that includes other surgery (06/20003). Family History: family history includes Breast cancer in her maternal grandmother and another family member; Lymphoma in her cousin. Social History:  reports that she has been smoking cigarettes. She has been smoking about 1.00 pack per day. She has never used smokeless tobacco. She reports current alcohol use. She reports that she does not use drugs. OB/GYN History:  OB History    Gravida  1   Para  1   Term  1   Preterm      AB      Living  1     SAB      IAB      Ectopic      Molar      Multiple      Live Births             Allergies: is allergic to sulfamethoxazole-trimethoprim. Medications:  Current Outpatient Medications:  .  busPIRone (BUSPAR) 15 MG tablet, Take by mouth 2 (two) times daily, Disp: , Rfl:  .  metoprolol succinate (TOPROL-XL) 25 MG XL tablet, TAKE 1 TABLET BY MOUTH EVERY DAY, Disp: 90 tablet, Rfl: 3 .  norelgestromin-ethinyl  estradiol (ORTHO EVRA) 150-35 mcg/24 hr patch, Place 1 patch onto the skin once a week (Patient not taking: Reported on 07/27/2019  ), Disp: 9 patch, Rfl: 3   Review of Systems: No SOB, no palpitations or chest pain, no new lower extremity edema, no nausea or vomiting or bowel or bladder complaints. See HPI for gyn specific ROS.   Exam:      Constitutional: BP 113/79   Pulse 66   Ht 152.4 cm (5')   Wt 71.3 kg (157 lb 3.2 oz)   LMP 03/21/2020 (Within Days)   BMI 30.70 kg/m   WDWN female in NAD   HEENT: sclera clear, non-icteric, moist mucous membranes, dentition intact Endocrine:  no thyromegaly Respiratory: normal respiratory effort, CTABL    CV: no peripheral edema, RRR no MRG GI: soft , no mass, non-tender, no rebound tenderness  Genitalia:     Pelvic exam:         External: Tanner stage 5, normal female genitalia for age without lesions or masses, no inguinal lymphadenopathy, normal vulva and perineal sensation, anal wink and bulbocavernosus reflexes present.        Bladder: Normal size without masses or tenderness, well-supported        Urethra: No lesions or discharge with  palpation. Normal urethral size and location, no prolapse, no meatal caruncle, no diverticulum        Vagina: normal physiological discharge, without lesions or masses, no abnormal support structures        Cervix: no lesions or masses, no CMT, no discharge, high in vagina        Adnexa: non-tender, without masses or fullness bilaterally        Uterus: Normal size- fuller anteriorly, anteverted position without masses or tenderness, mobile, non-tender, smooth contour         Anus/Perineum: Normal external exam, no engorged hemorrhoids  Skin: warm and well perfused, no rashes Neuro: alert, oriented x3,   Psych: appropriate mood and insight, judgement intact   Impression:   The primary encounter diagnosis was Dysmenorrhea. Diagnoses of Encounter for pre-operative examination, Preoperative evaluation to  rule out surgical contraindication, and Pelvic pain in female were also pertinent to this visit.    Plan:   Planned procedure: Total Laparoscopic Hysterectomy with Bilateral Salpingectomy   The patient and I discussed the technical aspects of the procedure including the potential for risks and complications.These include but are not limited to the risk of infection requiring post-operative antibiotics or further procedures.We talked about the risk of injury to adjacent organs including bladder, bowel, ureter, blood vessels or nerves, the need to convert to an open incision, possibleneed for blood transfusion andpostop complications such asthromboembolic or cardiopulmonary complications.All of her questions were answered. Her preoperative exam was completed.  She will obtain cardiac clearance. She is scheduled to undergo this procedure in the near future.    I personally performed the service. (TP)  Deronda Christian CRIST Buddie Marston, MD

## 2020-04-24 ENCOUNTER — Other Ambulatory Visit: Payer: Self-pay | Admitting: Obstetrics & Gynecology

## 2020-04-30 ENCOUNTER — Other Ambulatory Visit: Payer: Self-pay | Admitting: Family Medicine

## 2020-04-30 NOTE — Telephone Encounter (Signed)
Requested medication (s) are due for refill today: Yes  Requested medication (s) are on the active medication list: Yes  Last refill:  02/11/20  Future visit scheduled: No  Notes to clinic:  Jessica Cross pt.    Requested Prescriptions  Pending Prescriptions Disp Refills   busPIRone (BUSPAR) 15 MG tablet [Pharmacy Med Name: BUSPIRONE HCL 15 MG TABLET] 60 tablet 2    Sig: TAKE 1 TABLET (15 MG TOTAL) BY MOUTH 2 (TWO) TIMES DAILY.      Psychiatry: Anxiolytics/Hypnotics - Non-controlled Passed - 04/30/2020  1:36 AM      Passed - Valid encounter within last 6 months    Recent Outpatient Visits           5 months ago Upper respiratory tract infection, unspecified type   [facility] [place], [place]   8 months ago [place], [place], [place]   9 months ago Epigastric pain   [place], [place], [place]   1 year ago Sore throat   [facility] Jessica Cross [place], [place]   1 year ago Pharyngitis, unspecified etiology   [place], [place], [place]

## 2020-05-01 ENCOUNTER — Other Ambulatory Visit: Payer: Self-pay | Admitting: Obstetrics & Gynecology

## 2020-05-04 ENCOUNTER — Other Ambulatory Visit: Payer: Self-pay

## 2020-05-04 ENCOUNTER — Other Ambulatory Visit
Admission: RE | Admit: 2020-05-04 | Discharge: 2020-05-04 | Disposition: A | Discharge status: Home / Self Care | Payer: Commercial Managed Care - PPO | Source: Ambulatory Visit | Attending: Obstetrics & Gynecology | Admitting: Obstetrics & Gynecology

## 2020-05-04 HISTORY — DX: Anemia, unspecified: D64.9

## 2020-05-04 NOTE — Patient Instructions (Signed)
Your procedure is scheduled on: Monday May 14, 2020. Report to Day Surgery inside [place] 2nd floor. To find out your arrival time please call [phone] between 1PM - 3PM on Friday May 11, 2020.  Remember: Instructions that are not followed completely may result in serious medical risk,  up to and including death, or upon the discretion of your surgeon and anesthesiologist your  surgery may need to be rescheduled.     _X__ 1. Do not eat food after midnight the night before your procedure.                 No chewing gum or hard candies. You may drink clear liquids up to 2 hours                 before you are scheduled to arrive for your surgery- DO not drink clear                 liquids within 2 hours of the start of your surgery.                 Clear Liquids include:  water, apple juice without pulp, clear Gatorade, G2 or                  Gatorade Zero (avoid Red/Purple/Blue), Black Coffee or Tea (Do not add                 anything to coffee or tea).  __X__2.   Complete the "Ensure Clear Pre-surgery Clear Carbohydrate Drink" provided to you, 2 hours before arrival. **If you are diabetic you will be provided with an alternative drink, Gatorade Zero or G2.  __X__3.  On the morning of surgery brush your teeth with toothpaste and water, you                may rinse your mouth with mouthwash if you wish.  Do not swallow any toothpaste of mouthwash.     _X__ 4.  No Alcohol for 24 hours before or after surgery.   _X__ 5.  Do Not Smoke or use e-cigarettes For 24 Hours Prior to Your Surgery.                 Do not use any chewable tobacco products for at least 6 hours prior to                 Surgery.  _X__  6.  Do not use any recreational drugs (marijuana, cocaine, heroin, ecstasy, MDMA or other)                For at least one week prior to your surgery.  Combination of these drugs with anesthesia                May have life threatening results.    __X__  7.  Notify your doctor if there is any change in your medical condition      (cold, fever, infections).     Do not wear jewelry, make-up, hairpins, clips or nail polish. Do not wear lotions, powders, or perfumes. You may wear deodorant. Do not shave 48 hours prior to surgery. Men may shave face and neck. Do not bring valuables to the hospital.    [facility] is not responsible for any belongings or valuables.  Contacts, dentures or bridgework may not be worn into surgery. Leave your suitcase in the car. After surgery it may be brought to your room.  For patients admitted to the hospital, discharge time is determined by your treatment team.   Patients discharged the day of surgery will not be allowed to drive home.   Make arrangements for someone to be with you for the first 24 hours of your Same Day Discharge.   __X__ Take these medicines the morning of surgery with A SIP OF WATER:    1. busPIRone (BUSPAR) 15 MG  2. metoprolol succinate (TOPROL-XL) 25 MG 24  3. pantoprazole (PROTONIX) 40 MG  ____ Fleet Enema (as directed)   __X__ Use CHG Soap (or wipes) as directed  ____ Use Benzoyl Peroxide Gel as instructed  ____ Use inhalers on the day of surgery  ____ Stop metformin 2 days prior to surgery    ____ Take 1/2 of usual insulin dose the night before surgery. No insulin the morning          of surgery.   ____ Stop Coumadin/Plavix/aspirin   __X__ Stop Anti-inflammatories such as Ibuprofen, Aleve, Advil, naproxen, aspirin and or BC powders.    __X__ Stop supplements until after surgery.    __X__ Do no start any herbal supplements before your procedure.    If you have any questions regarding your pre-procedure instructions,  Please call Pre-admit Testing at [phone].

## 2020-05-07 NOTE — H&P (Signed)
HPI: She is an established patient with a long history of dysmenorrhea. She has tried multiple forms of hormonal regulation: OCPs, Nexplanon and patch. She is tired of pain associated with periods and has requested a hysterectomy.  Declines other interventions including ablation and UAE/UFEmbolization.  Workup:  Pap: 2019 neg/neg, never abnormal  EMBx: 04/13/2020: NO HYPERPLASIA OR CARCINOMA.  TVUS: 08/2019 Uterus anteverted 8.5 x 4 x 5 cm Fibroids seen -Left lateral posterior = 4.5 cm -Posterior = 1.0 cm  Endometrium 7 mm LO 3 x 1.5 x 2 cm with simple ovarian cyst = 1 cm RO 3.5 x 2.5 x 3 cm with simple ovarian cyst = 3 cm No free fluid seen  She has been cleared from a cardiac perspective to proceed with surgery.   Past Medical History:  has a past medical history of Anemia, Depression, Heart palpitations (05/26/2018), and Multiple allergies.  Past Surgical History:  has a past surgical history that includes other surgery (06/20003). Family History: family history includes Breast cancer in her maternal grandmother and another family member; Lymphoma in her cousin. Social History:  reports that she has been smoking cigarettes. She has been smoking about 1.00 pack per day. She has never used smokeless tobacco. She reports current alcohol use. She reports that she does not use drugs. OB/GYN History:          OB History    Gravida  1   Para  1   Term  1   Preterm      AB      Living  1     SAB      IAB      Ectopic      Molar      Multiple      Live Births             Allergies: is allergic to sulfamethoxazole-trimethoprim. Medications:  Current Outpatient Medications:  .  busPIRone (BUSPAR) 15 MG tablet, Take by mouth 2 (two) times daily, Disp: , Rfl:  .  metoprolol succinate (TOPROL-XL) 25 MG XL tablet, TAKE 1 TABLET BY MOUTH EVERY DAY, Disp: 90 tablet, Rfl: 3 .  norelgestromin-ethinyl estradiol (ORTHO EVRA) 150-35 mcg/24 hr patch, Place 1  patch onto the skin once a week (Patient not taking: Reported on 07/27/2019  ), Disp: 9 patch, Rfl: 3   Review of Systems: No SOB, no palpitations or chest pain, no new lower extremity edema, no nausea or vomiting or bowel or bladder complaints. See HPI for gyn specific ROS.   Exam:    Constitutional: BP 113/79   Pulse 66   Ht 152.4 cm (5')   Wt 71.3 kg (157 lb 3.2 oz)   LMP 03/21/2020 (Within Days)   BMI 30.70 kg/m   WDWN female in NAD   HEENT: sclera clear, non-icteric, moist mucous membranes, dentition intact Endocrine:  no thyromegaly Respiratory: normal respiratory effort, CTABL    CV: no peripheral edema, RRR no MRG GI: soft , no mass, non-tender, no rebound tenderness  Genitalia: Pelvic exam:  External: Tanner stage 5, normal female genitalia for age without lesions or masses, no inguinal lymphadenopathy, normal vulva and perineal sensation, anal wink and bulbocavernosus reflexes present. Bladder: Normal size without masses or tenderness, well-supported Urethra: No lesions or discharge with palpation. Normal urethral size and location, no prolapse, no meatal caruncle, no diverticulum Vagina: normal physiological discharge, without lesions or masses, no abnormal support structures Cervix: no lesions or masses, no CMT, no discharge, high in vagina  Adnexa: non-tender, without masses or fullness bilaterally Uterus: Normal size- fuller anteriorly, anteverted position without masses or tenderness, mobile, non-tender, smooth contour  Anus/Perineum: Normal external exam, no engorged hemorrhoids  Skin: warm and well perfused, no rashes Neuro: alert, oriented x3,   Psych: appropriate mood and insight, judgement intact     Impression:   The primary encounter diagnosis was Dysmenorrhea. Diagnoses of Encounter for pre-operative examination, Preoperative evaluation to rule out surgical contraindication, and  Pelvic pain in female were also pertinent to this visit.    Plan:   Planned procedure: Total Laparoscopic Hysterectomy with Bilateral Salpingectomy   The patient and I discussed the technical aspects of the procedure including the potential for risks and complications.These include but are not limited to the risk of infection requiring post-operative antibiotics or further procedures.We talked about the risk of injury to adjacent organs including bladder, bowel, ureter, blood vessels or nerves, the need to convert to an open incision, possibleneed for blood transfusion andpostop complications such asthromboembolic or cardiopulmonary complications.All of her questions were answered. Her preoperative exam was completed.  She has obtained cardiac clearance. She is scheduled to undergo this procedure in the near future.    I personally performed the service. (TP)  Kanye Depree CRIST Julienne Vogler, MD

## 2020-05-10 ENCOUNTER — Other Ambulatory Visit
Admission: RE | Admit: 2020-05-10 | Discharge: 2020-05-10 | Disposition: A | Discharge status: Home / Self Care | Payer: Commercial Managed Care - PPO | Source: Ambulatory Visit | Attending: Obstetrics & Gynecology | Admitting: Obstetrics & Gynecology

## 2020-05-10 ENCOUNTER — Other Ambulatory Visit: Payer: Self-pay

## 2020-05-10 DIAGNOSIS — Z20822 Contact with and (suspected) exposure to covid-19: Secondary | ICD-10-CM | POA: Insufficient documentation

## 2020-05-10 DIAGNOSIS — Z01812 Encounter for preprocedural laboratory examination: Secondary | ICD-10-CM | POA: Diagnosis not present

## 2020-05-10 LAB — CBC
HCT: 42.2 % (ref 36.0–46.0)
Hemoglobin: 14.5 g/dL (ref 12.0–15.0)
MCH: 30.3 pg (ref 26.0–34.0)
MCHC: 34.4 g/dL (ref 30.0–36.0)
MCV: 88.3 fL (ref 80.0–100.0)
Platelets: 239 10*3/uL (ref 150–400)
RBC: 4.78 MIL/uL (ref 3.87–5.11)
RDW: 12.3 % (ref 11.5–15.5)
WBC: 4.5 10*3/uL (ref 4.0–10.5)
nRBC: 0 % (ref 0.0–0.2)

## 2020-05-10 LAB — BASIC METABOLIC PANEL
Anion gap: 10 (ref 5–15)
BUN: 13 mg/dL (ref 6–20)
CO2: 24 mmol/L (ref 22–32)
Calcium: 9.7 mg/dL (ref 8.9–10.3)
Chloride: 102 mmol/L (ref 98–111)
Creatinine, Ser: 0.69 mg/dL (ref 0.44–1.00)
GFR, Estimated: >60 mL/min (ref >60)
Glucose, Bld: 90 mg/dL (ref 70–99)
Potassium: 3.8 mmol/L (ref 3.5–5.1)
Sodium: 136 mmol/L (ref 135–145)

## 2020-05-10 LAB — TYPE AND SCREEN
ABO/RH(D): O POS
Antibody Screen: NEGATIVE

## 2020-05-11 LAB — SARS CORONAVIRUS 2 (TAT 6-24 HRS): SARS Coronavirus 2: NEGATIVE

## 2020-05-13 MED ORDER — SCOPOLAMINE 1 MG/3DAYS TD PT72: 1.0000 | MEDICATED_PATCH | TRANSDERMAL | Status: DC

## 2020-05-13 MED ORDER — HEPARIN SODIUM (PORCINE) 5000 UNIT/ML IJ SOLN: 5000.0000 [IU] | INTRAMUSCULAR | Status: AC

## 2020-05-13 MED ORDER — KETOROLAC TROMETHAMINE 15 MG/ML IJ SOLN: 15.0000 mg | INTRAMUSCULAR | Status: AC

## 2020-05-13 MED ORDER — CEFAZOLIN SODIUM-DEXTROSE 2-4 GM/100ML-% IV SOLN
2.0000 g | INTRAVENOUS | Status: AC
  Administered 2020-05-14: 2 g via INTRAVENOUS

## 2020-05-13 MED ORDER — ACETAMINOPHEN 500 MG PO TABS
1000.0000 mg | ORAL_TABLET | ORAL | Status: AC
  Administered 2020-05-14: 1000 mg via ORAL

## 2020-05-13 MED ORDER — ORAL CARE MOUTH RINSE: 15.0000 mL | Freq: Once | OROMUCOSAL | Status: AC

## 2020-05-13 MED ORDER — GABAPENTIN 300 MG PO CAPS: 600.0000 mg | ORAL_CAPSULE | ORAL | Status: AC

## 2020-05-13 MED ORDER — CHLORHEXIDINE GLUCONATE 0.12 % MT SOLN: 15.0000 mL | Freq: Once | OROMUCOSAL | Status: AC

## 2020-05-13 MED ORDER — DEXAMETHASONE SODIUM PHOSPHATE 10 MG/ML IJ SOLN: 4.0000 mg | INTRAMUSCULAR | Status: AC

## 2020-05-13 MED ORDER — LACTATED RINGERS IV SOLN: INTRAVENOUS | Status: DC

## 2020-05-14 ENCOUNTER — Ambulatory Visit
Admission: RE | Admit: 2020-05-14 | Discharge: 2020-05-14 | Disposition: A | Discharge status: Home / Self Care | Payer: Commercial Managed Care - PPO | Attending: Obstetrics & Gynecology | Admitting: Obstetrics & Gynecology

## 2020-05-14 ENCOUNTER — Encounter: Payer: Self-pay | Admitting: Obstetrics & Gynecology

## 2020-05-14 ENCOUNTER — Other Ambulatory Visit: Payer: Self-pay

## 2020-05-14 ENCOUNTER — Ambulatory Visit: Payer: Commercial Managed Care - PPO | Admitting: Anesthesiology

## 2020-05-14 ENCOUNTER — Encounter
Admission: RE | Disposition: A | Discharge status: Home / Self Care | Payer: Self-pay | Source: Home / Self Care | Attending: Obstetrics & Gynecology

## 2020-05-14 DIAGNOSIS — R102 Pelvic and perineal pain: Secondary | ICD-10-CM | POA: Diagnosis present

## 2020-05-14 DIAGNOSIS — Z881 Allergy status to other antibiotic agents status: Secondary | ICD-10-CM | POA: Insufficient documentation

## 2020-05-14 DIAGNOSIS — N946 Dysmenorrhea, unspecified: Secondary | ICD-10-CM | POA: Diagnosis not present

## 2020-05-14 DIAGNOSIS — N72 Inflammatory disease of cervix uteri: Secondary | ICD-10-CM | POA: Insufficient documentation

## 2020-05-14 DIAGNOSIS — D251 Intramural leiomyoma of uterus: Secondary | ICD-10-CM | POA: Diagnosis not present

## 2020-05-14 DIAGNOSIS — F17210 Nicotine dependence, cigarettes, uncomplicated: Secondary | ICD-10-CM | POA: Diagnosis not present

## 2020-05-14 DIAGNOSIS — Z79899 Other long term (current) drug therapy: Secondary | ICD-10-CM | POA: Insufficient documentation

## 2020-05-14 HISTORY — PX: TOTAL LAPAROSCOPIC HYSTERECTOMY WITH SALPINGECTOMY: SHX6742

## 2020-05-14 HISTORY — PX: CYSTOSCOPY: SHX5120

## 2020-05-14 LAB — ABO/RH: ABO/RH(D): O POS

## 2020-05-14 SURGERY — HYSTERECTOMY, TOTAL, LAPAROSCOPIC, WITH SALPINGECTOMY
Anesthesia: General | Laterality: Bilateral

## 2020-05-14 MED ORDER — LIDOCAINE HCL (CARDIAC) PF 100 MG/5ML IV SOSY
PREFILLED_SYRINGE | INTRAVENOUS | Status: DC | PRN
  Administered 2020-05-14: 80 mg via INTRAVENOUS

## 2020-05-14 MED ORDER — PHENYLEPHRINE HCL (PRESSORS) 10 MG/ML IV SOLN
INTRAVENOUS | Status: DC | PRN
  Administered 2020-05-14 (×2): 50 ug via INTRAVENOUS

## 2020-05-14 MED ORDER — IBUPROFEN 800 MG PO TABS: 800.0000 mg | ORAL_TABLET | Freq: Four times a day (QID) | ORAL | 0 refills | Status: DC

## 2020-05-14 MED ORDER — CEFAZOLIN SODIUM-DEXTROSE 2-4 GM/100ML-% IV SOLN
INTRAVENOUS | Status: AC
  Filled 2020-05-14: qty 100

## 2020-05-14 MED ORDER — DEXAMETHASONE SODIUM PHOSPHATE 10 MG/ML IJ SOLN: INTRAMUSCULAR | Status: DC | PRN

## 2020-05-14 MED ORDER — FENTANYL CITRATE (PF) 100 MCG/2ML IJ SOLN
INTRAMUSCULAR | Status: DC | PRN
  Administered 2020-05-14 (×2): 50 ug via INTRAVENOUS

## 2020-05-14 MED ORDER — OXYCODONE HCL 5 MG/5ML PO SOLN: 5.0000 mg | Freq: Once | ORAL | Status: DC | PRN

## 2020-05-14 MED ORDER — ONDANSETRON HCL 4 MG/2ML IJ SOLN
INTRAMUSCULAR | Status: DC | PRN
  Administered 2020-05-14: 4 mg via INTRAVENOUS

## 2020-05-14 MED ORDER — ROCURONIUM BROMIDE 100 MG/10ML IV SOLN
INTRAVENOUS | Status: DC | PRN
  Administered 2020-05-14: 20 mg via INTRAVENOUS
  Administered 2020-05-14: 40 mg via INTRAVENOUS

## 2020-05-14 MED ORDER — KETOROLAC TROMETHAMINE 15 MG/ML IJ SOLN
INTRAMUSCULAR | Status: AC
  Administered 2020-05-14: 15 mg via INTRAVENOUS
  Filled 2020-05-14: qty 1

## 2020-05-14 MED ORDER — HEPARIN SODIUM (PORCINE) 5000 UNIT/ML IJ SOLN
INTRAMUSCULAR | Status: AC
  Administered 2020-05-14: 5000 [IU] via SUBCUTANEOUS
  Filled 2020-05-14: qty 1

## 2020-05-14 MED ORDER — MIDAZOLAM HCL 2 MG/2ML IJ SOLN
INTRAMUSCULAR | Status: AC
  Filled 2020-05-14: qty 2

## 2020-05-14 MED ORDER — OXYCODONE HCL 5 MG PO TABS
5.0000 mg | ORAL_TABLET | Freq: Three times a day (TID) | ORAL | 0 refills | Status: DC | PRN
Start: 2020-05-14 — End: 2020-08-17

## 2020-05-14 MED ORDER — MEPERIDINE HCL 50 MG/ML IJ SOLN: 6.2500 mg | INTRAMUSCULAR | Status: DC | PRN

## 2020-05-14 MED ORDER — OXYCODONE HCL 5 MG PO TABS: 5.0000 mg | ORAL_TABLET | Freq: Once | ORAL | Status: DC | PRN

## 2020-05-14 MED ORDER — DEXAMETHASONE SODIUM PHOSPHATE 10 MG/ML IJ SOLN
INTRAMUSCULAR | Status: AC
  Administered 2020-05-14: 4 mg via INTRAVENOUS
  Filled 2020-05-14: qty 1

## 2020-05-14 MED ORDER — FENTANYL CITRATE (PF) 100 MCG/2ML IJ SOLN
INTRAMUSCULAR | Status: AC
  Administered 2020-05-14: 50 ug via INTRAVENOUS
  Filled 2020-05-14: qty 2

## 2020-05-14 MED ORDER — CHLORHEXIDINE GLUCONATE 0.12 % MT SOLN
OROMUCOSAL | Status: AC
  Administered 2020-05-14: 15 mL via OROMUCOSAL
  Filled 2020-05-14: qty 15

## 2020-05-14 MED ORDER — SCOPOLAMINE 1 MG/3DAYS TD PT72
MEDICATED_PATCH | TRANSDERMAL | Status: AC
  Administered 2020-05-14: 1.5 mg via TRANSDERMAL
  Filled 2020-05-14: qty 1

## 2020-05-14 MED ORDER — PROMETHAZINE HCL 25 MG/ML IJ SOLN: 6.2500 mg | INTRAMUSCULAR | Status: DC | PRN

## 2020-05-14 MED ORDER — FENTANYL CITRATE (PF) 100 MCG/2ML IJ SOLN
INTRAMUSCULAR | Status: AC
  Administered 2020-05-14: 25 ug via INTRAVENOUS
  Filled 2020-05-14: qty 2

## 2020-05-14 MED ORDER — FENTANYL CITRATE (PF) 100 MCG/2ML IJ SOLN
INTRAMUSCULAR | Status: AC
  Filled 2020-05-14: qty 2

## 2020-05-14 MED ORDER — FENTANYL CITRATE (PF) 100 MCG/2ML IJ SOLN
25.0000 ug | INTRAMUSCULAR | Status: DC | PRN
  Administered 2020-05-14 (×3): 25 ug via INTRAVENOUS

## 2020-05-14 MED ORDER — GABAPENTIN 300 MG PO CAPS
ORAL_CAPSULE | ORAL | Status: AC
  Administered 2020-05-14: 600 mg via ORAL
  Filled 2020-05-14: qty 2

## 2020-05-14 MED ORDER — PROPOFOL 10 MG/ML IV BOLUS
INTRAVENOUS | Status: AC
  Filled 2020-05-14: qty 20

## 2020-05-14 MED ORDER — ACETAMINOPHEN 500 MG PO TABS
ORAL_TABLET | ORAL | Status: AC
  Filled 2020-05-14: qty 2

## 2020-05-14 MED ORDER — ENSURE PRE-SURGERY PO LIQD
296.0000 mL | Freq: Once | ORAL | Status: DC
  Filled 2020-05-14: qty 296

## 2020-05-14 MED ORDER — PROPOFOL 10 MG/ML IV BOLUS
INTRAVENOUS | Status: DC | PRN
  Administered 2020-05-14: 140 mg via INTRAVENOUS

## 2020-05-14 MED ORDER — MIDAZOLAM HCL 2 MG/2ML IJ SOLN
INTRAMUSCULAR | Status: DC | PRN
  Administered 2020-05-14: 2 mg via INTRAVENOUS

## 2020-05-14 MED ORDER — SUGAMMADEX SODIUM 200 MG/2ML IV SOLN
INTRAVENOUS | Status: DC | PRN
  Administered 2020-05-14: 200 mg via INTRAVENOUS

## 2020-05-14 SURGICAL SUPPLY — 62 items
ADH SKN CLS APL DERMABOND .7 (GAUZE/BANDAGES/DRESSINGS) ×2
APL PRP STRL LF DISP 70% ISPRP (MISCELLANEOUS) ×4
APL SRG 38 LTWT LNG FL B (MISCELLANEOUS)
APPLICATOR ARISTA FLEXITIP XL (MISCELLANEOUS) IMPLANT
BACTOSHIELD CHG 4% 4OZ (MISCELLANEOUS)
BAG DRN RND TRDRP ANRFLXCHMBR (UROLOGICAL SUPPLIES) ×2
BAG URINE DRAIN 2000ML AR STRL (UROLOGICAL SUPPLIES) ×3 IMPLANT
BINDER ABDOMINAL  9 SM 30-45 (SOFTGOODS) ×1
BINDER ABDOMINAL 9 SM 30-45 (SOFTGOODS) ×2 IMPLANT
BLADE SURG SZ11 CARB STEEL (BLADE) ×3 IMPLANT
CATH FOLEY 2WAY  5CC 16FR (CATHETERS) ×2
CATH FOLEY 2WAY 5CC 16FR (CATHETERS) ×2
CATH URTH 16FR FL 2W BLN LF (CATHETERS) ×2 IMPLANT
CHLORAPREP W/TINT 26 (MISCELLANEOUS) ×6 IMPLANT
COUNTER NEEDLE 20/40 LG (NEEDLE) ×3 IMPLANT
COVER WAND RF STERILE (DRAPES) ×3 IMPLANT
DEFOGGER SCOPE WARMER CLEARIFY (MISCELLANEOUS) ×3 IMPLANT
DERMABOND ADVANCED (GAUZE/BANDAGES/DRESSINGS) ×1
DERMABOND ADVANCED .7 DNX12 (GAUZE/BANDAGES/DRESSINGS) ×2 IMPLANT
DEVICE SUTURE ENDOST 10MM (ENDOMECHANICALS) IMPLANT
DRAPE 3/4 80X56 (DRAPES) ×3 IMPLANT
DRAPE LEGGINS SURG 28X43 STRL (DRAPES) ×3 IMPLANT
DRAPE UNDER BUTTOCK W/FLU (DRAPES) ×3 IMPLANT
ELECT REM PT RETURN 9FT ADLT (ELECTROSURGICAL) ×3
ELECTRODE REM PT RTRN 9FT ADLT (ELECTROSURGICAL) ×2 IMPLANT
GAUZE SPONGE 4X4 16PLY XRAY LF (GAUZE/BANDAGES/DRESSINGS) ×3 IMPLANT
GLOVE PI ORTHOPRO 6.5 (GLOVE) ×5
GLOVE PI ORTHOPRO STRL 6.5 (GLOVE) ×10 IMPLANT
GLOVE SURG SYN 6.5 ES PF (GLOVE) ×9 IMPLANT
GOWN STRL REUS W/ TWL LRG LVL3 (GOWN DISPOSABLE) ×6 IMPLANT
GOWN STRL REUS W/ TWL XL LVL3 (GOWN DISPOSABLE) ×4 IMPLANT
GOWN STRL REUS W/TWL LRG LVL3 (GOWN DISPOSABLE) ×9
GOWN STRL REUS W/TWL XL LVL3 (GOWN DISPOSABLE) ×6
HANDLE YANKAUER SUCT BULB TIP (MISCELLANEOUS) ×3 IMPLANT
HEMOSTAT ARISTA ABSORB 3G PWDR (HEMOSTASIS) IMPLANT
IRRIGATION STRYKERFLOW (MISCELLANEOUS) ×2 IMPLANT
IRRIGATOR STRYKERFLOW (MISCELLANEOUS) ×3
IV LACTATED RINGERS 1000ML (IV SOLUTION) ×3 IMPLANT
KIT PINK PAD W/HEAD ARE REST (MISCELLANEOUS) ×3
KIT PINK PAD W/HEAD ARM REST (MISCELLANEOUS) ×2 IMPLANT
KIT TURNOVER CYSTO (KITS) ×3 IMPLANT
L-HOOK LAP DISP 36CM (ELECTROSURGICAL) ×3
LHOOK LAP DISP 36CM (ELECTROSURGICAL) ×2 IMPLANT
LIGASURE VESSEL 5MM BLUNT TIP (ELECTROSURGICAL) ×3 IMPLANT
MANIFOLD NEPTUNE II (INSTRUMENTS) ×3 IMPLANT
MANIPULATOR VCARE STD CRV RETR (MISCELLANEOUS) ×3 IMPLANT
NS IRRIG 500ML POUR BTL (IV SOLUTION) ×3 IMPLANT
PACK LAP CHOLECYSTECTOMY (MISCELLANEOUS) ×3 IMPLANT
PAD OB MATERNITY 4.3X12.25 (PERSONAL CARE ITEMS) ×3 IMPLANT
PAD PREP 24X41 OB/GYN DISP (PERSONAL CARE ITEMS) ×3 IMPLANT
PENCIL ELECTRO HAND CTR (MISCELLANEOUS) ×3 IMPLANT
SCRUB CHG 4% DYNA-HEX 4OZ (MISCELLANEOUS) IMPLANT
SET CYSTO W/LG BORE CLAMP LF (SET/KITS/TRAYS/PACK) IMPLANT
SLEEVE ENDOPATH XCEL 5M (ENDOMECHANICALS) ×6 IMPLANT
SURGILUBE 2OZ TUBE FLIPTOP (MISCELLANEOUS) ×3 IMPLANT
SUT ENDO VLOC 180-0-8IN (SUTURE) IMPLANT
SUT MNCRL 4-0 (SUTURE) ×3
SUT MNCRL 4-0 27XMFL (SUTURE) ×2
SUT VIC AB 0 CT1 36 (SUTURE) ×6 IMPLANT
SUTURE MNCRL 4-0 27XMF (SUTURE) ×2 IMPLANT
TROCAR XCEL NON-BLD 5MMX100MML (ENDOMECHANICALS) ×3 IMPLANT
TUBING EVAC SMOKE HEATED PNEUM (TUBING) ×3 IMPLANT

## 2020-05-14 NOTE — Interval H&P Note (Signed)
History and Physical Interval Note:  05/14/2020 1:37 PM  Jessica Cross  has presented today for surgery, with the diagnosis of pelvic pain, fibroid.  The various methods of treatment have been discussed with the patient and family. After consideration of risks, benefits and other options for treatment, the patient has consented to  Procedure(s): TOTAL LAPAROSCOPIC HYSTERECTOMY WITH BILATERAL SALPINGECTOMY (Bilateral) as a surgical intervention.  The patient's history has been reviewed, patient examined, no change in status, stable for surgery.  I have reviewed the patient's chart and labs.  Questions were answered to the patient's satisfaction.     [place]

## 2020-05-14 NOTE — Anesthesia Procedure Notes (Signed)
Procedure Name: Intubation Performed by: Ricci Paff, CRNA Pre-anesthesia Checklist: Patient identified, Patient being monitored, Timeout performed, Emergency Drugs available and Suction available Patient Re-evaluated:Patient Re-evaluated prior to induction Oxygen Delivery Method: Circle system utilized Preoxygenation: Pre-oxygenation with 100% oxygen Induction Type: IV induction Ventilation: Mask ventilation without difficulty Laryngoscope Size: Mac and 3 Grade View: Grade I Tube type: Oral Tube size: 7.0 mm Number of attempts: 1 Airway Equipment and Method: Stylet Placement Confirmation: ETT inserted through vocal cords under direct vision,  positive ETCO2 and breath sounds checked- equal and bilateral Secured at: 22 cm Tube secured with: Tape Dental Injury: Teeth and Oropharynx as per pre-operative assessment        

## 2020-05-14 NOTE — Anesthesia Postprocedure Evaluation (Deleted)
Anesthesia Post Note  Patient: Jessica Cross  Procedure(s) Performed: TOTAL LAPAROSCOPIC HYSTERECTOMY WITH BILATERAL SALPINGECTOMY (Bilateral ) CYSTOSCOPY  Anesthesia Type: General Anesthetic complications: no   No complications documented.   Last Vitals:  Vitals:   05/14/20 1409 05/14/20 1627  BP: (!) 109/56   Pulse: 60   Resp: 16   Temp: (!) 36.1 C (!) (P) 36.2 C  SpO2: 99%     Last Pain:  Vitals:   05/14/20 1409  TempSrc: Temporal  PainSc: 0-No pain                 Jasmia Angst

## 2020-05-14 NOTE — Discharge Instructions (Signed)
Discharge instructions:  Call office if you have any of the following: fever >101 F, chills, shortness of breath, excessive vaginal bleeding, incision drainage or problems, leg pain or redness, or any other concerns.   Activity: Do not lift > 20 lbs for 8 weeks.  No intercourse or tampons for 8 weeks.  No driving until you are certain you can slam on the brakes, and of course never while taking narcotics.   You may feel some pain in your upper right abdomen/rib and right shoulder.  This is from the gas in the abdomen for surgery. This will subside over time, please be patient!  Take 800mg  Ibuprofen and 1000mg  Tylenol together, around the clock, every 6 hours for at least the first 3-5 days.  After this you can take as needed.  This will help decrease inflammation and promote healing.  The narcotics you'll take just as needed, as they just trick your brain into thinking its not in pain.    Please don't limit yourself in terms of routine activity.  You will be able to do most things, although they may take longer to do or be a little painful.  You can do it!  Don't be a hero, but don't be a wimp either!   AMBULATORY SURGERY  DISCHARGE INSTRUCTIONS   1) The drugs that you were given will stay in your system until tomorrow so for the next 24 hours you should not:  A) Drive an automobile B) Make any legal decisions C) Drink any alcoholic beverage   2) You may resume regular meals tomorrow.  Today it is better to start with liquids and gradually work up to solid foods.  You may eat anything you prefer, but it is better to start with liquids, then soup and crackers, and gradually work up to solid foods.   3) Please notify your doctor immediately if you have any unusual bleeding, trouble breathing, redness and pain at the surgery site, drainage, fever, or pain not relieved by medication.    4) Additional Instructions:    Please contact your physician with any problems or Same Day  Surgery at [phone], Monday through Friday 6 am to 4 pm, or [place] at [facility] number at [phone].

## 2020-05-14 NOTE — Anesthesia Postprocedure Evaluation (Signed)
Anesthesia Post Note  Patient: Jessica Cross  Procedure(s) Performed: TOTAL LAPAROSCOPIC HYSTERECTOMY WITH BILATERAL SALPINGECTOMY (Bilateral ) CYSTOSCOPY  Patient location during evaluation: PACU Anesthesia Type: General Level of consciousness: awake Pain management: pain level controlled Vital Signs Assessment: post-procedure vital signs reviewed and stable Respiratory status: spontaneous breathing Cardiovascular status: blood pressure returned to baseline Anesthetic complications: no   No complications documented.   Last Vitals:  Vitals:   05/14/20 1409 05/14/20 1627  BP: (!) 109/56   Pulse: 60   Resp: 16   Temp: (!) 36.1 C (!) (P) 36.2 C  SpO2: 99%     Last Pain:  Vitals:   05/14/20 1409  TempSrc: Temporal  PainSc: 0-No pain                 Oz Gammel

## 2020-05-14 NOTE — Op Note (Signed)
Total Laparoscopic Hysterectomy Operative Note Procedure Date: 05/14/2020  Patient:  Jessica Cross  29 y.o. female  PRE-OPERATIVE DIAGNOSIS:  pelvic pain, fibroid  POST-OPERATIVE DIAGNOSIS:  pelvic pain, fibroid  PROCEDURE:  Procedure(s): TOTAL LAPAROSCOPIC HYSTERECTOMY WITH BILATERAL SALPINGECTOMY (Bilateral)  Cystoscopy   SURGEON:  Surgeon(s) and Role:    * Jamella Grayer, Honor Loh, MD - Primary    * Schermerhorn, Gwen Her, MD - Assisting  ANESTHESIA:  General via ET  I/O  Total I/O In: 700 [I.V.:700] Out: 150 [Blood:150], urine, 150cc  FINDINGS:   Normal external genitalia, normal vagina and cervix. Normal sized uterus, with equal sized pedunculated posterior fibroid   normal ovaries and fallopian tubes bilaterally.   Normal upper abdomen. Normal bladder walls with no injury, bilateral ureteral jets.  SPECIMEN: Uterus, Cervix, and bilateral fallopian tubes  COMPLICATIONS: none apparent  DISPOSITION: vital signs stable to PACU  Indication for Surgery: 29 y.o. with pelvic pain and fibroid uterus requesting definitive intervention with hysterectomy.  Risks of surgery were discussed with the patient including but not limited to: bleeding which may require transfusion or reoperation; infection which may require antibiotics; injury to bowel, bladder, ureters or other surrounding organs; need for additional procedures including laparotomy, blood clot, incisional problems and other postoperative/anesthesia complications. Written informed consent was obtained.      PROCEDURE IN DETAIL:  The patient had 5000u Heparin Sub-q and sequential compression devices applied to her lower extremities while in the preoperative area.  She was then taken to the operating room. IV antibiotics were given. General anesthesia was administered via endotracheal route.  She was placed in the dorsal lithotomy position, and was prepped and draped in a sterile manner. A surgical time-out was performed.  A Foley  catheter was inserted into her bladder and attached to constant drainage and a V-Care uterine manipulator was then advanced into the uterus and a good fit around the cervix was noted. The gloves were changed, and attention was turned to the abdomen where an umbilical incision was made with the scalpel.  A 77mm trochar was inserted in the umbilical incision using a visiport method.Opening pressure was 18mmHg, and the abdomen was insufflated to 58mmHg carbon dioxide gas and adequate pneumoperitoneum was obtained. A survey of the patient's pelvis and abdomen revealed the findings as mentioned above. Two 53mm ports were inserted in the lower left and right quadrants under visualization.    The bilateral fallopian tubes were separated from the mesosalpinx using the Ligasure. The bilateral round ligaments were transected and anterior broad ligament divided and brought across the uterus to separate the vesicouterine peritoneum and create a bladder flap. The bladder was pushed away from the uterus. The bilateral uterine arteries were skeletonized, ligated and transected. The bilateral uterosacral and cardinal ligaments were ligated and transected. A colpotomy was made around the V-Care cervical cup and the uterus, cervix, and bilateral tubes were removed through the vagina. The vaginal cuff was closed vaginally using 0-Vicryl in a running stitch.  The pneumoperitoneum was recreated and surgical site inspected.  The left uterine vessel was bleeding and after confirming isolation from the left ureter was cauterized and found to be hemostatic. Bilateral ureters were visualized vermiuclating. No intraoperative injury to surrounding organs was noted.   The attention was turned to the bladder.  A 70-degree cystoscope was inserted into the urethra after the foley was removed.  The bladder was inflated with 300cc of normal saline, and inspection of the bladder wall was performed.  Bilateral ureteral jets  were observed.   The cystoscope was removed.  The abdomen was desufflated and all instruments were then removed. All skin incisions were closed with 4-0 monocryl and covered with surgical glue. The patient tolerated the procedures well.  All instruments, needles, and sponge counts were correct x 2. The patient was taken to the recovery room in stable condition.   ---- Larey Days, MD Attending Obstetrician and [facility]

## 2020-05-14 NOTE — Transfer of Care (Signed)
Immediate Anesthesia Transfer of Care Note  Patient: Jessica Cross  Procedure(s) Performed: TOTAL LAPAROSCOPIC HYSTERECTOMY WITH BILATERAL SALPINGECTOMY (Bilateral ) CYSTOSCOPY  Patient Location: PACU  Anesthesia Type:General  Level of Consciousness: awake  Airway & Oxygen Therapy: Patient Spontanous Breathing  Post-op Assessment: Post -op Vital signs reviewed and stable  Post vital signs: stable  Last Vitals:  Vitals Value Taken Time  BP 109/66 05/14/20 1627  Temp    Pulse 73 05/14/20 1631  Resp 17 05/14/20 1631  SpO2 100 % 05/14/20 1631  Vitals shown include unvalidated device data.  Last Pain:  Vitals:   05/14/20 1409  TempSrc: Temporal  PainSc: 0-No pain         Complications: No complications documented.

## 2020-05-14 NOTE — Anesthesia Preprocedure Evaluation (Signed)
Anesthesia Evaluation  Patient identified by MRN, date of birth, ID band Patient awake    Reviewed: Allergy & Precautions, NPO status , Patient's Chart, lab work & pertinent test results  History of Anesthesia Complications Negative for: history of anesthetic complications  Airway Mallampati: III  TM Distance: >3 FB Neck ROM: Full    Dental no notable dental hx.    Pulmonary former smoker,    breath sounds clear to auscultation- rhonchi (-) wheezing      Cardiovascular Exercise Tolerance: Good (-) hypertension(-) CAD, (-) Past MI, (-) Cardiac Stents and (-) CABG  Rhythm:Regular Rate:Normal - Systolic murmurs and - Diastolic murmurs    Neuro/Psych  Headaches, PSYCHIATRIC DISORDERS Anxiety Depression    GI/Hepatic negative GI ROS, Neg liver ROS,   Endo/Other  negative endocrine ROS  Renal/GU negative Renal ROS     Musculoskeletal negative musculoskeletal ROS (+)   Abdominal (+) + obese,   Peds  Hematology  (+) anemia ,   Anesthesia Other Findings Past Medical History: No date: Anemia     Comment:  H/O  No date: Anxiety No date: Asthma     Comment:  as a child  No date: Depression No date: Migraine No date: Tobacco use   Reproductive/Obstetrics                             Anesthesia Physical Anesthesia Plan  ASA: II  Anesthesia Plan: General   Post-op Pain Management:    Induction: Intravenous  PONV Risk Score and Plan: 2 and Ondansetron, Dexamethasone and Midazolam  Airway Management Planned: Oral ETT  Additional Equipment:   Intra-op Plan:   Post-operative Plan: Extubation in OR  Informed Consent: I have reviewed the patients History and Physical, chart, labs and discussed the procedure including the risks, benefits and alternatives for the proposed anesthesia with the patient or authorized representative who has indicated his/her understanding and acceptance.      Dental advisory given  Plan Discussed with: CRNA and Anesthesiologist  Anesthesia Plan Comments:         Anesthesia Quick Evaluation

## 2020-05-15 ENCOUNTER — Encounter: Payer: Self-pay | Admitting: Obstetrics & Gynecology

## 2020-05-16 LAB — SURGICAL PATHOLOGY

## 2020-05-22 ENCOUNTER — Other Ambulatory Visit: Payer: Self-pay | Admitting: Nurse Practitioner

## 2020-05-22 NOTE — Telephone Encounter (Signed)
Pt verbalized understanding and is asking if you will take her on as her new PCP.

## 2020-05-22 NOTE — Telephone Encounter (Signed)
Previous RL patient.

## 2020-05-22 NOTE — Telephone Encounter (Signed)
   Notes to clinic:  Patient requesting a 90 day Review for change    Requested Prescriptions  Pending Prescriptions Disp Refills   busPIRone (BUSPAR) 15 MG tablet [Pharmacy Med Name: BUSPIRONE HCL 15 MG TABLET] 180 tablet 1    Sig: TAKE 1 TABLET BY MOUTH 2 TIMES DAILY.      Psychiatry: Anxiolytics/Hypnotics - Non-controlled Failed - 05/22/2020  8:31 AM      Failed - Valid encounter within last 6 months    Recent Outpatient Visits           6 months ago Upper respiratory tract infection, unspecified type   [facility], [place]   9 months ago [place], [place], [place]   10 months ago Epigastric pain   [facility] [place], [place]   1 year ago Sore throat   [facility] Roof [place], [place]   1 year ago Pharyngitis, unspecified etiology   [place], [place], [place]

## 2020-06-27 ENCOUNTER — Other Ambulatory Visit: Payer: Commercial Managed Care - PPO

## 2020-06-27 DIAGNOSIS — Z20822 Contact with and (suspected) exposure to covid-19: Secondary | ICD-10-CM

## 2020-06-29 ENCOUNTER — Ambulatory Visit: Payer: Self-pay | Admitting: *Deleted

## 2020-06-29 ENCOUNTER — Ambulatory Visit: Payer: Commercial Managed Care - PPO | Admitting: Nurse Practitioner

## 2020-06-29 LAB — NOVEL CORONAVIRUS, NAA: SARS-CoV-2, NAA: DETECTED — AB

## 2020-06-29 LAB — SARS-COV-2, NAA 2 DAY TAT

## 2020-06-29 NOTE — Telephone Encounter (Signed)
Left a message for pt to return the call for her recent test result.

## 2020-07-20 ENCOUNTER — Other Ambulatory Visit: Payer: Self-pay

## 2020-07-20 ENCOUNTER — Encounter: Payer: Self-pay | Admitting: Nurse Practitioner

## 2020-07-20 ENCOUNTER — Ambulatory Visit (INDEPENDENT_AMBULATORY_CARE_PROVIDER_SITE_OTHER): Payer: Commercial Managed Care - PPO | Admitting: Nurse Practitioner

## 2020-07-20 VITALS — BP 109/73 | HR 76 | Temp 98.6°F | Ht 60.71 in | Wt 156.8 lb

## 2020-07-20 DIAGNOSIS — R41840 Attention and concentration deficit: Secondary | ICD-10-CM | POA: Diagnosis not present

## 2020-07-20 DIAGNOSIS — F419 Anxiety disorder, unspecified: Secondary | ICD-10-CM

## 2020-07-20 DIAGNOSIS — F331 Major depressive disorder, recurrent, moderate: Secondary | ICD-10-CM

## 2020-07-20 DIAGNOSIS — R253 Fasciculation: Secondary | ICD-10-CM

## 2020-07-20 DIAGNOSIS — F909 Attention-deficit hyperactivity disorder, unspecified type: Secondary | ICD-10-CM | POA: Insufficient documentation

## 2020-07-20 MED ORDER — BUPROPION HCL ER (XL) 150 MG PO TB24: 150.0000 mg | ORAL_TABLET | Freq: Every day | ORAL | 4 refills | Status: DC

## 2020-07-20 NOTE — Assessment & Plan Note (Signed)
Chronic, ongoing.  Has tried various medications in past with increased anxiety symptoms reported.  At this time will trial Wellbutrin 150 MG daily and slower reduce to discontinue Buspar.  Script sent.  She denies SI/HI.  Will also obtain ADHD testing further, referral to [facility].  If poor tolerance Wellbutrin could consider SNRI or possibly trial Seroquel.  May also benefit from therapy on regular basis.  Return in 4 weeks.

## 2020-07-20 NOTE — Assessment & Plan Note (Signed)
Some elevation in ADD testing today.  Will send referral to [facility], ?possible underlying cause for increase anxiety.

## 2020-07-20 NOTE — Assessment & Plan Note (Signed)
Chronic, ongoing.  Has tried various medications in past with increased anxiety symptoms reported.  At this time will trial Wellbutrin 150 MG daily and slower reduce to discontinue Buspar.  Script sent.  She denies SI/HI.  Will also obtain ADHD testing further, referral to [facility].  If poor tolerance Wellbutrin could consider SNRI or possibly trial Seroquel.  Return in 4 weeks.

## 2020-07-20 NOTE — Patient Instructions (Signed)
[email or web address]">  Generalized Anxiety Disorder, Adult Generalized anxiety disorder (GAD) is a mental health condition. Unlike normal worries, anxiety related to GAD is not triggered by a specific event. These worries do not fade or get better with time. GAD interferes with relationships, work, and school. GAD symptoms can vary from mild to severe. People with severe GAD can have intense waves of anxiety with physical symptoms that are similar to panic attacks. What are the causes? The exact cause of GAD is not known, but the following are believed to have an impact:  Differences in natural brain chemicals.  Genes passed down from parents to children.  Differences in the way threats are perceived.  Development during childhood.  Personality. What increases the risk? The following factors may make you more likely to develop this condition:  Being female.  Having a family history of anxiety disorders.  Being very shy.  Experiencing very stressful life events, such as the death of a loved one.  Having a very stressful family environment. What are the signs or symptoms? People with GAD often worry excessively about many things in their lives, such as their health and family. Symptoms may also include:  Mental and emotional symptoms: ? Worrying excessively about natural disasters. ? Fear of being late. ? Difficulty concentrating. ? Fears that others are judging your performance.  Physical symptoms: ? Fatigue. ? Headaches, muscle tension, muscle twitches, trembling, or feeling shaky. ? Feeling like your heart is pounding or beating very fast. ? Feeling out of breath or like you cannot take a deep breath. ? Having trouble falling asleep or staying asleep, or experiencing restlessness. ? Sweating. ? Nausea, diarrhea, or irritable bowel syndrome (IBS).  Behavioral symptoms: ? Experiencing erratic moods or irritability. ? Avoidance of new situations. ? Avoidance of  people. ? Extreme difficulty making decisions. How is this diagnosed? This condition is diagnosed based on your symptoms and medical history. You will also have a physical exam. Your health care provider may perform tests to rule out other possible causes of your symptoms. To be diagnosed with GAD, a person must have anxiety that:  Is out of his or her control.  Affects several different aspects of his or her life, such as work and relationships.  Causes distress that makes him or her unable to take part in normal activities.  Includes at least three symptoms of GAD, such as restlessness, fatigue, trouble concentrating, irritability, muscle tension, or sleep problems. Before your health care provider can confirm a diagnosis of GAD, these symptoms must be present more days than they are not, and they must last for 6 months or longer. How is this treated? This condition may be treated with:  Medicine. Antidepressant medicine is usually prescribed for long-term daily control. Anti-anxiety medicines may be added in severe cases, especially when panic attacks occur.  Talk therapy (psychotherapy). Certain types of talk therapy can be helpful in treating GAD by providing support, education, and guidance. Options include: ? Cognitive behavioral therapy (CBT). People learn coping skills and self-calming techniques to ease their physical symptoms. They learn to identify unrealistic thoughts and behaviors and to replace them with more appropriate thoughts and behaviors. ? Acceptance and commitment therapy (ACT). This treatment teaches people how to be mindful as a way to cope with unwanted thoughts and feelings. ? Biofeedback. This process trains you to manage your body's response (physiological response) through breathing techniques and relaxation methods. You will work with a therapist while machines are used to monitor your physical   symptoms.  Stress management techniques. These include yoga,  meditation, and exercise. A mental health specialist can help determine which treatment is best for you. Some people see improvement with one type of therapy. However, other people require a combination of therapies.   Follow these instructions at home: Lifestyle  Maintain a consistent routine and schedule.  Anticipate stressful situations. Create a plan, and allow extra time to work with your plan.  Practice stress management or self-calming techniques that you have learned from your therapist or your health care provider. General instructions  Take over-the-counter and prescription medicines only as told by your health care provider.  Understand that you are likely to have setbacks. Accept this and be kind to yourself as you persist to take better care of yourself.  Recognize and accept your accomplishments, even if you judge them as small.  Keep all follow-up visits as told by your health care provider. This is important. Contact a health care provider if:  Your symptoms do not get better.  Your symptoms get worse.  You have signs of depression, such as: ? A persistently sad or irritable mood. ? Loss of enjoyment in activities that used to bring you joy. ? Change in weight or eating. ? Changes in sleeping habits. ? Avoiding friends or family members. ? Loss of energy for normal tasks. ? Feelings of guilt or worthlessness. Get help right away if:  You have serious thoughts about hurting yourself or others. If you ever feel like you may hurt yourself or others, or have thoughts about taking your own life, get help right away. Go to your nearest emergency department or:  Call your local emergency services (911 in the [place]  Call a suicide crisis helpline, such as the National Suicide Prevention Lifeline at 1-800-273-8255. This is open 24 hours a day in the [place]  Text the Crisis Text Line at 741741 (in the [place] Summary  Generalized anxiety disorder (GAD) is a mental  health condition that involves worry that is not triggered by a specific event.  People with GAD often worry excessively about many things in their lives, such as their health and family.  GAD may cause symptoms such as restlessness, trouble concentrating, sleep problems, frequent sweating, nausea, diarrhea, headaches, and trembling or muscle twitching.  A mental health specialist can help determine which treatment is best for you. Some people see improvement with one type of therapy. However, other people require a combination of therapies. This information is not intended to replace advice given to you by your health care provider. Make sure you discuss any questions you have with your health care provider. Document Revised: 03/30/2019 Document Reviewed: 03/30/2019 Elsevier Patient Education  2021 Elsevier Inc.  

## 2020-07-20 NOTE — Progress Notes (Signed)
BP 109/73   Pulse 76   Temp 98.6 F (37 C) (Oral)   Ht 5' 0.71" (1.542 m)   Wt 156 lb 12.8 oz (71.1 kg)   LMP 04/27/2020   SpO2 96%   BMI 29.91 kg/m    Subjective:    Patient ID: Jessica Cross, female    DOB: 27-Jul-1990, 30 y.o.   MRN: [id number]  HPI: Jessica Cross is a 30 y.o. female  Chief Complaint  Patient presents with  . Anxiety  . All over twitching    Patient states that ever since she had a hysterectomy in November, she has had twitching all over her body   ANXIETY/STRESS/DEPRESSION Continues on Buspar 15 MG BID -- she reports this is not offering much benefit -- makes her high as a kite.  In past has tried -- Celexa which caused severe panic attacks and took Prozac for 2-3 years, developed anxiety on this medication.  Her mother had depression years ago, but not anymore -- during divorce.  Father and siblings with no mental health issues that she knows of.   Duration:uncontrolled Anxious mood: yes  Excessive worrying: yes Irritability: yes  Sweating: sometimes Nausea: sometimes Palpitations: in past -- takes Metoprolol for PAC Hyperventilation: no Panic attacks: very rarely Agoraphobia: no  Obscessions/compulsions: no Depressed mood: yes Depression screen [facility] 2/9 07/20/2020 08/16/2019 04/08/2018 10/17/2015 01/22/2015  Decreased Interest 1 2 0 0 1  Down, Depressed, Hopeless 0 2 0 0 0  PHQ - 2 Score 1 4 0 0 1  Altered sleeping 1 1 1  - 2  Tired, decreased energy 3 2 1  - 2  Change in appetite 1 0 0 - 1  Feeling bad or failure about yourself  0 1 0 - 1  Trouble concentrating 3 1 0 - 0  Moving slowly or fidgety/restless 0 0 0 - 0  Suicidal thoughts 0 0 0 - 0  PHQ-9 Score 9 9 2  - 7  Difficult doing work/chores - Somewhat difficult - - -   Anhedonia: no Weight changes: yes Insomnia: yes hard to fall asleep  Hypersomnia: yes Fatigue/loss of energy: yes Feelings of worthlessness: no Feelings of guilt: no Impaired concentration/indecisiveness: yes Suicidal  ideations: no  Crying spells: no Recent Stressors/Life Changes: no   Relationship problems: no   Family stress: no     Financial stress: no    Job stress: no    Recent death/loss: no GAD 7 : Generalized Anxiety Score 07/20/2020 08/16/2019 04/08/2018 03/26/2017  Nervous, Anxious, on Edge 3 2 1 3   Control/stop worrying 3 3 2 3   Worry too much - different things 3 3 2 3   Trouble relaxing 1 1 0 2  Restless 0 1 0 2  Easily annoyed or irritable 1 2 1 3   Afraid - awful might happen 3 2 2 3   Total GAD 7 Score 14 14 8 19   Anxiety Difficulty - Somewhat difficult - Very difficult   Adult ADHD Self Report Scale (most recent)    Adult ADHD Self-Report Scale (ASRS-v1.1) Symptom Checklist - 07/20/20 1412      Part A   1. How often do you have trouble wrapping up the final details of a project, once the challenging parts have been done? Often  2. How often do you have difficulty getting things done in order when you have to do a task that requires organization? Never    3. How often do you have problems remembering appointments or obligations? Never  4. When you have a task that requires a lot of thought, how often do you avoid or delay getting started? Sometimes    5. How often do you fidget or squirm with your hands or feet when you have to sit down for a long time? Very Often  6. How often do you feel overly active and compelled to do things, like you were driven by a motor? Sometimes      Part B   7. How often do you make careless mistakes when you have to work on a boring or difficult project? Never  8. How often do you have difficulty keeping your attention when you are doing boring or repetitive work? Often    9. How often do you have difficulty concentrating on what people say to you, even when they are speaking to you directly? Very Often  10. How often do you misplace or have difficulty finding things at home or at work? Never    11. How often are you distracted by activity or noise around you?  Never  12. How often do you leave your seat in meetings or other situations in which you are expected to remain seated? Never    13. How often do you feel restless or fidgety? Very Often  46. How often do you have difficulty unwinding and relaxing when you have time to yourself? Never    15. How often do you find yourself talking too much when you are in social situations? Never  16. When you are in a conversation, how often do you find yourself finishing the sentences of the people you are talking to, before they can finish them themselves? Never    17. How often do you have difficulty waiting your turn in situations when turn taking is required? Often  18. How often do you interrupt others when they are busy? Never      Comment   How old were you when these problems first began to occur? 18             BODY TWITCHING Reports that since hysterectomy has had twitching all over her body.  Has procedure performed 05/14/20 with Dr. Leonides Schanz -- she told her GYN at follow-up and they told her to talk to PCP.  Has history of Covid 06/27/20.  This twitching is constant, started in her right thigh muscle and then one day transferred to other areas.  Face, eyes, shoulders, back, stomach, calves.  Se has been taking magnesium every day, as advised by OB/GYN and this is not helping.  Had general anesthesia.  Notices it most when sitting still.  Feels like worm under skin.    Relevant past medical, surgical, family and social history reviewed and updated as indicated. Interim medical history since our last visit reviewed. Allergies and medications reviewed and updated.  Review of Systems  Constitutional: Negative for activity change, appetite change, chills, fatigue and fever.  Respiratory: Negative for cough, chest tightness and wheezing.   Gastrointestinal: Negative.   Endocrine: Negative for cold intolerance and heat intolerance.  Neurological: Positive for tremors (twitches). Negative for dizziness,  weakness, light-headedness, numbness and headaches.    Per HPI unless specifically indicated above     Objective:    BP 109/73   Pulse 76   Temp 98.6 F (37 C) (Oral)   Ht 5' 0.71" (1.542 m)   Wt 156 lb 12.8 oz (71.1 kg)   LMP 04/27/2020   SpO2 96%   BMI 29.91  kg/m   Wt Readings from Last 3 Encounters:  07/20/20 156 lb 12.8 oz (71.1 kg)  05/14/20 150 lb (68 kg)  05/04/20 154 lb (69.9 kg)    Physical Exam Vitals and nursing note reviewed.  Constitutional:      General: She is awake. She is not in acute distress.    Appearance: She is well-developed and well-groomed. She is not ill-appearing.  HENT:     Head: Normocephalic.     Right Ear: Hearing normal.     Left Ear: Hearing normal.  Eyes:     General: Lids are normal.        Right eye: No discharge.        Left eye: No discharge.     Conjunctiva/sclera: Conjunctivae normal.     Pupils: Pupils are equal, round, and reactive to light.  Neck:     Thyroid: No thyromegaly.     Vascular: No carotid bruit or JVD.  Cardiovascular:     Rate and Rhythm: Normal rate and regular rhythm.     Heart sounds: Normal heart sounds. No murmur heard. No gallop.   Pulmonary:     Effort: Pulmonary effort is normal. No accessory muscle usage or respiratory distress.     Breath sounds: Normal breath sounds.  Abdominal:     General: Bowel sounds are normal.     Palpations: Abdomen is soft.  Musculoskeletal:     Cervical back: Normal range of motion and neck supple.     Right lower leg: No edema.     Left lower leg: No edema.  Lymphadenopathy:     Cervical: No cervical adenopathy.  Skin:    General: Skin is warm and dry.  Neurological:     Mental Status: She is alert and oriented to person, place, and time.  Psychiatric:        Attention and Perception: Attention normal.        Mood and Affect: Mood normal.        Speech: Speech normal.        Behavior: Behavior normal. Behavior is cooperative.        Thought Content: Thought  content normal.     Results for orders placed or performed in visit on 06/27/20  Novel Coronavirus, NAA ([facility])   Specimen: Nasopharyngeal(NP) swabs in vial transport medium   Nasopharynge  Result Value Ref Range   SARS-CoV-2, NAA Detected (A) Not Detected  SARS-COV-2, NAA 2 DAY TAT   Nasopharynge  Result Value Ref Range   SARS-CoV-2, NAA 2 DAY TAT Performed       Assessment & Plan:   Problem List Items Addressed This Visit      Other   Depression - Primary    Chronic, ongoing.  Has tried various medications in past with increased anxiety symptoms reported.  At this time will trial Wellbutrin 150 MG daily and slower reduce to discontinue Buspar.  Script sent.  She denies SI/HI.  Will also obtain ADHD testing further, referral to [facility].  If poor tolerance Wellbutrin could consider SNRI or possibly trial Seroquel.  May also benefit from therapy on regular basis.  Return in 4 weeks.      Relevant Medications   buPROPion (WELLBUTRIN XL) 150 MG 24 hr tablet   Anxiety    Chronic, ongoing.  Has tried various medications in past with increased anxiety symptoms reported.  At this time will trial Wellbutrin 150 MG daily and slower reduce to discontinue Buspar.  Script  sent.  She denies SI/HI.  Will also obtain ADHD testing further, referral to [facility].  If poor tolerance Wellbutrin could consider SNRI or possibly trial Seroquel.  Return in 4 weeks.      Relevant Medications   buPROPion (WELLBUTRIN XL) 150 MG 24 hr tablet   Twitching    Since hysterectomy.  ?related to anxiety.  Will obtain labs today to include CBC, Mag, CMP, TSH.  Continue Mag supplement at home.  Return in 4 weeks.      Relevant Orders   CBC with Differential/Platelet   Comprehensive metabolic panel   TSH   Magnesium   Lack of concentration    Some elevation in ADD testing today.  Will send referral to [facility], ?possible underlying cause for  increase anxiety.      Relevant Orders   Ambulatory referral to Psychiatry       Follow up plan: Return in about 4 weeks (around 08/17/2020) for Mood and twitching.

## 2020-07-20 NOTE — Assessment & Plan Note (Signed)
Since hysterectomy.  ?related to anxiety.  Will obtain labs today to include CBC, Mag, CMP, TSH.  Continue Mag supplement at home.  Return in 4 weeks.

## 2020-07-21 LAB — COMPREHENSIVE METABOLIC PANEL
ALT: 15 IU/L (ref 0–32)
AST: 22 IU/L (ref 0–40)
Albumin/Globulin Ratio: 2.1 (ref 1.2–2.2)
Albumin: 4.9 g/dL — ABNORMAL HIGH (ref 3.8–4.8)
Alkaline Phosphatase: 59 IU/L (ref 44–121)
BUN/Creatinine Ratio: 22 (ref 9–23)
BUN: 16 mg/dL (ref 6–20)
Bilirubin Total: 0.3 mg/dL (ref 0.0–1.2)
CO2: 21 mmol/L (ref 20–29)
Calcium: 10 mg/dL (ref 8.7–10.2)
Chloride: 100 mmol/L (ref 96–106)
Creatinine, Ser: 0.74 mg/dL (ref 0.57–1.00)
GFR calc Af Amer: 125 mL/min/{1.73_m2} (ref >59)
GFR calc non Af Amer: 108 mL/min/{1.73_m2} (ref >59)
Globulin, Total: 2.3 g/dL (ref 1.5–4.5)
Glucose: 77 mg/dL (ref 65–99)
Potassium: 4.5 mmol/L (ref 3.5–5.2)
Sodium: 138 mmol/L (ref 134–144)
Total Protein: 7.2 g/dL (ref 6.0–8.5)

## 2020-07-21 LAB — CBC WITH DIFFERENTIAL/PLATELET
Basophils Absolute: 0 10*3/uL (ref 0.0–0.2)
Basos: 0 %
EOS (ABSOLUTE): 0.1 10*3/uL (ref 0.0–0.4)
Eos: 2 %
Hematocrit: 42.5 % (ref 34.0–46.6)
Hemoglobin: 14 g/dL (ref 11.1–15.9)
Immature Grans (Abs): 0 10*3/uL (ref 0.0–0.1)
Immature Granulocytes: 0 %
Lymphocytes Absolute: 1.7 10*3/uL (ref 0.7–3.1)
Lymphs: 35 %
MCH: 29.5 pg (ref 26.6–33.0)
MCHC: 32.9 g/dL (ref 31.5–35.7)
MCV: 90 fL (ref 79–97)
Monocytes Absolute: 0.3 10*3/uL (ref 0.1–0.9)
Monocytes: 7 %
Neutrophils Absolute: 2.7 10*3/uL (ref 1.4–7.0)
Neutrophils: 56 %
Platelets: 255 10*3/uL (ref 150–450)
RBC: 4.74 x10E6/uL (ref 3.77–5.28)
RDW: 11.8 % (ref 11.7–15.4)
WBC: 4.8 10*3/uL (ref 3.4–10.8)

## 2020-07-21 LAB — TSH: TSH: 1.41 u[IU]/mL (ref 0.450–4.500)

## 2020-07-21 LAB — MAGNESIUM: Magnesium: 2.2 mg/dL (ref 1.6–2.3)

## 2020-07-21 NOTE — Progress Notes (Signed)
Contacted via MyChart   Good evening Jessica Cross, all of your labs have returned and are nice and normal.  No concerning findings.  Any questions please let me know. Keep being awesome!!  Thank you for allowing me to participate in your care. Kindest regards, Itzael Liptak

## 2020-07-23 ENCOUNTER — Other Ambulatory Visit: Payer: Self-pay | Admitting: Nurse Practitioner

## 2020-07-23 DIAGNOSIS — R253 Fasciculation: Secondary | ICD-10-CM

## 2020-08-13 ENCOUNTER — Other Ambulatory Visit: Payer: Self-pay | Admitting: Nurse Practitioner

## 2020-08-16 ENCOUNTER — Other Ambulatory Visit: Payer: Self-pay | Admitting: Nurse Practitioner

## 2020-08-17 ENCOUNTER — Ambulatory Visit (INDEPENDENT_AMBULATORY_CARE_PROVIDER_SITE_OTHER): Payer: Commercial Managed Care - PPO | Admitting: Nurse Practitioner

## 2020-08-17 ENCOUNTER — Other Ambulatory Visit: Payer: Self-pay

## 2020-08-17 ENCOUNTER — Encounter: Payer: Self-pay | Admitting: Nurse Practitioner

## 2020-08-17 VITALS — BP 95/62 | HR 61 | Temp 98.9°F | Wt 156.2 lb

## 2020-08-17 DIAGNOSIS — I491 Atrial premature depolarization: Secondary | ICD-10-CM

## 2020-08-17 DIAGNOSIS — R41840 Attention and concentration deficit: Secondary | ICD-10-CM

## 2020-08-17 DIAGNOSIS — R253 Fasciculation: Secondary | ICD-10-CM

## 2020-08-17 DIAGNOSIS — F331 Major depressive disorder, recurrent, moderate: Secondary | ICD-10-CM

## 2020-08-17 DIAGNOSIS — F419 Anxiety disorder, unspecified: Secondary | ICD-10-CM

## 2020-08-17 MED ORDER — BUPROPION HCL ER (XL) 150 MG PO TB24
150.0000 mg | ORAL_TABLET | Freq: Every day | ORAL | 4 refills | Status: DC
Start: 2020-08-17 — End: 2021-05-27

## 2020-08-17 MED ORDER — METOPROLOL SUCCINATE ER 25 MG PO TB24
12.5000 mg | ORAL_TABLET | Freq: Every day | ORAL | 3 refills | Status: DC
  Filled 2021-06-03: qty 45, 90d supply, fill #0

## 2020-08-17 NOTE — Patient Instructions (Signed)

## 2020-08-17 NOTE — Assessment & Plan Note (Signed)
Chronic, ongoing. At this time continue Wellbutrin 150 MG daily and stop Buspar as is having benefit with Wellbutrin on board -- scores improving GAD 14 to 7 and PHQ9 -- 9 to 7.  Refills sent.  She denies SI/HI.  ?more attention deficit element vs anxiety.  Will also obtain ADHD testing further, referral to [facility] last visit, will check on this.  If poor tolerance Wellbutrin could consider SNRI or possibly trial Seroquel.  May also benefit from therapy on regular basis.  Return in 8 weeks.

## 2020-08-17 NOTE — Progress Notes (Signed)
BP 95/62   Pulse 61   Temp 98.9 F (37.2 C) (Oral)   Wt 156 lb 3.2 oz (70.9 kg)   LMP 04/27/2020   SpO2 97%   BMI 29.80 kg/m    Subjective:    Patient ID: Jessica Cross, female    DOB: 1991/02/02, 30 y.o.   MRN: [id number]  HPI: Jessica Cross is a 30 y.o. female  Chief Complaint  Patient presents with  . Mood    Patient states she would like to discuss current blood pressure as it was low and she read on Google that interactions of two of her current medications can cause low blood pressure.   . Twitching   ANXIETY/STRESS/DEPRESSION Was on Buspar 15 MG BID -- she reports this was not offering much benefit -- makes her high as a kite -- so last visit on 07/20/20 started Wellbutrin.  In past has tried -- Celexa which caused severe panic attacks and took Prozac for 2-3 years, developed anxiety on this medication.  Her mother had depression years ago, but not anymore -- during divorce.  Father and siblings with no mental health issues that she knows of.   Duration: stable Anxious mood: yes  Excessive worrying: yes Irritability: yes  Sweating: sometimes Nausea: sometimes Palpitations: in past -- takes Metoprolol for PAC Hyperventilation: no Panic attacks: very rarely Agoraphobia: no  Obscessions/compulsions: no Depressed mood: yes Depression screen [facility] 2/9 08/17/2020 07/20/2020 08/16/2019 04/08/2018 10/17/2015  Decreased Interest 1 1 2  0 0  Down, Depressed, Hopeless 1 0 2 0 0  PHQ - 2 Score 2 1 4  0 0  Altered sleeping 0 1 1 1  -  Tired, decreased energy 2 3 2 1  -  Change in appetite 0 1 0 0 -  Feeling bad or failure about yourself  1 0 1 0 -  Trouble concentrating 2 3 1  0 -  Moving slowly or fidgety/restless 0 0 0 0 -  Suicidal thoughts 0 0 0 0 -  PHQ-9 Score 7 9 9 2  -  Difficult doing work/chores Somewhat difficult - Somewhat difficult - -   Anhedonia: no Weight changes: yes Insomnia: yes hard to fall asleep  Hypersomnia: yes Fatigue/loss of energy: yes Feelings of  worthlessness: no Feelings of guilt: no Impaired concentration/indecisiveness: yes Suicidal ideations: no  Crying spells: no Recent Stressors/Life Changes: no   Relationship problems: no   Family stress: no     Financial stress: no    Job stress: no    Recent death/loss: no GAD 7 : Generalized Anxiety Score 08/17/2020 07/20/2020 08/16/2019 04/08/2018  Nervous, Anxious, on Edge 1 3 2 1   Control/stop worrying 1 3 3 2   Worry too much - different things 1 3 3 2   Trouble relaxing 1 1 1  0  Restless 0 0 1 0  Easily annoyed or irritable 1 1 2 1   Afraid - awful might happen 2 3 2 2   Total GAD 7 Score 7 14 14 8   Anxiety Difficulty Somewhat difficult - Somewhat difficult -    BODY TWITCHING Reports that since hysterectomy has had twitching all over her body.  Has procedure performed 05/14/20 with Dr. Leonides Schanz -- she told her GYN at follow-up and they told her to talk to PCP.  Has history of Covid 06/27/20.  This twitching is constant, started in her right thigh muscle and then one day transferred to other areas.  Face, eyes, shoulders, back, stomach, calves.  Se has been taking magnesium every day,  as advised by OB/GYN and this is not helping.  Had general anesthesia.  Notices it most when sitting still. Feels like worms under skin.  Recent labs were all unremarkable and she has referral to neurology at [facility] and ADHD testing in [place], has not heard from them yet.    Relevant past medical, surgical, family and social history reviewed and updated as indicated. Interim medical history since our last visit reviewed. Allergies and medications reviewed and updated.  Review of Systems  Constitutional: Negative for activity change, appetite change, chills, fatigue and fever.  Respiratory: Negative for cough, chest tightness and wheezing.   Gastrointestinal: Negative.   Endocrine: Negative for cold intolerance and heat intolerance.  Neurological: Positive for tremors (twitches). Negative for dizziness, weakness,  light-headedness, numbness and headaches.    Per HPI unless specifically indicated above     Objective:    BP 95/62   Pulse 61   Temp 98.9 F (37.2 C) (Oral)   Wt 156 lb 3.2 oz (70.9 kg)   LMP 04/27/2020   SpO2 97%   BMI 29.80 kg/m   Wt Readings from Last 3 Encounters:  08/17/20 156 lb 3.2 oz (70.9 kg)  07/20/20 156 lb 12.8 oz (71.1 kg)  05/14/20 150 lb (68 kg)    Physical Exam Vitals and nursing note reviewed.  Constitutional:      General: She is awake. She is not in acute distress.    Appearance: She is well-developed and well-groomed. She is not ill-appearing.  HENT:     Head: Normocephalic.     Right Ear: Hearing normal.     Left Ear: Hearing normal.  Eyes:     General: Lids are normal.        Right eye: No discharge.        Left eye: No discharge.     Conjunctiva/sclera: Conjunctivae normal.     Pupils: Pupils are equal, round, and reactive to light.  Neck:     Thyroid: No thyromegaly.     Vascular: No carotid bruit or JVD.  Cardiovascular:     Rate and Rhythm: Normal rate and regular rhythm.     Heart sounds: Normal heart sounds. No murmur heard. No gallop.   Pulmonary:     Effort: Pulmonary effort is normal. No accessory muscle usage or respiratory distress.     Breath sounds: Normal breath sounds.  Abdominal:     General: Bowel sounds are normal.     Palpations: Abdomen is soft.  Musculoskeletal:     Cervical back: Normal range of motion and neck supple.     Right lower leg: No edema.     Left lower leg: No edema.  Lymphadenopathy:     Cervical: No cervical adenopathy.  Skin:    General: Skin is warm and dry.  Neurological:     Mental Status: She is alert and oriented to person, place, and time.  Psychiatric:        Attention and Perception: Attention normal.        Mood and Affect: Mood normal.        Speech: Speech normal.        Behavior: Behavior normal. Behavior is cooperative.        Thought Content: Thought content normal.      Results for orders placed or performed in visit on 07/20/20  CBC with Differential/Platelet  Result Value Ref Range   WBC 4.8 3.4 - 10.8 x10E3/uL   RBC 4.74 3.77 - 5.28 x10E6/uL  Hemoglobin 14.0 11.1 - 15.9 g/dL   Hematocrit 42.5 34.0 - 46.6 %   MCV 90 79 - 97 fL   MCH 29.5 26.6 - 33.0 pg   MCHC 32.9 31.5 - 35.7 g/dL   RDW 11.8 11.7 - 15.4 %   Platelets 255 150 - 450 x10E3/uL   Neutrophils 56 Not Estab. %   Lymphs 35 Not Estab. %   Monocytes 7 Not Estab. %   Eos 2 Not Estab. %   Basos 0 Not Estab. %   Neutrophils Absolute 2.7 1.4 - 7.0 x10E3/uL   Lymphocytes Absolute 1.7 0.7 - 3.1 x10E3/uL   Monocytes Absolute 0.3 0.1 - 0.9 x10E3/uL   EOS (ABSOLUTE) 0.1 0.0 - 0.4 x10E3/uL   Basophils Absolute 0.0 0.0 - 0.2 x10E3/uL   Immature Granulocytes 0 Not Estab. %   Immature Grans (Abs) 0.0 0.0 - 0.1 x10E3/uL  Comprehensive metabolic panel  Result Value Ref Range   Glucose 77 65 - 99 mg/dL   BUN 16 6 - 20 mg/dL   Creatinine, Ser 0.74 0.57 - 1.00 mg/dL   GFR calc non Af Amer 108 >59 mL/min/1.73   GFR calc Af Amer 125 >59 mL/min/1.73   BUN/Creatinine Ratio 22 9 - 23   Sodium 138 134 - 144 mmol/L   Potassium 4.5 3.5 - 5.2 mmol/L   Chloride 100 96 - 106 mmol/L   CO2 21 20 - 29 mmol/L   Calcium 10.0 8.7 - 10.2 mg/dL   Total Protein 7.2 6.0 - 8.5 g/dL   Albumin 4.9 (H) 3.8 - 4.8 g/dL   Globulin, Total 2.3 1.5 - 4.5 g/dL   Albumin/Globulin Ratio 2.1 1.2 - 2.2   Bilirubin Total 0.3 0.0 - 1.2 mg/dL   Alkaline Phosphatase 59 44 - 121 IU/L   AST 22 0 - 40 IU/L   ALT 15 0 - 32 IU/L  TSH  Result Value Ref Range   TSH 1.410 0.450 - 4.500 uIU/mL  Magnesium  Result Value Ref Range   Magnesium 2.2 1.6 - 2.3 mg/dL      Assessment & Plan:   Problem List Items Addressed This Visit      Cardiovascular and Mediastinum   PAC (premature atrial contraction)    Currently taking Metoprolol XL 25 MG, will reduce this to 12.5 MG daily as is now taking Wellbutrin which can enhance effects.   Suspect in future may not need Metoprolol XL if anxiety improved.  Monitor closely and further reduce as needed.      Relevant Medications   metoprolol succinate (TOPROL-XL) 25 MG 24 hr tablet     Other   Depression - Primary    Chronic, ongoing. At this time continue Wellbutrin 150 MG daily and stop Buspar as is having benefit with Wellbutrin on board -- scores improving GAD 14 to 7 and PHQ9 -- 9 to 7.  Refills sent.  She denies SI/HI.  Will also obtain ADHD testing further, referral to [facility] last visit, will check on this.  If poor tolerance Wellbutrin could consider SNRI or possibly trial Seroquel.  May also benefit from therapy on regular basis.  Return in 8 weeks.      Relevant Medications   buPROPion (WELLBUTRIN XL) 150 MG 24 hr tablet   Anxiety    Chronic, ongoing. At this time continue Wellbutrin 150 MG daily and stop Buspar as is having benefit with Wellbutrin on board -- scores improving GAD 14 to 7 and PHQ9 -- 9 to 7.  Refills sent.  She denies SI/HI.  ?more attention deficit element vs anxiety.  Will also obtain ADHD testing further, referral to [facility] last visit, will check on this.  If poor tolerance Wellbutrin could consider SNRI or possibly trial Seroquel.  May also benefit from therapy on regular basis.  Return in 8 weeks.      Relevant Medications   buPROPion (WELLBUTRIN XL) 150 MG 24 hr tablet   Twitching    Since hysterectomy.  ?related to anxiety.  Recent labs unremarkable.  Continue Mag supplement at home.  Has referral to neurology in place for further evaluation, will check on this referral.      Lack of concentration    Some elevation in ADD testing noted last visit.  Sent referral to [facility] last visit, will check on this, ?possible underlying cause for increase anxiety as is noticing benefit with current Wellbutrin regimen.          Follow up plan: Return in about 8 weeks (around  10/12/2020) for MOOD .

## 2020-08-17 NOTE — Assessment & Plan Note (Signed)
Since hysterectomy.  ?related to anxiety.  Recent labs unremarkable.  Continue Mag supplement at home.  Has referral to neurology in place for further evaluation, will check on this referral.

## 2020-08-17 NOTE — Assessment & Plan Note (Signed)
Chronic, ongoing. At this time continue Wellbutrin 150 MG daily and stop Buspar as is having benefit with Wellbutrin on board -- scores improving GAD 14 to 7 and PHQ9 -- 9 to 7.  Refills sent.  She denies SI/HI.  Will also obtain ADHD testing further, referral to [facility] last visit, will check on this.  If poor tolerance Wellbutrin could consider SNRI or possibly trial Seroquel.  May also benefit from therapy on regular basis.  Return in 8 weeks.

## 2020-08-17 NOTE — Assessment & Plan Note (Signed)
Currently taking Metoprolol XL 25 MG, will reduce this to 12.5 MG daily as is now taking Wellbutrin which can enhance effects.  Suspect in future may not need Metoprolol XL if anxiety improved.  Monitor closely and further reduce as needed.

## 2020-08-17 NOTE — Assessment & Plan Note (Addendum)
Some elevation in ADD testing noted last visit.  Sent referral to [facility] last visit, will check on this, ?possible underlying cause for increase anxiety as is noticing benefit with current Wellbutrin regimen.

## 2020-08-31 IMAGING — US US ABDOMEN COMPLETE
1 series · 14 of 25 positions shown · non-contrast
Comparison: None.

CLINICAL DATA: Upper abdominal pain for 7 8 months.

EXAM:
ABDOMEN ULTRASOUND COMPLETE

[Series 1: us abdomen complete · 0.17mm/px · 14 of 106 slices shown]
[im 1/106]
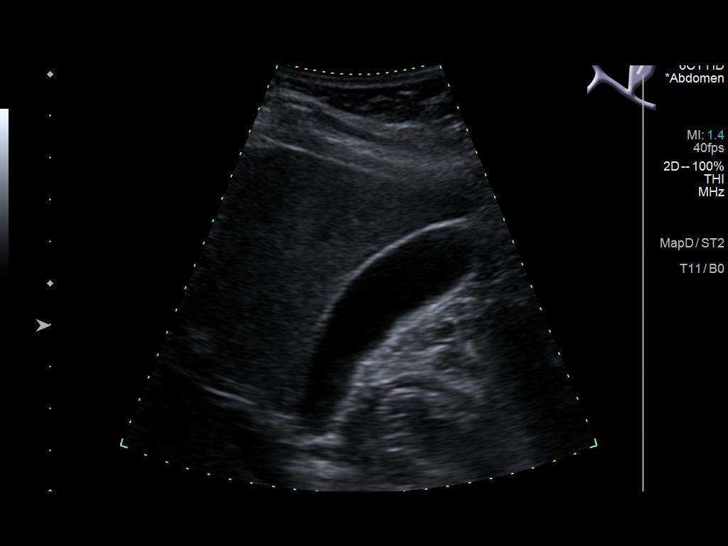
[im 9/106]
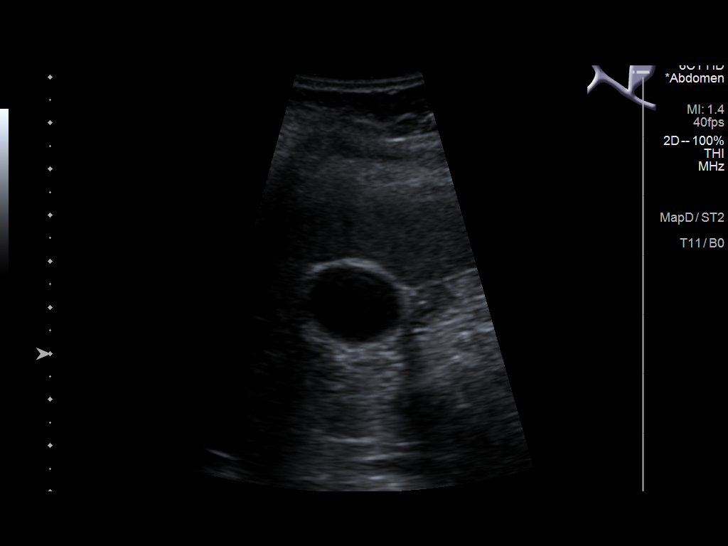
[im 18/106]
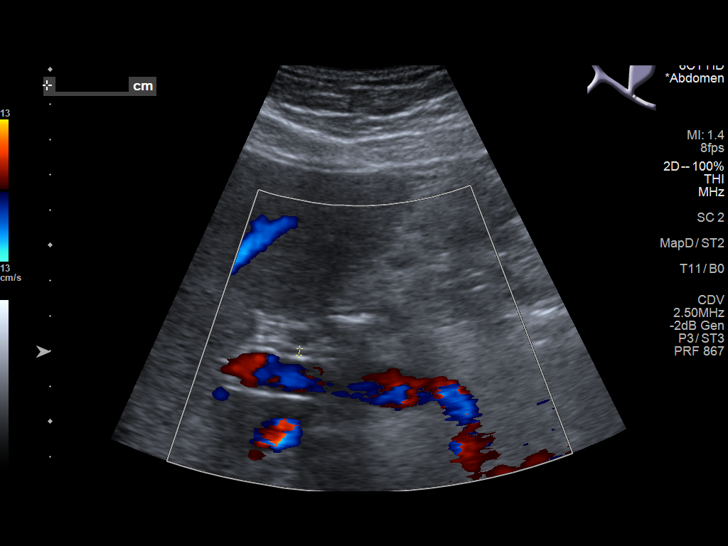
[im 27/106]
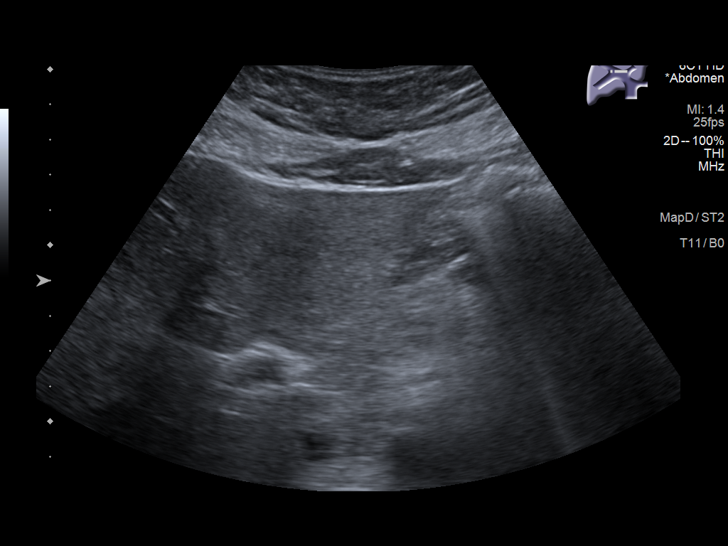
[im 36/106]
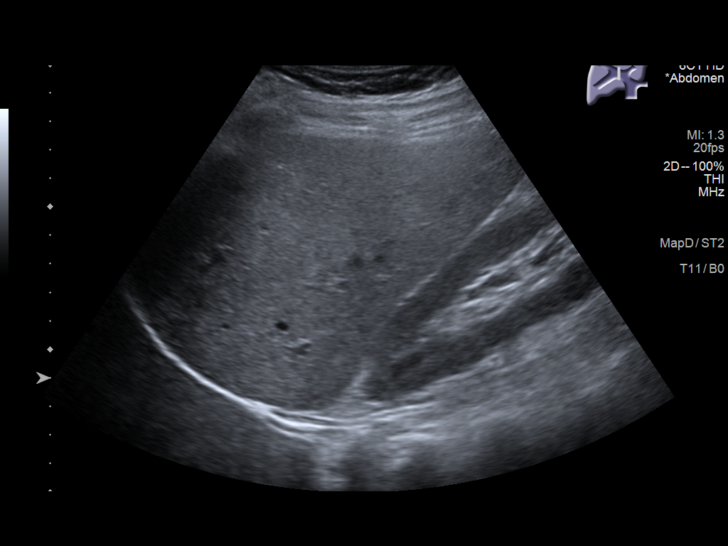
[im 40/106]
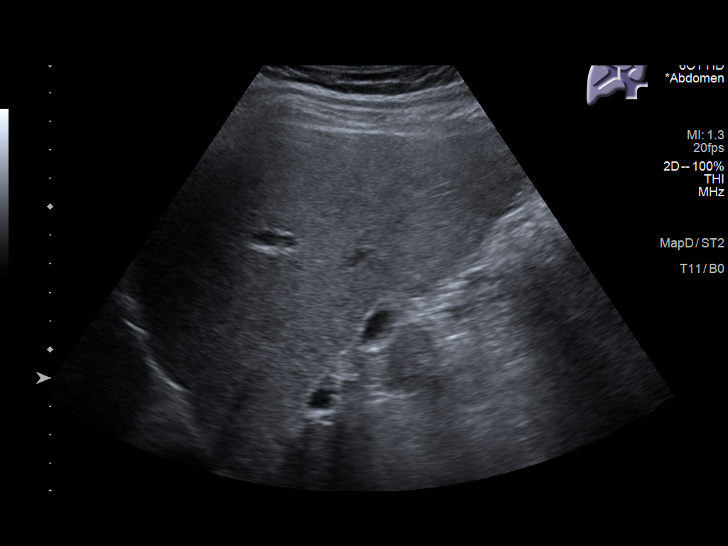
[im 49/106]
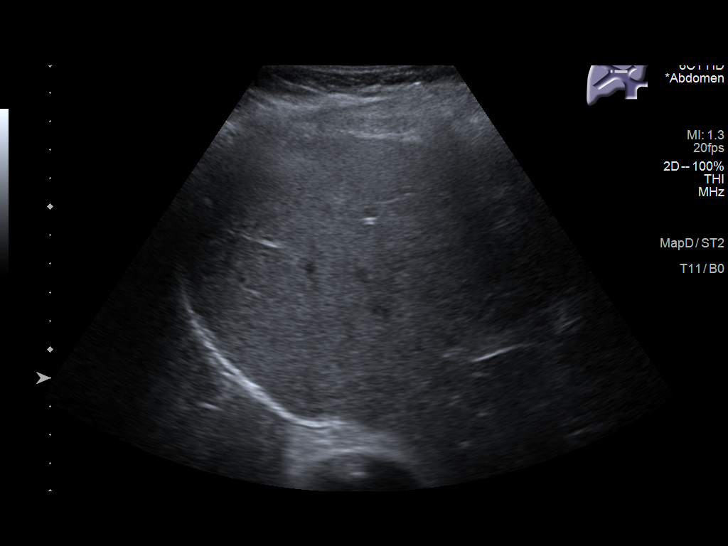
[im 57/106]
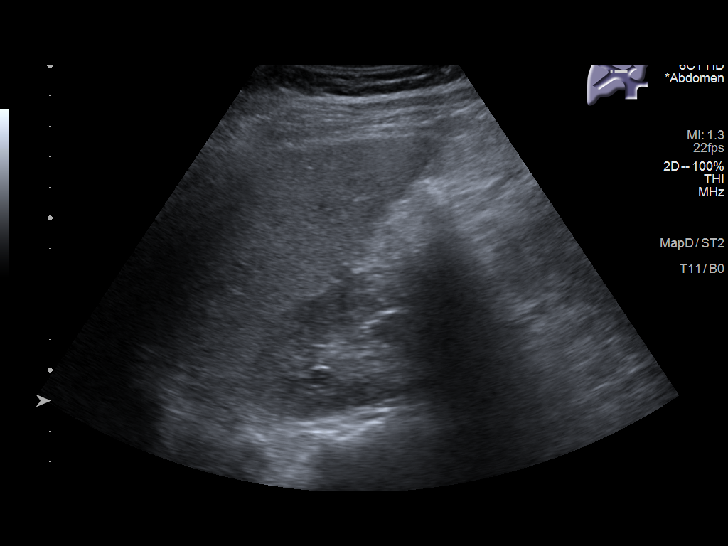
[im 66/106]
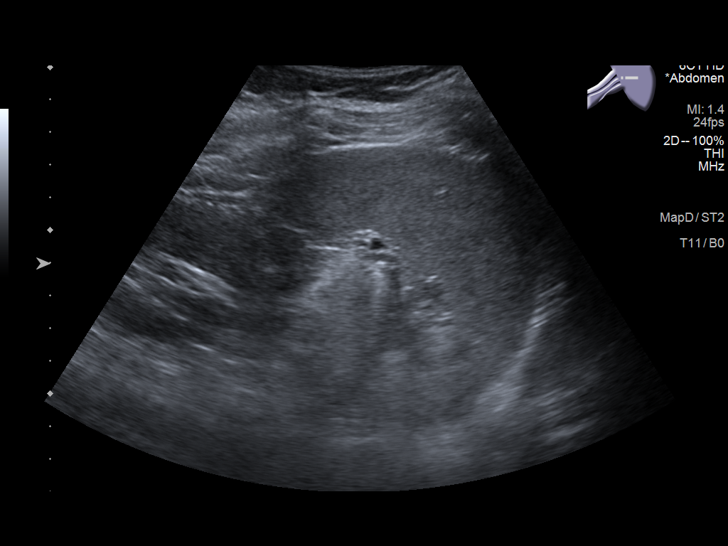
[im 71/106]
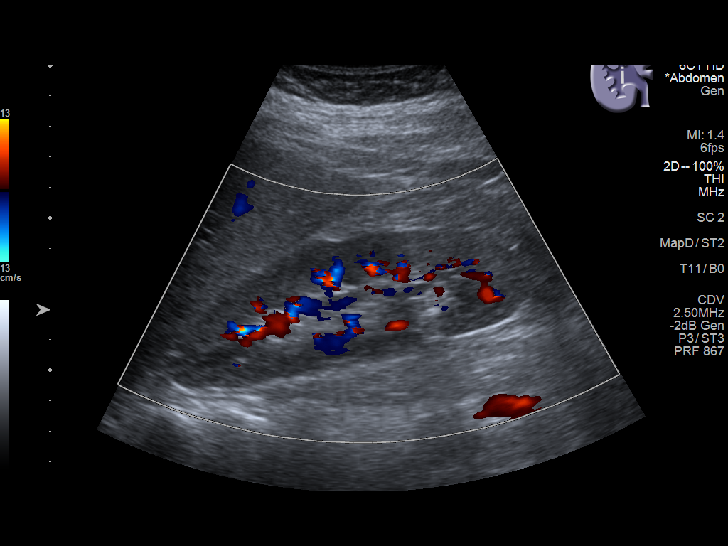
[im 79/106]
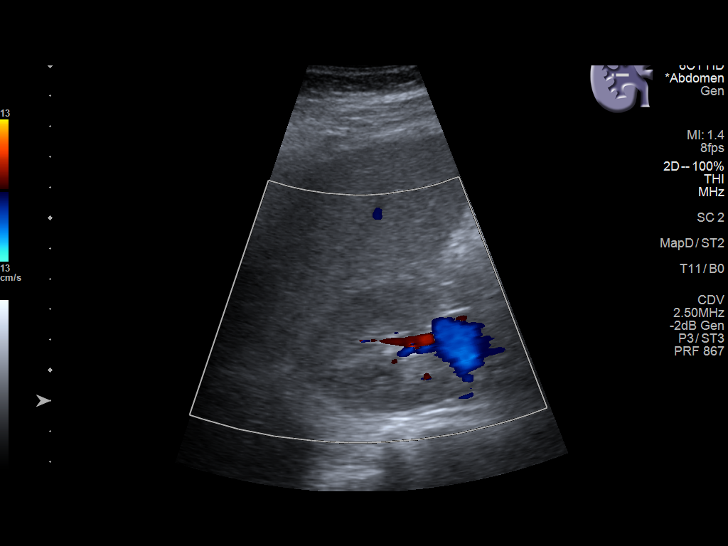
[im 88/106]
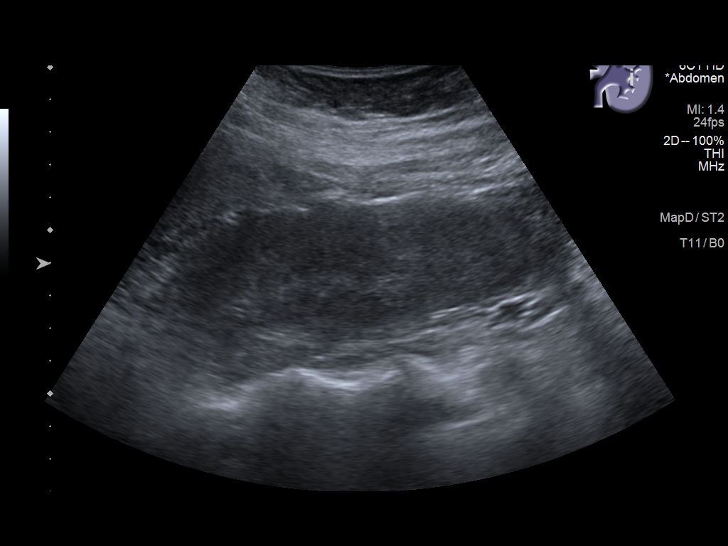
[im 97/106]
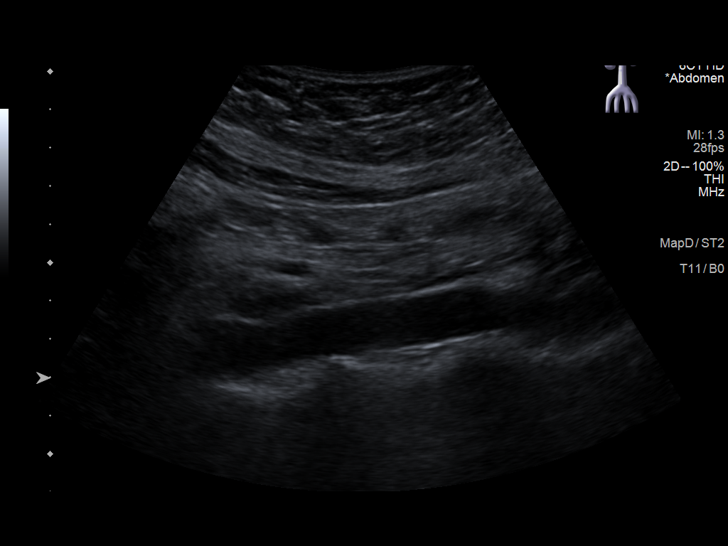
[im 106/106]
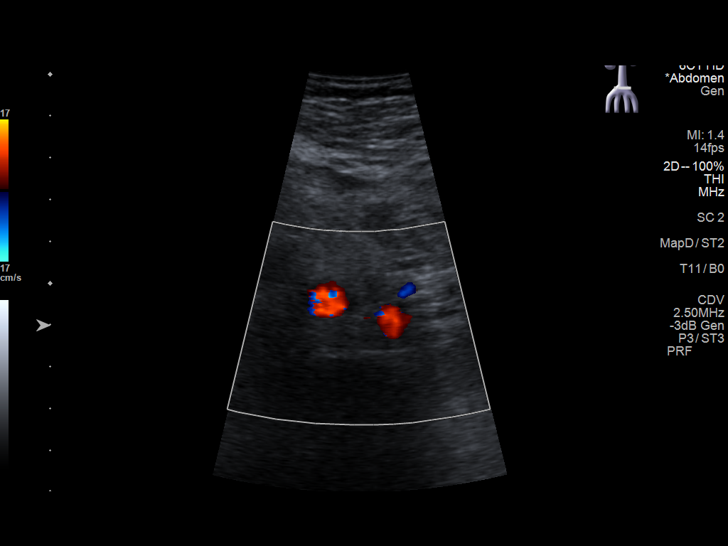

[14 of 25 positions shown; findings below may reference images not displayed]

FINDINGS: Gallbladder: No gallstones or wall thickening visualized. No
sonographic Murphy sign noted by sonographer.

Common bile duct: Diameter: 0.2 cm

Liver: No focal lesion identified. Within normal limits in
parenchymal echogenicity. Portal vein is patent on color Doppler
imaging with normal direction of blood flow towards the liver.

IVC: No abnormality visualized.

Pancreas: Visualized portion unremarkable.

Spleen: Size and appearance within normal limits.

Right Kidney: Length: 11.8 cm. Echogenicity within normal limits. No
mass or hydronephrosis visualized.

Left Kidney: Length: 13.2 cm. Echogenicity within normal limits. No
mass or hydronephrosis visualized.

Abdominal aorta: No aneurysm visualized.

Other findings: None.
IMPRESSION: Normal examination.

## 2020-10-12 ENCOUNTER — Other Ambulatory Visit: Payer: Self-pay

## 2020-10-12 ENCOUNTER — Encounter: Payer: Self-pay | Admitting: Nurse Practitioner

## 2020-10-12 ENCOUNTER — Telehealth (INDEPENDENT_AMBULATORY_CARE_PROVIDER_SITE_OTHER): Payer: Commercial Managed Care - PPO | Admitting: Nurse Practitioner

## 2020-10-12 ENCOUNTER — Ambulatory Visit: Payer: Commercial Managed Care - PPO | Admitting: Nurse Practitioner

## 2020-10-12 DIAGNOSIS — F331 Major depressive disorder, recurrent, moderate: Secondary | ICD-10-CM | POA: Diagnosis not present

## 2020-10-12 DIAGNOSIS — R253 Fasciculation: Secondary | ICD-10-CM | POA: Diagnosis not present

## 2020-10-12 DIAGNOSIS — R41840 Attention and concentration deficit: Secondary | ICD-10-CM | POA: Diagnosis not present

## 2020-10-12 DIAGNOSIS — F419 Anxiety disorder, unspecified: Secondary | ICD-10-CM

## 2020-10-12 NOTE — Assessment & Plan Note (Addendum)
Since hysterectomy.  ?related to anxiety.  Recent labs unremarkable.  Continue Mag supplement at home.  Has referral to neurology in place and visit scheduled for July. Appreciate their input.

## 2020-10-12 NOTE — Assessment & Plan Note (Signed)
Chronic, ongoing. At this time continue Wellbutrin 150 MG XL daily as is offering benefit to mood with PHQ9 from 7 to 4 and GAD from 7 to 6.  Refills up to date.  Could consider increase to 300 MG XL tablet in future if needed.  She denies SI/HI.  Will also obtain ADHD testing further, referral to [facility] last visit and is working on paperwork for them.  If poor tolerance Wellbutrin could consider SNRI or possibly trial Seroquel.  May also benefit from therapy on regular basis.  Return in August for follow-up.

## 2020-10-12 NOTE — Patient Instructions (Signed)

## 2020-10-12 NOTE — Progress Notes (Signed)
LMP 04/27/2020    Subjective:    Patient ID: Jessica Cross, female    DOB: Aug 31, 1990, 30 y.o.   MRN: [id number]  HPI: Jessica Cross is a 30 y.o. female  Chief Complaint  Patient presents with  . Mood    Patient states she is doing well on the new medication.     . This visit was completed via video visit through MyChart due to the restrictions of the COVID-19 pandemic. All issues as above were discussed and addressed. Physical exam was done as above through visual confirmation on video through MyChart. If it was felt that the patient should be evaluated in the office, they were directed there. The patient verbally consented to this visit. . Location of the patient: home . Location of the provider: home . Those involved with this call:  . Provider: Marnee Guarneri, DNP . CMA: Irena Reichmann, CMA . Front Desk/Registration: Jill Side  . Time spent on call: 21 minutes with patient face to face via video conference. More than 50% of this time was spent in counseling and coordination of care. 15 minutes total spent in review of patient's record and preparation of their chart.  . I verified patient identity using two factors (patient name and date of birth). Patient consents verbally to being seen via telemedicine visit today.    ANXIETY/STRESS/DEPRESSION Continues on Wellbutrin XL 150 MG daily.  In past has tried -- Celexa which caused severe panic attacks and took Prozac for 2-3 years, developed anxiety on this medication + Buspar.  Her mother had depression years ago, but not anymore -- during divorce.  Father and siblings with no mental health issues that she knows of.    She reports overall improvement in mood with Wellbutrin, but does continue to struggle with concentration.  Is working on paperwork for [employer] for further testing. Duration: stable Anxious mood: improved Excessive worrying: improving Irritability: yes  Sweating: none Nausea:  none Palpitations: in past -- takes Metoprolol for PAC Hyperventilation: no Panic attacks: very rarely Agoraphobia: no  Obscessions/compulsions: no Depressed mood: improved Depression screen [facility] 2/9 10/12/2020 08/17/2020 07/20/2020 08/16/2019 04/08/2018  Decreased Interest 1 1 1 2  0  Down, Depressed, Hopeless 0 1 0 2 0  PHQ - 2 Score 1 2 1 4  0  Altered sleeping 0 0 1 1 1   Tired, decreased energy 3 2 3 2 1   Change in appetite 0 0 1 0 0  Feeling bad or failure about yourself  0 1 0 1 0  Trouble concentrating 0 2 3 1  0  Moving slowly or fidgety/restless 0 0 0 0 0  Suicidal thoughts 0 0 0 0 0  PHQ-9 Score 4 7 9 9 2   Difficult doing work/chores - Somewhat difficult - Somewhat difficult -   Anhedonia: no Weight changes: yes Insomnia: none  Hypersomnia: yes Fatigue/loss of energy: yes Feelings of worthlessness: no Feelings of guilt: no Impaired concentration/indecisiveness: yes Suicidal ideations: no  Crying spells: no Recent Stressors/Life Changes: no   Relationship problems: no   Family stress: no     Financial stress: no    Job stress: no    Recent death/loss: no GAD 7 : Generalized Anxiety Score 10/12/2020 08/17/2020 07/20/2020 08/16/2019  Nervous, Anxious, on Edge 1 1 3 2   Control/stop worrying 1 1 3 3   Worry too much - different things 1 1 3 3   Trouble relaxing 0 1 1 1   Restless 0 0 0 1  Easily annoyed  or irritable 3 1 1 2   Afraid - awful might happen 0 2 3 2   Total GAD 7 Score 6 7 14 14   Anxiety Difficulty - Somewhat difficult - Somewhat difficult    BODY TWITCHING Reports that since hysterectomy has had twitching all over her body.  Has procedure performed 05/14/20 with Dr. Leonides Schanz -- she told her GYN at follow-up and they told her to talk to PCP.  Has history of Covid 06/27/20.  This twitching is constant, started in her right thigh muscle and then one day transferred to other areas.  Face, eyes, shoulders, back, stomach, calves.  Se has been taking magnesium every day, as  advised by OB/GYN and this is not helping.  Had general anesthesia.  Notices it most when sitting still. Feels like worms under skin.  Recent labs were all unremarkable and she has referral to neurology at [facility] and ADHD testing in [place].  Is to send paperwork back for ADHD testing and then will appointment.  [facility] neurology office has not called as of yet, she is instead going to [place] on July 1st.    Relevant past medical, surgical, family and social history reviewed and updated as indicated. Interim medical history since our last visit reviewed. Allergies and medications reviewed and updated.  Review of Systems  Constitutional: Negative for activity change, appetite change, chills, fatigue and fever.  Respiratory: Negative for cough, chest tightness and wheezing.   Gastrointestinal: Negative.   Endocrine: Negative for cold intolerance and heat intolerance.  Neurological: Positive for tremors (twitches). Negative for dizziness, weakness, light-headedness, numbness and headaches.    Per HPI unless specifically indicated above     Objective:    LMP 04/27/2020   Wt Readings from Last 3 Encounters:  08/17/20 156 lb 3.2 oz (70.9 kg)  07/20/20 156 lb 12.8 oz (71.1 kg)  05/14/20 150 lb (68 kg)    Physical Exam Vitals and nursing note reviewed.  Constitutional:      General: She is awake. She is not in acute distress.    Appearance: She is well-developed. She is not ill-appearing.  HENT:     Head: Normocephalic.     Right Ear: Hearing normal.     Left Ear: Hearing normal.  Eyes:     General: Lids are normal.        Right eye: No discharge.        Left eye: No discharge.     Conjunctiva/sclera: Conjunctivae normal.  Pulmonary:     Effort: Pulmonary effort is normal. No accessory muscle usage or respiratory distress.  Musculoskeletal:     Cervical back: Normal range of motion.  Neurological:     Mental Status: She is alert and oriented to person, place, and time.  Psychiatric:         Attention and Perception: Attention normal.        Mood and Affect: Mood normal.        Behavior: Behavior normal. Behavior is cooperative.        Thought Content: Thought content normal.        Judgment: Judgment normal.    Results for orders placed or performed in visit on 07/20/20  CBC with Differential/Platelet  Result Value Ref Range   WBC 4.8 3.4 - 10.8 x10E3/uL   RBC 4.74 3.77 - 5.28 x10E6/uL   Hemoglobin 14.0 11.1 - 15.9 g/dL   Hematocrit 42.5 34.0 - 46.6 %   MCV 90 79 - 97 fL   MCH 29.5 26.6 -  33.0 pg   MCHC 32.9 31.5 - 35.7 g/dL   RDW 11.8 11.7 - 15.4 %   Platelets 255 150 - 450 x10E3/uL   Neutrophils 56 Not Estab. %   Lymphs 35 Not Estab. %   Monocytes 7 Not Estab. %   Eos 2 Not Estab. %   Basos 0 Not Estab. %   Neutrophils Absolute 2.7 1.4 - 7.0 x10E3/uL   Lymphocytes Absolute 1.7 0.7 - 3.1 x10E3/uL   Monocytes Absolute 0.3 0.1 - 0.9 x10E3/uL   EOS (ABSOLUTE) 0.1 0.0 - 0.4 x10E3/uL   Basophils Absolute 0.0 0.0 - 0.2 x10E3/uL   Immature Granulocytes 0 Not Estab. %   Immature Grans (Abs) 0.0 0.0 - 0.1 x10E3/uL  Comprehensive metabolic panel  Result Value Ref Range   Glucose 77 65 - 99 mg/dL   BUN 16 6 - 20 mg/dL   Creatinine, Ser 0.74 0.57 - 1.00 mg/dL   GFR calc non Af Amer 108 >59 mL/min/1.73   GFR calc Af Amer 125 >59 mL/min/1.73   BUN/Creatinine Ratio 22 9 - 23   Sodium 138 134 - 144 mmol/L   Potassium 4.5 3.5 - 5.2 mmol/L   Chloride 100 96 - 106 mmol/L   CO2 21 20 - 29 mmol/L   Calcium 10.0 8.7 - 10.2 mg/dL   Total Protein 7.2 6.0 - 8.5 g/dL   Albumin 4.9 (H) 3.8 - 4.8 g/dL   Globulin, Total 2.3 1.5 - 4.5 g/dL   Albumin/Globulin Ratio 2.1 1.2 - 2.2   Bilirubin Total 0.3 0.0 - 1.2 mg/dL   Alkaline Phosphatase 59 44 - 121 IU/L   AST 22 0 - 40 IU/L   ALT 15 0 - 32 IU/L  TSH  Result Value Ref Range   TSH 1.410 0.450 - 4.500 uIU/mL  Magnesium  Result Value Ref Range   Magnesium 2.2 1.6 - 2.3 mg/dL      Assessment & Plan:   Problem List Items  Addressed This Visit      Other   Depression - Primary    Chronic, ongoing. At this time continue Wellbutrin 150 MG XL daily as is offering benefit to mood with PHQ9 from 7 to 4 and GAD from 7 to 6.  Refills up to date.  Could consider increase to 300 MG XL tablet in future if needed.  She denies SI/HI.  Will also obtain ADHD testing further, referral to [facility] last visit and is working on paperwork for them.  If poor tolerance Wellbutrin could consider SNRI or possibly trial Seroquel.  May also benefit from therapy on regular basis.  Return in August for follow-up.      Anxiety    Chronic, ongoing. Refer to depression plan of care for further.  She denies SI/HI.  ?more attention deficit element vs anxiety.  Will also obtain ADHD testing further, referral to [facility] last visit, she is filling paperwork out for their clinic.  If poor tolerance Wellbutrin could consider SNRI or possibly trial Seroquel.  May also benefit from therapy on regular basis.  Return in August for follow-up.      Twitching    Since hysterectomy.  ?related to anxiety.  Recent labs unremarkable.  Continue Mag supplement at home.  Has referral to neurology in place and visit scheduled for July. Appreciate their input.      Lack of concentration    Has referral in to [facility] and is working on paperwork for them now.  Continue Wellbutrin at this time and can adjust dose if needed in future.  Return in August for follow-up.         I discussed the assessment and treatment plan with the patient. The patient was provided an opportunity to ask questions and all were answered. The patient agreed with the plan and demonstrated an understanding of the instructions.   The patient was advised to call back or seek an in-person evaluation if the symptoms worsen or if the condition fails to improve as anticipated.   I provided 21+ minutes of time during this  encounter.  Follow up plan: Return in about 4 months (around 02/06/2021) for MOOD AND TWITCHING.

## 2020-10-12 NOTE — Assessment & Plan Note (Signed)
Chronic, ongoing. Refer to depression plan of care for further.  She denies SI/HI.  ?more attention deficit element vs anxiety.  Will also obtain ADHD testing further, referral to [facility] last visit, she is filling paperwork out for their clinic.  If poor tolerance Wellbutrin could consider SNRI or possibly trial Seroquel.  May also benefit from therapy on regular basis.  Return in August for follow-up.

## 2020-10-12 NOTE — Assessment & Plan Note (Signed)
Has referral in to [facility] and is working on paperwork for them now.  Continue Wellbutrin at this time and can adjust dose if needed in future.  Return in August for follow-up.

## 2020-10-15 ENCOUNTER — Telehealth: Payer: Self-pay

## 2020-10-15 NOTE — Telephone Encounter (Signed)
-----   Message from Venita Lick, NP sent at 10/12/2020  2:28 PM EDT ----- Around August 17th follow-up needed

## 2020-10-15 NOTE — Telephone Encounter (Signed)
lvm to make this apt.  

## 2020-10-16 NOTE — Telephone Encounter (Signed)
Pt scheduled verbalized

## 2020-11-14 ENCOUNTER — Telehealth (INDEPENDENT_AMBULATORY_CARE_PROVIDER_SITE_OTHER): Payer: Commercial Managed Care - PPO | Admitting: Nurse Practitioner

## 2020-11-14 ENCOUNTER — Encounter: Payer: Self-pay | Admitting: Nurse Practitioner

## 2020-11-14 DIAGNOSIS — J029 Acute pharyngitis, unspecified: Secondary | ICD-10-CM

## 2020-11-14 MED ORDER — LIDOCAINE VISCOUS HCL 2 % MT SOLN: 15.0000 mL | OROMUCOSAL | 0 refills | Status: DC | PRN

## 2020-11-14 MED ORDER — AMOXICILLIN-POT CLAVULANATE 875-125 MG PO TABS: 1.0000 | ORAL_TABLET | Freq: Two times a day (BID) | ORAL | 0 refills | Status: AC

## 2020-11-14 NOTE — Assessment & Plan Note (Signed)
Acute since 11/11/20 -- home test negative for Covid, will perform PCR testing here and strep outpatient.  At this time ?otitis media vs strep.  Moderate irritation noted on virtual exam of throat.  Will send in Augmentin and viscous Lidocaine for treatment.  Recommend salt water gargles at home and ensure plenty of rest and fluids.  Return to office for worsening or ongoing.

## 2020-11-14 NOTE — Patient Instructions (Signed)

## 2020-11-14 NOTE — Addendum Note (Signed)
Addended by: Rudie Meyer on: 11/14/2020 04:44 PM   Modules accepted: Orders

## 2020-11-14 NOTE — Progress Notes (Signed)
Temp 98.1 F (36.7 C) (Oral)   LMP 04/27/2020    Subjective:    Patient ID: Jessica Cross, female    DOB: 1991/03/19, 30 y.o.   MRN: [id number]  HPI: Jessica Cross is a 30 y.o. female  Chief Complaint  Patient presents with  . Sore Throat  . Headache  . Generalized Body Aches    Patient states she thinks her symptoms started on Sunday, but states her symptoms became worse on Monday. Patient states she took an [facility] [place] test and it was negative. Patient states she has tried Mucinex DM and Ibuprofen 800 mg. Patient states the Ibuprofen has helped a little bit.   . Chills    . This visit was completed via video visit through MyChart due to the restrictions of the COVID-19 pandemic. All issues as above were discussed and addressed. Physical exam was done as above through visual confirmation on video through MyChart. If it was felt that the patient should be evaluated in the office, they were directed there. The patient verbally consented to this visit. . Location of the patient: home . Location of the provider: work . Those involved with this call:  . Provider: Marnee Guarneri, DNP . CMA: Irena Reichmann, CMA . Front Desk/Registration: Jill Side  . Time spent on call: 21 minutes with patient face to face via video conference. More than 50% of this time was spent in counseling and coordination of care. 15 minutes total spent in review of patient's record and preparation of their chart.  . I verified patient identity using two factors (patient name and date of birth). Patient consents verbally to being seen via telemedicine visit today.    UPPER RESPIRATORY TRACT INFECTION Symptoms started Sunday, 11/11/20 with throat hurting, then Monday she started to have worsening throat soreness.  When on trip last week and pretty much everybody returned unwell with same symptoms.  Did at home Covid test which was negative.  Has had Covid vaccines x 2.   Fever: no Cough: a little Shortness  of breath: no Wheezing: no Chest pain: no Chest tightness: no Chest congestion: no Nasal congestion: no Runny nose: no Post nasal drip: no Sneezing: no Sore throat: yes Swollen glands: yes Sinus pressure: no Headache: yes Face pain: no Toothache: no Ear pain: yes "right Ear pressure: yes "right Eyes red/itching:no Eye drainage/crusting: no  Vomiting: no Rash: no Fatigue: yes Sick contacts: yes Strep contacts: no  Context: fluctuating Recurrent sinusitis: no Relief with OTC cold/cough medications: yes  Treatments attempted: Tylenol   Relevant past medical, surgical, family and social history reviewed and updated as indicated. Interim medical history since our last visit reviewed. Allergies and medications reviewed and updated.  Review of Systems  Constitutional: Positive for chills and fatigue. Negative for activity change, appetite change and fever.  HENT: Positive for ear pain, postnasal drip and sore throat. Negative for congestion, ear discharge, facial swelling, rhinorrhea, sinus pressure, sinus pain, sneezing and voice change.   Eyes: Negative for pain and visual disturbance.  Respiratory: Negative for cough, chest tightness, shortness of breath and wheezing.   Cardiovascular: Negative for chest pain, palpitations and leg swelling.  Gastrointestinal: Negative.   Endocrine: Negative.   Musculoskeletal: Positive for myalgias.  Neurological: Positive for headaches. Negative for dizziness and numbness.  Psychiatric/Behavioral: Negative.     Per HPI unless specifically indicated above     Objective:    Temp 98.1 F (36.7 C) (Oral)   LMP 04/27/2020  Wt Readings from Last 3 Encounters:  08/17/20 156 lb 3.2 oz (70.9 kg)  07/20/20 156 lb 12.8 oz (71.1 kg)  05/14/20 150 lb (68 kg)    Physical Exam Vitals and nursing note reviewed.  Constitutional:      General: She is awake. She is not in acute distress.    Appearance: She is well-developed and well-groomed.  She is not ill-appearing or toxic-appearing.  HENT:     Head: Normocephalic.     Right Ear: Hearing normal.     Left Ear: Hearing normal.     Mouth/Throat:     Pharynx: Pharyngeal swelling (mild) and posterior oropharyngeal erythema (moderate noted) present. No oropharyngeal exudate.     Tonsils: No tonsillar exudate. 2+ on the right. 2+ on the left.  Eyes:     General: Lids are normal.        Right eye: No discharge.        Left eye: No discharge.     Conjunctiva/sclera: Conjunctivae normal.  Pulmonary:     Effort: Pulmonary effort is normal. No accessory muscle usage or respiratory distress.  Musculoskeletal:     Cervical back: Normal range of motion.  Neurological:     Mental Status: She is alert and oriented to person, place, and time.  Psychiatric:        Attention and Perception: Attention normal.        Mood and Affect: Mood normal.        Behavior: Behavior normal. Behavior is cooperative.        Thought Content: Thought content normal.        Judgment: Judgment normal.    Results for orders placed or performed in visit on 07/20/20  CBC with Differential/Platelet  Result Value Ref Range   WBC 4.8 3.4 - 10.8 x10E3/uL   RBC 4.74 3.77 - 5.28 x10E6/uL   Hemoglobin 14.0 11.1 - 15.9 g/dL   Hematocrit 42.5 34.0 - 46.6 %   MCV 90 79 - 97 fL   MCH 29.5 26.6 - 33.0 pg   MCHC 32.9 31.5 - 35.7 g/dL   RDW 11.8 11.7 - 15.4 %   Platelets 255 150 - 450 x10E3/uL   Neutrophils 56 Not Estab. %   Lymphs 35 Not Estab. %   Monocytes 7 Not Estab. %   Eos 2 Not Estab. %   Basos 0 Not Estab. %   Neutrophils Absolute 2.7 1.4 - 7.0 x10E3/uL   Lymphocytes Absolute 1.7 0.7 - 3.1 x10E3/uL   Monocytes Absolute 0.3 0.1 - 0.9 x10E3/uL   EOS (ABSOLUTE) 0.1 0.0 - 0.4 x10E3/uL   Basophils Absolute 0.0 0.0 - 0.2 x10E3/uL   Immature Granulocytes 0 Not Estab. %   Immature Grans (Abs) 0.0 0.0 - 0.1 x10E3/uL  Comprehensive metabolic panel  Result Value Ref Range   Glucose 77 65 - 99 mg/dL   BUN  16 6 - 20 mg/dL   Creatinine, Ser 0.74 0.57 - 1.00 mg/dL   GFR calc non Af Amer 108 >59 mL/min/1.73   GFR calc Af Amer 125 >59 mL/min/1.73   BUN/Creatinine Ratio 22 9 - 23   Sodium 138 134 - 144 mmol/L   Potassium 4.5 3.5 - 5.2 mmol/L   Chloride 100 96 - 106 mmol/L   CO2 21 20 - 29 mmol/L   Calcium 10.0 8.7 - 10.2 mg/dL   Total Protein 7.2 6.0 - 8.5 g/dL   Albumin 4.9 (H) 3.8 - 4.8 g/dL   Globulin, Total 2.3 1.5 -  4.5 g/dL   Albumin/Globulin Ratio 2.1 1.2 - 2.2   Bilirubin Total 0.3 0.0 - 1.2 mg/dL   Alkaline Phosphatase 59 44 - 121 IU/L   AST 22 0 - 40 IU/L   ALT 15 0 - 32 IU/L  TSH  Result Value Ref Range   TSH 1.410 0.450 - 4.500 uIU/mL  Magnesium  Result Value Ref Range   Magnesium 2.2 1.6 - 2.3 mg/dL      Assessment & Plan:   Problem List Items Addressed This Visit      Other   Sore throat    Acute since 11/11/20 -- home test negative for Covid, will perform PCR testing here and strep outpatient.  At this time ?otitis media vs strep.  Moderate irritation noted on virtual exam of throat.  Will send in Augmentin and viscous Lidocaine for treatment.  Recommend salt water gargles at home and ensure plenty of rest and fluids.  Return to office for worsening or ongoing.      Relevant Orders   Rapid Strep Screen ([facility] ONLY)   Novel Coronavirus, NAA ([facility])      I discussed the assessment and treatment plan with the patient. The patient was provided an opportunity to ask questions and all were answered. The patient agreed with the plan and demonstrated an understanding of the instructions.   The patient was advised to call back or seek an in-person evaluation if the symptoms worsen or if the condition fails to improve as anticipated.   I provided 21+ minutes of time during this encounter.  Follow up plan: Return if symptoms worsen or fail to improve.

## 2020-11-15 ENCOUNTER — Telehealth: Payer: Self-pay | Admitting: Nurse Practitioner

## 2020-11-15 LAB — SARS-COV-2, NAA 2 DAY TAT

## 2020-11-15 LAB — NOVEL CORONAVIRUS, NAA: SARS-CoV-2, NAA: NOT DETECTED

## 2020-11-15 NOTE — Progress Notes (Signed)
Good morning!!  Please let Jessica Cross know her strep testing is negative.  Covid testing is not returned yet.  Have a great day!!

## 2020-11-15 NOTE — Progress Notes (Signed)
Contacted via [place] -- no Covid noted.  Continue current treatment regimen.  If any questions let me know:)

## 2020-11-15 NOTE — Telephone Encounter (Signed)
Patient notified of result, see result note.

## 2020-11-15 NOTE — Telephone Encounter (Signed)
patient called in for results of strep test

## 2020-11-17 LAB — RAPID STREP SCREEN (MED CTR MEBANE ONLY): Strep Gp A Ag, IA W/Reflex: NEGATIVE

## 2020-11-17 LAB — CULTURE, GROUP A STREP: Strep A Culture: NEGATIVE

## 2020-12-03 ENCOUNTER — Other Ambulatory Visit: Payer: Self-pay | Admitting: Nurse Practitioner

## 2020-12-03 MED ORDER — PREDNISONE 20 MG PO TABS: 40.0000 mg | ORAL_TABLET | Freq: Every day | ORAL | 0 refills | Status: AC

## 2020-12-07 ENCOUNTER — Ambulatory Visit: Payer: Commercial Managed Care - PPO | Admitting: Nurse Practitioner

## 2021-02-15 ENCOUNTER — Ambulatory Visit: Payer: Commercial Managed Care - PPO | Admitting: Nurse Practitioner

## 2021-03-20 ENCOUNTER — Other Ambulatory Visit: Payer: Self-pay | Admitting: Nurse Practitioner

## 2021-03-29 ENCOUNTER — Ambulatory Visit (INDEPENDENT_AMBULATORY_CARE_PROVIDER_SITE_OTHER): Payer: No Typology Code available for payment source | Admitting: Nurse Practitioner

## 2021-03-29 ENCOUNTER — Encounter: Payer: Self-pay | Admitting: Nurse Practitioner

## 2021-03-29 ENCOUNTER — Other Ambulatory Visit: Payer: Self-pay

## 2021-03-29 VITALS — BP 95/69 | HR 88 | Temp 99.1°F | Wt 151.6 lb

## 2021-03-29 DIAGNOSIS — R41840 Attention and concentration deficit: Secondary | ICD-10-CM | POA: Diagnosis not present

## 2021-03-29 DIAGNOSIS — F419 Anxiety disorder, unspecified: Secondary | ICD-10-CM | POA: Diagnosis not present

## 2021-03-29 DIAGNOSIS — I491 Atrial premature depolarization: Secondary | ICD-10-CM | POA: Diagnosis not present

## 2021-03-29 DIAGNOSIS — F331 Major depressive disorder, recurrent, moderate: Secondary | ICD-10-CM | POA: Diagnosis not present

## 2021-03-29 NOTE — Progress Notes (Signed)
BP 95/69   Pulse 88   Temp 99.1 F (37.3 C) (Oral)   Wt 151 lb 9.6 oz (68.8 kg)   LMP 04/27/2020   SpO2 97%   BMI 28.92 kg/m    Subjective:    Patient ID: Jessica Cross, female    DOB: Sep 21, 1990, 30 y.o.   MRN: [id number]  HPI: Jessica Cross is a 30 y.o. female  Chief Complaint  Patient presents with   Mood    Patient states she is here to follow up for Mood. Patient states that everything is going pretty good. Patient states the medication is working good.    Medication Problem    Patient states since she started the Wellbutrin in March/April that she has noticed her blood pressure has been low. Patient states she would like to discuss with provider as she is now paranoid about her blood pressure. Patient states yesterday she noticed some light headness as well and would like to discuss.    ANXIETY/STRESS/DEPRESSION Continues on Wellbutrin XL 150 MG daily, which she has been tolerating well.  Notices a low BP on occasion, which concerns her.  Goes next month to [facility] for ADD/ADHD testing.  In past has tried -- Celexa which caused severe panic attacks and took Prozac for 2-3 years, developed anxiety on this medication.  Her mother had depression years ago, but not anymore -- during divorce.   Duration: stable Anxious mood: yes , improved with Wellbutrin Excessive worrying: yes Irritability: yes , a lot Sweating: x 1 episode Nausea: x 1 episode Palpitations: in past -- takes Metoprolol for PAC Hyperventilation: no Panic attacks: x 1 episode in July Agoraphobia: no  Obscessions/compulsions: no Depressed mood: yes Depression screen [facility] 2/9 03/29/2021 10/12/2020 08/17/2020 07/20/2020 08/16/2019  Decreased Interest 1 1 1 1 2   Down, Depressed, Hopeless 1 0 1 0 2  PHQ - 2 Score 2 1 2 1 4   Altered sleeping 2 0 0 1 1  Tired, decreased energy 2 3 2 3 2   Change in appetite 0 0 0 1 0  Feeling bad or failure about yourself  1 0 1 0 1  Trouble concentrating 2 0 2 3 1    Moving slowly or fidgety/restless 1 0 0 0 0  Suicidal thoughts 0 0 0 0 0  PHQ-9 Score 10 4 7 9 9   Difficult doing work/chores Somewhat difficult - Somewhat difficult - Somewhat difficult   Anhedonia: no Weight changes: none Insomnia: yes hard to fall asleep  Hypersomnia: yes Fatigue/loss of energy: yes Feelings of worthlessness: no Feelings of guilt: no Impaired concentration/indecisiveness: yes Suicidal ideations: no  Crying spells: no Recent Stressors/Life Changes: no   Relationship problems: no   Family stress: no     Financial stress: no    Job stress: no    Recent death/loss: no GAD 7 : Generalized Anxiety Score 10/12/2020 08/17/2020 07/20/2020 08/16/2019  Nervous, Anxious, on Edge 1 1 3 2   Control/stop worrying 1 1 3 3   Worry too much - different things 1 1 3 3   Trouble relaxing 0 1 1 1   Restless 0 0 0 1  Easily annoyed or irritable 3 1 1 2   Afraid - awful might happen 0 2 3 2   Total GAD 7 Score 6 7 14 14   Anxiety Difficulty - Somewhat difficult - Somewhat difficult   Relevant past medical, surgical, family and social history reviewed and updated as indicated. Interim medical history since our last visit reviewed. Allergies and medications  reviewed and updated.  Review of Systems  Constitutional:  Negative for activity change, appetite change, chills, fatigue and fever.  Respiratory:  Negative for cough, chest tightness and wheezing.   Gastrointestinal: Negative.   Endocrine: Negative for cold intolerance and heat intolerance.  Neurological:  Negative for dizziness, tremors, weakness, light-headedness, numbness and headaches.   Per HPI unless specifically indicated above     Objective:    BP 95/69   Pulse 88   Temp 99.1 F (37.3 C) (Oral)   Wt 151 lb 9.6 oz (68.8 kg)   LMP 04/27/2020   SpO2 97%   BMI 28.92 kg/m   Wt Readings from Last 3 Encounters:  03/29/21 151 lb 9.6 oz (68.8 kg)  08/17/20 156 lb 3.2 oz (70.9 kg)  07/20/20 156 lb 12.8 oz (71.1 kg)     Physical Exam Vitals and nursing note reviewed.  Constitutional:      General: She is awake. She is not in acute distress.    Appearance: She is well-developed and well-groomed. She is not ill-appearing.  HENT:     Head: Normocephalic.     Right Ear: Hearing normal.     Left Ear: Hearing normal.  Eyes:     General: Lids are normal.        Right eye: No discharge.        Left eye: No discharge.     Conjunctiva/sclera: Conjunctivae normal.     Pupils: Pupils are equal, round, and reactive to light.  Neck:     Thyroid: No thyromegaly.     Vascular: No carotid bruit or JVD.  Cardiovascular:     Rate and Rhythm: Normal rate and regular rhythm.     Heart sounds: Normal heart sounds. No murmur heard.   No gallop.  Pulmonary:     Effort: Pulmonary effort is normal. No accessory muscle usage or respiratory distress.     Breath sounds: Normal breath sounds.  Abdominal:     General: Bowel sounds are normal.     Palpations: Abdomen is soft.  Musculoskeletal:     Cervical back: Normal range of motion and neck supple.     Right lower leg: No edema.     Left lower leg: No edema.  Lymphadenopathy:     Cervical: No cervical adenopathy.  Skin:    General: Skin is warm and dry.  Neurological:     Mental Status: She is alert and oriented to person, place, and time.  Psychiatric:        Attention and Perception: Attention normal.        Mood and Affect: Mood normal.        Speech: Speech normal.        Behavior: Behavior normal. Behavior is cooperative.        Thought Content: Thought content normal.    Results for orders placed or performed in visit on 11/14/20  Novel Coronavirus, NAA ([facility])   Specimen: Nasopharyngeal(NP) swabs in vial transport medium  Result Value Ref Range   SARS-CoV-2, NAA Not Detected Not Detected  Rapid Strep Screen ([facility] ONLY)   Specimen: Other   Other  Result Value Ref Range   Strep Gp A Ag, IA W/Reflex Negative Negative  Culture, Group A  Strep   Other  Result Value Ref Range   Strep A Culture Negative   SARS-COV-2, NAA 2 DAY TAT  Result Value Ref Range   SARS-CoV-2, NAA 2 DAY TAT Performed  Assessment & Plan:   Problem List Items Addressed This Visit       Cardiovascular and Mediastinum   PAC (premature atrial contraction)    Currently taking Metoprolol XL 12.5 MG, may need to discontinue this in future if BP too low or increased dizziness.  Suspect in future may not need Metoprolol XL if anxiety improved.  Monitor closely and further reduce as needed.        Other   Depression - Primary    Chronic, ongoing. At this time continue Wellbutrin 150 MG XL daily as is offering benefit.  Refills up to date.  She denies SI/HI.  Scheduled for ADHD testing,  [facility] upcoming.  If poor tolerance Wellbutrin could consider SNRI or possibly trial Seroquel.  May also benefit from therapy on regular basis.  Return in 6 months.      Anxiety    Chronic, ongoing. Refer to depression plan of care for further.  She denies SI/HI.  ?more attention deficit element vs anxiety.  Will also obtain ADHD testing further, scheduled to see [facility].  If poor tolerance Wellbutrin could consider SNRI or possibly trial Seroquel.  May also benefit from therapy on regular basis.  Return in 6 months for physical.      Lack of concentration    Scheduled for ADD/ADHD testing upcoming with [facility].        Follow up plan: Return in about 6 months (around 09/27/2021) for Annual physical.

## 2021-03-29 NOTE — Assessment & Plan Note (Signed)
Chronic, ongoing. At this time continue Wellbutrin 150 MG XL daily as is offering benefit.  Refills up to date.  She denies SI/HI.  Scheduled for ADHD testing,  [facility] upcoming.  If poor tolerance Wellbutrin could consider SNRI or possibly trial Seroquel.  May also benefit from therapy on regular basis.  Return in 6 months.

## 2021-03-29 NOTE — Assessment & Plan Note (Signed)
Currently taking Metoprolol XL 12.5 MG, may need to discontinue this in future if BP too low or increased dizziness.  Suspect in future may not need Metoprolol XL if anxiety improved.  Monitor closely and further reduce as needed.

## 2021-03-29 NOTE — Patient Instructions (Signed)
Living With Attention Deficit Hyperactivity Disorder If you have been diagnosed with attention deficit hyperactivity disorder (ADHD), you may be relieved that you now know why you have felt or behaved a certain way. Still, you may feel overwhelmed about the treatment ahead. You may also wonder how to get the support you need and how to deal with the condition day-to-day. With treatment and support, you can live with ADHD and manage your symptoms. How to manage lifestyle changes Managing stress Stress is your body's reaction to life changes and events, both good and bad. To cope with the stress of an ADHD diagnosis, it may help to: Learn more about ADHD. Exercise regularly. Even a short daily walk can lower stress levels. Participate in training or education programs (including social skills training classes) that teach you to deal with symptoms.  Medicines Your health care provider may suggest certain medicines if he or she feels that they will help to improve your condition. Stimulant medicines are usually prescribed to treat ADHD, and therapy may also be prescribed. It is important to: Avoid using alcohol and other substances that may prevent your medicines from working properly (may interact). Talk with your pharmacist or health care provider about all the medicines that you take, their possible side effects, and what medicines are safe to take together. Make it your goal to take part in all treatment decisions (shared decision-making). Ask about possible side effects of medicines that your health care provider recommends, and tell him or her how you feel about having those side effects. It is best if shared decision-making with your health care provider is part of your total treatment plan. Relationships To strengthen your relationships with family members while treating your condition, consider taking part in family therapy. You might also attend self-help groups alone or with a loved one. Be  honest about how your symptoms affect your relationships. Make an effort to communicate respectfully instead of fighting, and find ways to show others that you care. Psychotherapy may be useful in helping you cope with how ADHD affects your relationships. How to recognize changes in your condition The following signs may mean that your treatment is working well and your condition is improving: Consistently being on time for appointments. Being more organized at home and work. Other people noticing improvements in your behavior. Achieving goals that you set for yourself. Thinking more clearly. The following signs may mean that your treatment is not working very well: Feeling impatience or more confusion. Missing, forgetting, or being late for appointments. An increasing sense of disorganization and messiness. More difficulty in reaching goals that you set for yourself. Loved ones becoming angry or frustrated with you. Follow these instructions at home: Take over-the-counter and prescription medicines only as told by your health care provider. Check with your health care provider before taking any new medicines. Create structure and an organized atmosphere at home. For example: Make a list of tasks, then rank them from most important to least important. Work on one task at a time until your listed tasks are done. Make a daily schedule and follow it consistently every day. Use an appointment calendar, and check it 2 or 3 times a day to keep on track. Keep it with you when you leave the house. Create spaces where you keep certain things, and always put things back in their places after you use them. Keep all follow-up visits as told by your health care provider. This is important. Where to find support Talking to others    Keep emotion out of important discussions and speak in a calm, logical way. Listen closely and patiently to your loved ones. Try to understand their point of view, and try to  avoid getting defensive. Take responsibility for the consequences of your actions. Ask that others do not take your behaviors personally. Aim to solve problems as they come up, and express your feelings instead of bottling them up. Talk openly about what you need from your loved ones and how they can support you. Consider going to family therapy sessions or having your family meet with a specialist who deals with ADHD-related behavior problems. Finances Not all insurance plans cover mental health care, so it is important to check with your insurance carrier. If paying for co-pays or counseling services is a problem, search for a local or county mental health care center. Public mental health care services may be offered there at a low cost or no cost when you are not able to see a private health care provider. If you are taking medicine for ADHD, you may be able to get the generic form, which may be less expensive than brand-name medicine. Some makers of prescription medicines also offer help to patients who cannot afford the medicines that they need. Questions to ask your health care provider: What are the risks and benefits of taking medicines? Would I benefit from therapy? How often should I follow up with a health care provider? Contact a health care provider if: You have side effects from your medicines, such as: Repeated muscle twitches, coughing, or speech outbursts. Sleep problems. Loss of appetite. Depression. New or worsening behavior problems. Dizziness. Unusually fast heartbeat. Stomach pains. Headaches. Get help right away if: You have a severe reaction to a medicine. Your behavior suddenly gets worse. Summary With treatment and support, you can live with ADHD and manage your symptoms. The medicines that are most often prescribed for ADHD are stimulants. Consider taking part in family therapy or self-help groups with family members or friends. When you talk with friends  and family about your ADHD, be patient and communicate openly. Take over-the-counter and prescription medicines only as told by your health care provider. Check with your health care provider before taking any new medicines. This information is not intended to replace advice given to you by your health care provider. Make sure you discuss any questions you have with your health care provider. Document Revised: 11/23/2019 Document Reviewed: 11/23/2019 Elsevier Patient Education  2022 [employer]  

## 2021-03-29 NOTE — Assessment & Plan Note (Signed)
Chronic, ongoing. Refer to depression plan of care for further.  She denies SI/HI.  ?more attention deficit element vs anxiety.  Will also obtain ADHD testing further, scheduled to see [facility].  If poor tolerance Wellbutrin could consider SNRI or possibly trial Seroquel.  May also benefit from therapy on regular basis.  Return in 6 months for physical.

## 2021-03-29 NOTE — Assessment & Plan Note (Signed)
Scheduled for ADD/ADHD testing upcoming with [facility].

## 2021-04-16 ENCOUNTER — Encounter: Payer: Self-pay | Admitting: Nurse Practitioner

## 2021-04-16 ENCOUNTER — Telehealth (INDEPENDENT_AMBULATORY_CARE_PROVIDER_SITE_OTHER): Payer: No Typology Code available for payment source | Admitting: Nurse Practitioner

## 2021-04-16 DIAGNOSIS — J069 Acute upper respiratory infection, unspecified: Secondary | ICD-10-CM

## 2021-04-16 LAB — VERITOR FLU A/B WAIVED
Influenza A: NEGATIVE
Influenza B: NEGATIVE

## 2021-04-16 NOTE — Addendum Note (Signed)
Addended by: Georgina Peer on: 04/16/2021 04:15 PM   Modules accepted: Orders

## 2021-04-16 NOTE — Progress Notes (Signed)
Acute Office Visit  Subjective:    Patient ID: Jessica Cross, female    DOB: 30-Oct-1990, 30 y.o.   MRN: [id number]  Chief Complaint  Patient presents with   Diarrhea    With abdominal cramping, body aches, and chills. She would like to be tested for the flu    HPI Patient is in today for abdominal pain, cramps, and diarrhea yesterday. Then she started getting body aches, chills, sweats.   UPPER RESPIRATORY TRACT INFECTION  Worst symptom: diarrhea Fever: no Cough: no Shortness of breath: no Wheezing: no Chest pain: no Chest tightness: no Chest congestion: no Nasal congestion:  mild Runny nose: yes Post nasal drip: no Sneezing: yes Sore throat: yes Swollen glands: no Sinus pressure: no Headache: yes Face pain: no Toothache: no Ear pain: no  Ear pressure: no  Eyes red/itching:no Eye drainage/crusting: no  Vomiting: no Rash: no Fatigue: yes Sick contacts: no Strep contacts: no  Context: better Recurrent sinusitis: no Relief with OTC cold/cough medications: yes  Treatments attempted:  aleve, theraflu     Past Medical History:  Diagnosis Date   Anemia    H/O    Anxiety    Asthma    as a child    Depression    Migraine    Tobacco use     Past Surgical History:  Procedure Laterality Date   COSMETIC SURGERY     CYSTOSCOPY  05/14/2020   Procedure: CYSTOSCOPY;  Surgeon: Ward, Honor Loh, MD;  Location: [facility];  Service: Gynecology;;   NOSE SURGERY     Due to broken nose as a child.   TOTAL LAPAROSCOPIC HYSTERECTOMY WITH SALPINGECTOMY Bilateral 05/14/2020   Procedure: TOTAL LAPAROSCOPIC HYSTERECTOMY WITH BILATERAL SALPINGECTOMY;  Surgeon: Ward, Honor Loh, MD;  Location: [facility];  Service: Gynecology;  Laterality: Bilateral;    Family History  Problem Relation Age of Onset   Healthy Mother    Healthy Father    Kidney cancer Maternal Aunt    Cancer Paternal Grandfather     Social History   Socioeconomic History   Marital status: Married     Spouse name: Not on file   Number of children: Not on file   Years of education: Not on file   Highest education level: Not on file  Occupational History   Not on file  Tobacco Use   Smoking status: Former    Packs/day: 0.50    Types: Cigarettes    Quit date: 10/21/2016    Years since quitting: 4.4   Smokeless tobacco: Never  Vaping Use   Vaping Use: Some days  Substance and Sexual Activity   Alcohol use: Yes    Alcohol/week: 5.0 - 10.0 standard drinks    Types: 5 - 10 Standard drinks or equivalent per week    Comment: occasionally   Drug use: No   Sexual activity: Yes    Partners: Male  Other Topics Concern   Not on file  Social History Narrative   Not on file   Social Determinants of Health   Financial Resource Strain: Not on file  Food Insecurity: Not on file  Transportation Needs: Not on file  Physical Activity: Not on file  Stress: Not on file  Social Connections: Not on file  Intimate Partner Violence: Not on file    Outpatient Medications Prior to Visit  Medication Sig Dispense Refill   buPROPion (WELLBUTRIN XL) 150 MG 24 hr tablet Take 1 tablet (150 mg total) by mouth daily. 90 tablet  4   metoprolol succinate (TOPROL-XL) 25 MG 24 hr tablet Take 0.5 tablets (12.5 mg total) by mouth daily. 45 tablet 3   No facility-administered medications prior to visit.    Allergies  Allergen Reactions   Bactrim [Sulfamethoxazole-Trimethoprim] Hives    Review of Systems  Constitutional:  Positive for chills, diaphoresis and fatigue. Negative for fever.  HENT:  Positive for congestion, rhinorrhea and sore throat. Negative for ear pain, postnasal drip and sinus pressure.   Eyes: Negative.   Respiratory: Negative.    Cardiovascular: Negative.   Gastrointestinal:  Positive for abdominal pain and diarrhea. Negative for constipation, nausea and vomiting.  Genitourinary: Negative.   Musculoskeletal:  Positive for myalgias.  Skin: Negative.   Neurological:  Positive for  headaches. Negative for dizziness.      Objective:    Physical Exam Vitals and nursing note reviewed.  Constitutional:      General: She is not in acute distress.    Appearance: Normal appearance.  HENT:     Head: Normocephalic.  Eyes:     Conjunctiva/sclera: Conjunctivae normal.  Pulmonary:     Effort: Pulmonary effort is normal.     Comments: Able to talk in complete sentences  Neurological:     Mental Status: She is alert and oriented to person, place, and time.  Psychiatric:        Mood and Affect: Mood normal.        Behavior: Behavior normal.        Thought Content: Thought content normal.        Judgment: Judgment normal.    LMP 04/27/2020  Wt Readings from Last 3 Encounters:  03/29/21 151 lb 9.6 oz (68.8 kg)  08/17/20 156 lb 3.2 oz (70.9 kg)  07/20/20 156 lb 12.8 oz (71.1 kg)    Health Maintenance Due  Topic Date Due   HIV Screening  Never done   Hepatitis C Screening  Never done   COVID-19 Vaccine (3 - Moderna risk series) 03/15/2020   INFLUENZA VACCINE  01/21/2021    There are no preventive care reminders to display for this patient.   Lab Results  Component Value Date   TSH 1.410 07/20/2020   Lab Results  Component Value Date   WBC 4.8 07/20/2020   HGB 14.0 07/20/2020   HCT 42.5 07/20/2020   MCV 90 07/20/2020   PLT 255 07/20/2020   Lab Results  Component Value Date   NA 138 07/20/2020   K 4.5 07/20/2020   CO2 21 07/20/2020   GLUCOSE 77 07/20/2020   BUN 16 07/20/2020   CREATININE 0.74 07/20/2020   BILITOT 0.3 07/20/2020   ALKPHOS 59 07/20/2020   AST 22 07/20/2020   ALT 15 07/20/2020   PROT 7.2 07/20/2020   ALBUMIN 4.9 (H) 07/20/2020   CALCIUM 10.0 07/20/2020   ANIONGAP 10 05/10/2020   No results found for: CHOL No results found for: HDL No results found for: LDLCALC No results found for: TRIG No results found for: CHOLHDL No results found for: HGBA1C     Assessment & Plan:   Problem List Items Addressed This Visit    None Visit Diagnoses     Upper respiratory tract infection, unspecified type    -  Primary   Will test for flu and covid-19. Encouraged fluids including water and gatorade and rest. Can continue aleve and theraflu. Follow-up with worsening or symptoms.   Relevant Orders   Influenza A & B (STAT)   Novel Coronavirus, NAA ([facility])  No orders of the defined types were placed in this encounter.   This visit was completed via MyChart due to the restrictions of the COVID-19 pandemic. All issues as above were discussed and addressed. Physical exam was done as above through visual confirmation on MyChart. If it was felt that the patient should be evaluated in the office, they were directed there. The patient verbally consented to this visit. Location of the patient: home Location of the provider: work Those involved with this call:  Provider: Vance Peper, NP CMA:  n/a Front Desk/Registration: Myrlene Broker  Time spent on call:  10 minutes with patient face to face via video conference. More than 50% of this time was spent in counseling and coordination of care. 10 minutes total spent in review of patient's record and preparation of their chart.   Charyl Dancer, NP

## 2021-04-17 LAB — SARS-COV-2, NAA 2 DAY TAT

## 2021-04-17 LAB — NOVEL CORONAVIRUS, NAA: SARS-CoV-2, NAA: NOT DETECTED

## 2021-04-18 ENCOUNTER — Telehealth: Payer: No Typology Code available for payment source | Admitting: Nurse Practitioner

## 2021-04-19 ENCOUNTER — Ambulatory Visit: Payer: Self-pay | Admitting: Nurse Practitioner

## 2021-04-21 ENCOUNTER — Other Ambulatory Visit: Payer: Self-pay | Admitting: Nurse Practitioner

## 2021-04-21 NOTE — Telephone Encounter (Signed)
last RF 08/17/20 #90 4 RF

## 2021-05-04 ENCOUNTER — Encounter: Payer: Self-pay | Admitting: Nurse Practitioner

## 2021-05-07 ENCOUNTER — Other Ambulatory Visit: Payer: Self-pay

## 2021-05-07 MED ORDER — VYVANSE 20 MG PO CAPS
ORAL_CAPSULE | ORAL | 0 refills | Status: DC
Start: 2021-05-07 — End: 2021-07-12
  Filled 2021-05-07: qty 30, 30d supply, fill #0

## 2021-05-10 ENCOUNTER — Other Ambulatory Visit: Payer: Self-pay

## 2021-05-10 ENCOUNTER — Encounter: Payer: Self-pay | Admitting: Nurse Practitioner

## 2021-05-10 ENCOUNTER — Ambulatory Visit (INDEPENDENT_AMBULATORY_CARE_PROVIDER_SITE_OTHER): Payer: No Typology Code available for payment source | Admitting: Nurse Practitioner

## 2021-05-10 ENCOUNTER — Ambulatory Visit: Payer: No Typology Code available for payment source | Admitting: Nurse Practitioner

## 2021-05-10 VITALS — BP 97/63 | HR 55 | Ht 60.0 in | Wt 154.6 lb

## 2021-05-10 DIAGNOSIS — I491 Atrial premature depolarization: Secondary | ICD-10-CM | POA: Diagnosis not present

## 2021-05-10 DIAGNOSIS — Z114 Encounter for screening for human immunodeficiency virus [HIV]: Secondary | ICD-10-CM

## 2021-05-10 DIAGNOSIS — Z136 Encounter for screening for cardiovascular disorders: Secondary | ICD-10-CM

## 2021-05-10 DIAGNOSIS — Z13220 Encounter for screening for lipoid disorders: Secondary | ICD-10-CM

## 2021-05-10 DIAGNOSIS — F902 Attention-deficit hyperactivity disorder, combined type: Secondary | ICD-10-CM | POA: Diagnosis not present

## 2021-05-10 DIAGNOSIS — F331 Major depressive disorder, recurrent, moderate: Secondary | ICD-10-CM

## 2021-05-10 DIAGNOSIS — Z0000 Encounter for general adult medical examination without abnormal findings: Secondary | ICD-10-CM | POA: Diagnosis not present

## 2021-05-10 DIAGNOSIS — F419 Anxiety disorder, unspecified: Secondary | ICD-10-CM

## 2021-05-10 DIAGNOSIS — Z1159 Encounter for screening for other viral diseases: Secondary | ICD-10-CM | POA: Diagnosis not present

## 2021-05-10 DIAGNOSIS — D649 Anemia, unspecified: Secondary | ICD-10-CM

## 2021-05-10 NOTE — Assessment & Plan Note (Addendum)
Chronic, ongoing. At this time continue Wellbutrin 150 MG XL daily as is offering benefit, however made her aware of possibility for interactions with Vyvanse and her Metoprolol -- if these present I recommend stopping Wellbutrin and switching to an alternative such as SSRI or SNRI.  Refills up to date.  She denies SI/HI.  May also benefit from therapy on regular basis.  Return in 6 months.

## 2021-05-10 NOTE — Assessment & Plan Note (Signed)
Diagnosed at [facility] who have prescribed her Vyvanse. At this time will also continue Wellbutrin 150 MG XL daily as is offering benefit and she is fearful of stopping due to this, however made her aware of possibility for interactions with Vyvanse and her Metoprolol while on Wellbutrin.  She is aware to stop Wellbutrin or notify provider if any issues.  Return in 6 months, may take over Vyvanse in future.

## 2021-05-10 NOTE — Assessment & Plan Note (Signed)
Currently taking Metoprolol XL 12.5 MG, may need to discontinue this in future if BP too low or increased dizziness, especially while on Wellbutrin, which made her aware of.  Suspect in future may not need Metoprolol XL if anxiety improved.  Monitor closely and further reduce as needed.

## 2021-05-10 NOTE — Patient Instructions (Signed)

## 2021-05-10 NOTE — Progress Notes (Signed)
BP 97/63   Pulse (!) 55   Ht 5' (1.524 m)   Wt 154 lb 9.6 oz (70.1 kg)   LMP 04/27/2020   SpO2 98%   BMI 30.19 kg/m    Subjective:    Patient ID: Jessica Cross, female    DOB: November 24, 1990, 30 y.o.   MRN: [id number]  HPI: Jessica Cross is a 30 y.o. female presenting on 05/10/2021 for comprehensive medical examination. Current medical complaints include:none  She currently lives with: husband -- one child/girl 26 years old Menopausal Symptoms: no  ANXIETY/STRESS/DEPRESSION Continues on Wellbutrin XL 150 MG daily, which she has been tolerating well.  Vyvanse has been ordered by [facility] for new diagnosis ADHD, but she has not started yet, wanted to discuss possible interactions with provider.   In past has tried -- Celexa which caused severe panic attacks and took Prozac for 2-3 years, developed anxiety on this medication.  Her mother had depression years ago.  Duration: stable Anxious mood: yes a little bit Excessive worrying: yes Irritability: yes a little Sweating: none Nausea: none Palpitations: in past -- takes Metoprolol for PAC Hyperventilation: no Panic attacks: none Agoraphobia: no  Obscessions/compulsions: no Depressed mood: none Depression screen [facility] 2/9 05/10/2021 03/29/2021 10/12/2020 08/17/2020 07/20/2020  Decreased Interest 1 1 1 1 1   Down, Depressed, Hopeless 0 1 0 1 0  PHQ - 2 Score 1 2 1 2 1   Altered sleeping 1 2 0 0 1  Tired, decreased energy 2 2 3 2 3   Change in appetite 1 0 0 0 1  Feeling bad or failure about yourself  0 1 0 1 0  Trouble concentrating 2 2 0 2 3  Moving slowly or fidgety/restless 0 1 0 0 0  Suicidal thoughts 0 0 0 0 0  PHQ-9 Score 7 10 4 7 9   Difficult doing work/chores Not difficult at all Somewhat difficult - Somewhat difficult -   Anhedonia: no Weight changes: none Insomnia: yes hard to fall asleep  Hypersomnia: yes Fatigue/loss of energy: yes Feelings of worthlessness: no Feelings of guilt: no Impaired  concentration/indecisiveness: yes Suicidal ideations: no  Crying spells: no Recent Stressors/Life Changes: no   Relationship problems: no   Family stress: no     Financial stress: no    Job stress: no    Recent death/loss: no GAD 7 : Generalized Anxiety Score 05/10/2021 10/12/2020 08/17/2020 07/20/2020  Nervous, Anxious, on Edge 1 1 1 3   Control/stop worrying 2 1 1 3   Worry too much - different things 2 1 1 3   Trouble relaxing 1 0 1 1  Restless 1 0 0 0  Easily annoyed or irritable 1 3 1 1   Afraid - awful might happen 2 0 2 3  Total GAD 7 Score 10 6 7 14   Anxiety Difficulty Not difficult at all - Somewhat difficult -   Fall Risk 10/17/2015 09/15/2018 07/20/2020 05/10/2021  Falls in the past year? Yes 0 1 0  Was there an injury with Fall? Yes - - 0  Fall Risk Category Calculator - - - 0  Fall Risk Category - - - Low  Patient Fall Risk Level - Low fall risk - Low fall risk  Patient at Risk for Falls Due to - - - No Fall Risks  Fall risk Follow up - Falls evaluation completed - Falls evaluation completed    Functional Status Survey: Is the patient deaf or have difficulty hearing?: No Does the patient have difficulty seeing,  even when wearing glasses/contacts?: No Does the patient have difficulty concentrating, remembering, or making decisions?: No Does the patient have difficulty walking or climbing stairs?: No Does the patient have difficulty dressing or bathing?: No Does the patient have difficulty doing errands alone such as visiting a doctor's office or shopping?: No   Past Medical History:  Past Medical History:  Diagnosis Date   Anemia    H/O    Anxiety    Asthma    as a child    Depression    Migraine    Tobacco use     Surgical History:  Past Surgical History:  Procedure Laterality Date   COSMETIC SURGERY     CYSTOSCOPY  05/14/2020   Procedure: CYSTOSCOPY;  Surgeon: Ward, Honor Loh, MD;  Location: [facility];  Service: Gynecology;;   NOSE SURGERY     Due to broken  nose as a child.   TOTAL LAPAROSCOPIC HYSTERECTOMY WITH SALPINGECTOMY Bilateral 05/14/2020   Procedure: TOTAL LAPAROSCOPIC HYSTERECTOMY WITH BILATERAL SALPINGECTOMY;  Surgeon: Ward, Honor Loh, MD;  Location: [facility];  Service: Gynecology;  Laterality: Bilateral;    Medications:  Current Outpatient Medications on File Prior to Visit  Medication Sig   buPROPion (WELLBUTRIN XL) 150 MG 24 hr tablet Take 1 tablet (150 mg total) by mouth daily.   lisdexamfetamine (VYVANSE) 20 MG capsule Take 1 capsule by mouth daily   metoprolol succinate (TOPROL-XL) 25 MG 24 hr tablet Take 0.5 tablets (12.5 mg total) by mouth daily.   No current facility-administered medications on file prior to visit.    Allergies:  Allergies  Allergen Reactions   Bactrim [Sulfamethoxazole-Trimethoprim] Hives    Social History:  Social History   Socioeconomic History   Marital status: Married    Spouse name: Not on file   Number of children: Not on file   Years of education: Not on file   Highest education level: Not on file  Occupational History   Not on file  Tobacco Use   Smoking status: Former    Packs/day: 0.50    Types: Cigarettes    Quit date: 10/21/2016    Years since quitting: 4.5   Smokeless tobacco: Never  Vaping Use   Vaping Use: Some days  Substance and Sexual Activity   Alcohol use: Yes    Alcohol/week: 5.0 - 10.0 standard drinks    Types: 5 - 10 Standard drinks or equivalent per week    Comment: occasionally   Drug use: No   Sexual activity: Yes    Partners: Male  Other Topics Concern   Not on file  Social History Narrative   Not on file   Social Determinants of Health   Financial Resource Strain: Not on file  Food Insecurity: Not on file  Transportation Needs: Not on file  Physical Activity: Not on file  Stress: Not on file  Social Connections: Not on file  Intimate Partner Violence: Not on file   Social History   Tobacco Use  Smoking Status Former   Packs/day: 0.50    Types: Cigarettes   Quit date: 10/21/2016   Years since quitting: 4.5  Smokeless Tobacco Never   Social History   Substance and Sexual Activity  Alcohol Use Yes   Alcohol/week: 5.0 - 10.0 standard drinks   Types: 5 - 10 Standard drinks or equivalent per week   Comment: occasionally    Family History:  Family History  Problem Relation Age of Onset   Healthy Mother    Healthy  Father    Kidney cancer Maternal Aunt    Cancer Paternal Grandfather     Past medical history, surgical history, medications, allergies, family history and social history reviewed with patient today and changes made to appropriate areas of the chart.   ROS All other ROS negative except what is listed above and in the HPI.      Objective:    BP 97/63   Pulse (!) 55   Ht 5' (1.524 m)   Wt 154 lb 9.6 oz (70.1 kg)   LMP 04/27/2020   SpO2 98%   BMI 30.19 kg/m   Wt Readings from Last 3 Encounters:  05/10/21 154 lb 9.6 oz (70.1 kg)  03/29/21 151 lb 9.6 oz (68.8 kg)  08/17/20 156 lb 3.2 oz (70.9 kg)    Physical Exam Vitals and nursing note reviewed.  Constitutional:      General: She is awake. She is not in acute distress.    Appearance: She is well-developed. She is not ill-appearing.  HENT:     Head: Normocephalic and atraumatic.     Right Ear: Hearing, tympanic membrane, ear canal and external ear normal. No drainage.     Left Ear: Hearing, tympanic membrane, ear canal and external ear normal. No drainage.     Nose: Nose normal.     Right Sinus: No maxillary sinus tenderness or frontal sinus tenderness.     Left Sinus: No maxillary sinus tenderness or frontal sinus tenderness.     Mouth/Throat:     Mouth: Mucous membranes are moist.     Pharynx: Oropharynx is clear. Uvula midline. No pharyngeal swelling, oropharyngeal exudate or posterior oropharyngeal erythema.  Eyes:     General: Lids are normal.        Right eye: No discharge.        Left eye: No discharge.     Extraocular Movements:  Extraocular movements intact.     Conjunctiva/sclera: Conjunctivae normal.     Pupils: Pupils are equal, round, and reactive to light.     Visual Fields: Right eye visual fields normal and left eye visual fields normal.  Neck:     Thyroid: No thyromegaly.     Vascular: No carotid bruit.     Trachea: Trachea normal.  Cardiovascular:     Rate and Rhythm: Normal rate and regular rhythm.     Heart sounds: Normal heart sounds. No murmur heard.   No gallop.  Pulmonary:     Effort: Pulmonary effort is normal. No accessory muscle usage or respiratory distress.     Breath sounds: Normal breath sounds.  Abdominal:     General: Bowel sounds are normal.     Palpations: Abdomen is soft. There is no hepatomegaly or splenomegaly.     Tenderness: There is no abdominal tenderness.  Musculoskeletal:        General: Normal range of motion.     Cervical back: Normal range of motion and neck supple.     Right lower leg: No edema.     Left lower leg: No edema.  Lymphadenopathy:     Head:     Right side of head: No submental, submandibular, tonsillar, preauricular or posterior auricular adenopathy.     Left side of head: No submental, submandibular, tonsillar, preauricular or posterior auricular adenopathy.     Cervical: No cervical adenopathy.  Skin:    General: Skin is warm and dry.     Capillary Refill: Capillary refill takes less than 2 seconds.  Findings: No rash.  Neurological:     Mental Status: She is alert and oriented to person, place, and time.     Gait: Gait is intact.     Deep Tendon Reflexes: Reflexes are normal and symmetric.     Reflex Scores:      Brachioradialis reflexes are 2+ on the right side and 2+ on the left side.      Patellar reflexes are 2+ on the right side and 2+ on the left side. Psychiatric:        Attention and Perception: Attention normal.        Mood and Affect: Mood normal.        Speech: Speech normal.        Behavior: Behavior normal. Behavior is  cooperative.        Thought Content: Thought content normal.        Judgment: Judgment normal.    Results for orders placed or performed in visit on 04/16/21  Novel Coronavirus, NAA ([facility])   Specimen: Nasopharyngeal(NP) swabs in vial transport medium  Result Value Ref Range   SARS-CoV-2, NAA Not Detected Not Detected  SARS-COV-2, NAA 2 DAY TAT  Result Value Ref Range   SARS-CoV-2, NAA 2 DAY TAT Performed   Influenza A & B (STAT)  Result Value Ref Range   Influenza A Negative Negative   Influenza B Negative Negative      Assessment & Plan:   Problem List Items Addressed This Visit       Cardiovascular and Mediastinum   PAC (premature atrial contraction)    Currently taking Metoprolol XL 12.5 MG, may need to discontinue this in future if BP too low or increased dizziness, especially while on Wellbutrin, which made her aware of.  Suspect in future may not need Metoprolol XL if anxiety improved.  Monitor closely and further reduce as needed.        Other   ADHD (attention deficit hyperactivity disorder)    Diagnosed at [facility] who have prescribed her Vyvanse. At this time will also continue Wellbutrin 150 MG XL daily as is offering benefit and she is fearful of stopping due to this, however made her aware of possibility for interactions with Vyvanse and her Metoprolol while on Wellbutrin.  She is aware to stop Wellbutrin or notify provider if any issues.  Return in 6 months, may take over Vyvanse in future.      Anemia    Check CBC, iron/ferritin, and B12 today.  Start supplements as needed.      Relevant Orders   CBC with Differential/Platelet   Iron, TIBC and Ferritin Panel   Vitamin B12   Anxiety    Refer to depression plan of care.      Depression - Primary    Chronic, ongoing. At this time continue Wellbutrin 150 MG XL daily as is offering benefit, however made her aware of possibility for interactions with Vyvanse and her Metoprolol -- if  these present I recommend stopping Wellbutrin and switching to an alternative such as SSRI or SNRI.  Refills up to date.  She denies SI/HI.  May also benefit from therapy on regular basis.  Return in 6 months.      Other Visit Diagnoses     Encounter for lipid screening for cardiovascular disease       Lipid panel today   Relevant Orders   Lipid Panel w/o Chol/HDL Ratio   Need for hepatitis C screening test  Hep C screening on labs today per guidelines, discussed with patient.   Relevant Orders   Hepatitis C antibody   Encounter for screening for HIV       HIV screening on labs today per guidelines, discussed with patient.   Relevant Orders   HIV Antibody (routine testing w rflx)   Encounter for annual physical exam       Annual physical today with labs obtained.  Health maintenance reviewed.   Relevant Orders   Comprehensive metabolic panel   TSH        Follow up plan: Return in about 6 months (around 11/07/2021) for DEPRESSION and ADHD.   LABORATORY TESTING:  - Pap smear: not applicable -- hysterectomy 2021  IMMUNIZATIONS:   - Tdap: Tetanus vaccination status reviewed: last tetanus booster within 10 years. - Influenza: Up to date - Pneumovax: Not applicable - Prevnar: Not applicable - COVID: Up to date - HPV: Not applicable - Shingrix vaccine: Not applicable  SCREENING: -Mammogram: Not applicable  - Colonoscopy: Not applicable  - Bone Density: Not applicable  -Hearing Test: Not applicable  -Spirometry: Not applicable   PATIENT COUNSELING:   Advised to take 1 mg of folate supplement per day if capable of pregnancy.   Sexuality: Discussed sexually transmitted diseases, partner selection, use of condoms, avoidance of unintended pregnancy  and contraceptive alternatives.   Advised to avoid cigarette smoking.  I discussed with the patient that most people either abstain from alcohol or drink within safe limits (<=14/week and <=4 drinks/occasion for males,  <=7/weeks and <= 3 drinks/occasion for females) and that the risk for alcohol disorders and other health effects rises proportionally with the number of drinks per week and how often a drinker exceeds daily limits.  Discussed cessation/primary prevention of drug use and availability of treatment for abuse.   Diet: Encouraged to adjust caloric intake to maintain  or achieve ideal body weight, to reduce intake of dietary saturated fat and total fat, to limit sodium intake by avoiding high sodium foods and not adding table salt, and to maintain adequate dietary potassium and calcium preferably from fresh fruits, vegetables, and low-fat dairy products.    Stressed the importance of regular exercise  Injury prevention: Discussed safety belts, safety helmets, smoke detector, smoking near bedding or upholstery.   Dental health: Discussed importance of regular tooth brushing, flossing, and dental visits.    NEXT PREVENTATIVE PHYSICAL DUE IN 1 YEAR. Return in about 6 months (around 11/07/2021) for DEPRESSION and ADHD.

## 2021-05-10 NOTE — Assessment & Plan Note (Signed)
Check CBC, iron/ferritin, and B12 today.  Start supplements as needed.

## 2021-05-10 NOTE — Assessment & Plan Note (Signed)
Refer to depression plan of care. 

## 2021-05-11 ENCOUNTER — Encounter: Payer: Self-pay | Admitting: Nurse Practitioner

## 2021-05-11 LAB — IRON,TIBC AND FERRITIN PANEL
Ferritin: 120 ng/mL (ref 15–150)
Iron Saturation: 22 % (ref 15–55)
Iron: 77 ug/dL (ref 27–159)
Total Iron Binding Capacity: 352 ug/dL (ref 250–450)
UIBC: 275 ug/dL (ref 131–425)

## 2021-05-11 LAB — COMPREHENSIVE METABOLIC PANEL
ALT: 12 IU/L (ref 0–32)
AST: 17 IU/L (ref 0–40)
Albumin/Globulin Ratio: 2.1 (ref 1.2–2.2)
Albumin: 5.1 g/dL — ABNORMAL HIGH (ref 3.8–4.8)
Alkaline Phosphatase: 66 IU/L (ref 44–121)
BUN/Creatinine Ratio: 21 (ref 9–23)
BUN: 15 mg/dL (ref 6–20)
Bilirubin Total: 0.4 mg/dL (ref 0.0–1.2)
CO2: 22 mmol/L (ref 20–29)
Calcium: 9.7 mg/dL (ref 8.7–10.2)
Chloride: 98 mmol/L (ref 96–106)
Creatinine, Ser: 0.7 mg/dL (ref 0.57–1.00)
Globulin, Total: 2.4 g/dL (ref 1.5–4.5)
Glucose: 83 mg/dL (ref 70–99)
Potassium: 4.2 mmol/L (ref 3.5–5.2)
Sodium: 135 mmol/L (ref 134–144)
Total Protein: 7.5 g/dL (ref 6.0–8.5)
eGFR: 118 mL/min/{1.73_m2} (ref >59)

## 2021-05-11 LAB — CBC WITH DIFFERENTIAL/PLATELET
Basophils Absolute: 0 10*3/uL (ref 0.0–0.2)
Basos: 1 %
EOS (ABSOLUTE): 0.1 10*3/uL (ref 0.0–0.4)
Eos: 2 %
Hematocrit: 40.7 % (ref 34.0–46.6)
Hemoglobin: 13.6 g/dL (ref 11.1–15.9)
Immature Grans (Abs): 0 10*3/uL (ref 0.0–0.1)
Immature Granulocytes: 0 %
Lymphocytes Absolute: 2.4 10*3/uL (ref 0.7–3.1)
Lymphs: 37 %
MCH: 29.8 pg (ref 26.6–33.0)
MCHC: 33.4 g/dL (ref 31.5–35.7)
MCV: 89 fL (ref 79–97)
Monocytes Absolute: 0.4 10*3/uL (ref 0.1–0.9)
Monocytes: 7 %
Neutrophils Absolute: 3.4 10*3/uL (ref 1.4–7.0)
Neutrophils: 53 %
Platelets: 262 10*3/uL (ref 150–450)
RBC: 4.56 x10E6/uL (ref 3.77–5.28)
RDW: 11.9 % (ref 11.7–15.4)
WBC: 6.4 10*3/uL (ref 3.4–10.8)

## 2021-05-11 LAB — HEPATITIS C ANTIBODY: Hep C Virus Ab: <0.1 s/co ratio (ref 0.0–0.9)

## 2021-05-11 LAB — LIPID PANEL W/O CHOL/HDL RATIO
Cholesterol, Total: 206 mg/dL — ABNORMAL HIGH (ref 100–199)
HDL: 70 mg/dL (ref >39)
LDL Chol Calc (NIH): 117 mg/dL — ABNORMAL HIGH (ref 0–99)
Triglycerides: 106 mg/dL (ref 0–149)
VLDL Cholesterol Cal: 19 mg/dL (ref 5–40)

## 2021-05-11 LAB — VITAMIN B12: Vitamin B-12: 513 pg/mL (ref 232–1245)

## 2021-05-11 LAB — TSH: TSH: 1.94 u[IU]/mL (ref 0.450–4.500)

## 2021-05-11 LAB — HIV ANTIBODY (ROUTINE TESTING W REFLEX): HIV Screen 4th Generation wRfx: NONREACTIVE

## 2021-05-12 NOTE — Progress Notes (Signed)
Contacted via MyChart   Good morning Jessica Cross, your labs have returned and overall look great, including iron level and B12.  Hep C and HIV are negative.  The only elevations are cholesterol levels.  Your cholesterol is high, but recommendations to make lifestyle changes. Your LDL is above normal. The LDL is the bad cholesterol. Over time and in combination with inflammation and other factors, this contributes to plaque which in turn may lead to stroke and/or heart attack down the road. Sometimes high LDL is primarily genetic, and people might be eating all the right foods but still have high numbers. Other times, there is room for improvement in one's diet and eating healthier can bring this number down and potentially reduce one's risk of heart attack and/or stroke.   To reduce your LDL, Remember - more fruits and vegetables, more fish, and limit red meat and dairy products. More soy, nuts, beans, barley, lentils, oats and plant sterol ester enriched margarine instead of butter. I also encourage eliminating sugar and processed food. Remember, shop on the outside of the grocery store and visit your [employer]. If you would like to talk with me about dietary changes for your cholesterol, please let me know. We should recheck your cholesterol in 12 months.  Any questions? Keep being amazing!!  Thank you for allowing me to participate in your care.  I appreciate you. Kindest regards, Ronen Bromwell

## 2021-05-24 ENCOUNTER — Other Ambulatory Visit: Payer: Self-pay | Admitting: Nurse Practitioner

## 2021-05-25 NOTE — Telephone Encounter (Signed)
Last RF 08/17/20 #90 4 RF Too soon for RF. Requested Prescriptions  Pending Prescriptions Disp Refills   buPROPion (WELLBUTRIN XL) 150 MG 24 hr tablet [Pharmacy Med Name: BUPROPION HCL XL 150 MG TABLET] 90 tablet 5    Sig: TAKE 1 TABLET BY MOUTH EVERY DAY     Psychiatry: Antidepressants - bupropion Passed - 05/24/2021  8:32 AM      Passed - Completed PHQ-2 or PHQ-9 in the last 360 days      Passed - Last BP in normal range    BP Readings from Last 1 Encounters:  05/10/21 97/63          Passed - Valid encounter within last 6 months    Recent Outpatient Visits           2 weeks ago Moderate episode of recurrent major depressive disorder ([place])   [place] Cannady, Jolene T, NP   1 month ago Upper respiratory tract infection, unspecified type   [place], Lauren A, NP   1 month ago Moderate episode of recurrent major depressive disorder ([place])   [place] Cannady, Jolene T, NP   6 months ago Sore throat   [place], Jolene T, NP   7 months ago Moderate episode of recurrent major depressive disorder ([place])   [place], Barbaraann Faster, NP       Future Appointments             In 5 months Cannady, Barbaraann Faster, NP [facility]

## 2021-06-03 ENCOUNTER — Other Ambulatory Visit: Payer: Self-pay

## 2021-06-03 MED FILL — Bupropion HCl Tab ER 24HR 150 MG: ORAL | 90 days supply | Qty: 90 | Fill #0 | Status: AC

## 2021-06-13 ENCOUNTER — Encounter: Payer: Commercial Managed Care - PPO | Admitting: Obstetrics and Gynecology

## 2021-06-14 ENCOUNTER — Encounter: Payer: Self-pay | Admitting: Obstetrics and Gynecology

## 2021-06-14 ENCOUNTER — Other Ambulatory Visit: Payer: Self-pay

## 2021-06-14 ENCOUNTER — Ambulatory Visit (INDEPENDENT_AMBULATORY_CARE_PROVIDER_SITE_OTHER): Payer: No Typology Code available for payment source | Admitting: Obstetrics and Gynecology

## 2021-06-14 VITALS — BP 108/72 | HR 73 | Ht 60.0 in | Wt 151.7 lb

## 2021-06-14 DIAGNOSIS — Z7689 Persons encountering health services in other specified circumstances: Secondary | ICD-10-CM

## 2021-06-14 DIAGNOSIS — R102 Pelvic and perineal pain: Secondary | ICD-10-CM

## 2021-06-14 DIAGNOSIS — Z01419 Encounter for gynecological examination (general) (routine) without abnormal findings: Secondary | ICD-10-CM | POA: Diagnosis not present

## 2021-06-14 DIAGNOSIS — Z124 Encounter for screening for malignant neoplasm of cervix: Secondary | ICD-10-CM | POA: Diagnosis not present

## 2021-06-14 MED ORDER — NORETHINDRONE ACET-ETHINYL EST 1.5-30 MG-MCG PO TABS
1.0000 | ORAL_TABLET | Freq: Every day | ORAL | 3 refills | Status: DC
  Filled 2021-06-14 – 2021-07-04 (×2): qty 84, 84d supply, fill #0

## 2021-06-14 NOTE — Progress Notes (Signed)
GYNECOLOGY ANNUAL PHYSICAL EXAM PROGRESS NOTE  Subjective:    Jessica Cross is a 30 y.o. [id number] female who presents  to establish care, and for an annual exam. Previously seen at [facility]. The patient has no complaints today. The patient is sexually active. The patient participates in regular exercise: not currently. Has the patient ever been transfused or tattooed?: no. The patient reports that there is not domestic violence in her life.    Reports h/o pelvic pain in the past, had a hysterectomy but still noting the pain. Notes that she was then re-evaluated and was told she may have interstitial cystitis. Tried dietary modifications which did not help.  Also still noting some PMS-like symptoms monthly and cramping each month like a menstrual cycle without the bleeding.    Menstrual History: Menarche age: 37 Patient's last menstrual period was 04/27/2020.  Patient has had hysterectomy    Gynecologic History:  Contraception: status post hysterectomy History of STI's: Denies Last Pap: 09/06/2017. Results were: normal.  Denies h/o abnormal pap smears.     OB History  Gravida Para Term Preterm AB Living  1 1 1  0 0 1  SAB IAB Ectopic Multiple Live Births  0 0 0 0 1    # Outcome Date GA Lbr Len/2nd Weight Sex Delivery Anes PTL Lv  1 Term 2008     Vag-Spont   LIV    Past Medical History:  Diagnosis Date   Anemia    H/O    Anxiety    Asthma    as a child    Depression    Migraine    Tobacco use     Past Surgical History:  Procedure Laterality Date   COSMETIC SURGERY     CYSTOSCOPY  05/14/2020   Procedure: CYSTOSCOPY;  Surgeon: Ward, Honor Loh, MD;  Location: [facility] ORS;  Service: Gynecology;;   NOSE SURGERY     Due to broken nose as a child.   TOTAL LAPAROSCOPIC HYSTERECTOMY WITH SALPINGECTOMY Bilateral 05/14/2020   Procedure: TOTAL LAPAROSCOPIC HYSTERECTOMY WITH BILATERAL SALPINGECTOMY;  Surgeon: Ward, Honor Loh, MD;  Location: [facility] ORS;  Service: Gynecology;   Laterality: Bilateral;    Family History  Problem Relation Age of Onset   Healthy Mother    Healthy Father    Kidney cancer Maternal Aunt    Cancer Paternal Grandfather     Social History   Socioeconomic History   Marital status: Married    Spouse name: Not on file   Number of children: Not on file   Years of education: Not on file   Highest education level: Not on file  Occupational History   Not on file  Tobacco Use   Smoking status: Former    Packs/day: 0.50    Types: Cigarettes    Quit date: 10/21/2016    Years since quitting: 4.6   Smokeless tobacco: Never  Vaping Use   Vaping Use: Some days  Substance and Sexual Activity   Alcohol use: Yes    Alcohol/week: 5.0 - 10.0 standard drinks    Types: 5 - 10 Standard drinks or equivalent per week    Comment: occasionally   Drug use: No   Sexual activity: Yes    Partners: Male  Other Topics Concern   Not on file  Social History Narrative   Not on file   Social Determinants of Health   Financial Resource Strain: Not on file  Food Insecurity: Not on file  Transportation Needs: Not  on file  Physical Activity: Not on file  Stress: Not on file  Social Connections: Not on file  Intimate Partner Violence: Not on file    Current Outpatient Medications on File Prior to Visit  Medication Sig Dispense Refill   buPROPion (WELLBUTRIN XL) 150 MG 24 hr tablet TAKE 1 TABLET BY MOUTH EVERY DAY 90 tablet 5   lisdexamfetamine (VYVANSE) 20 MG capsule Take 1 capsule by mouth daily 30 capsule 0   metoprolol succinate (TOPROL-XL) 25 MG 24 hr tablet Take 0.5 tablets (12.5 mg total) by mouth daily. 45 tablet 3   No current facility-administered medications on file prior to visit.    Allergies  Allergen Reactions   Bactrim [Sulfamethoxazole-Trimethoprim] Hives     Review of Systems Constitutional: negative for chills, fatigue, fevers and sweats Eyes: negative for irritation, redness and visual disturbance Ears, nose, mouth,  throat, and face: negative for hearing loss, nasal congestion, snoring and tinnitus Respiratory: negative for asthma, cough, sputum Cardiovascular: negative for chest pain, dyspnea, exertional chest pressure/discomfort, irregular heart beat, palpitations and syncope Gastrointestinal: negative for abdominal pain, change in bowel habits, nausea and vomiting Genitourinary: negative for abnormal menstrual periods, genital lesions, sexual problems and vaginal discharge, dysuria and urinary incontinence. Pelvic pain (see HPI) Integument/breast: negative for breast lump, breast tenderness and nipple discharge Hematologic/lymphatic: negative for bleeding and easy bruising Musculoskeletal:negative for back pain and muscle weakness Neurological: negative for dizziness, headaches, vertigo and weakness Endocrine: negative for diabetic symptoms including polydipsia, polyuria and skin dryness Allergic/Immunologic: negative for hay fever and urticaria      Objective:  Blood pressure 108/72, pulse 73, height 5' (1.524 m), weight 151 lb 11.2 oz (68.8 kg), last menstrual period 04/27/2020.  Body mass index is 29.63 kg/m.   General Appearance:    Alert, cooperative, no distress, appears stated age  Head:    Normocephalic, without obvious abnormality, atraumatic  Eyes:    PERRL, conjunctiva/corneas clear, EOM's intact, both eyes  Ears:    Normal external ear canals, both ears  Nose:   Nares normal, septum midline, mucosa normal, no drainage or sinus tenderness  Throat:   Lips, mucosa, and tongue normal; teeth and gums normal  Neck:   Supple, symmetrical, trachea midline, no adenopathy; thyroid: no enlargement/tenderness/nodules; no carotid bruit or JVD  Back:     Symmetric, no curvature, ROM normal, no CVA tenderness  Lungs:     Clear to auscultation bilaterally, respirations unlabored  Chest Wall:    No tenderness or deformity   Heart:    Regular rate and rhythm, S1 and S2 normal, no murmur, rub or gallop   Breast Exam:    No tenderness, masses, or nipple abnormality  Abdomen:     Soft, non-tender, bowel sounds active all four quadrants, no masses, no organomegaly.    Genitalia:    Pelvic:external genitalia normal, vagina without lesions, discharge, or tenderness, rectovaginal septum  normal. Cervix normal in appearance, no cervical motion tenderness, no adnexal masses or tenderness.  Uterus normal size, shape, mobile, regular contours, nontender.  Rectal:    Normal external sphincter.  No hemorrhoids appreciated. Internal exam not done.   Extremities:   Extremities normal, atraumatic, no cyanosis or edema  Pulses:   2+ and symmetric all extremities  Skin:   Skin color, texture, turgor normal, no rashes or lesions  Lymph nodes:   Cervical, supraclavicular, and axillary nodes normal  Neurologic:   CNII-XII intact, normal strength, sensation and reflexes throughout   .  Labs:  Lab Results  Component Value Date   WBC 6.4 05/10/2021   HGB 13.6 05/10/2021   HCT 40.7 05/10/2021   MCV 89 05/10/2021   PLT 262 05/10/2021    Lab Results  Component Value Date   CREATININE 0.70 05/10/2021   BUN 15 05/10/2021   NA 135 05/10/2021   K 4.2 05/10/2021   CL 98 05/10/2021   CO2 22 05/10/2021    Lab Results  Component Value Date   ALT 12 05/10/2021   AST 17 05/10/2021   ALKPHOS 66 05/10/2021   BILITOT 0.4 05/10/2021    Lab Results  Component Value Date   TSH 1.940 05/10/2021     Assessment:   1. Well woman exam with routine gynecological exam   2. Encounter to establish care   3. Pelvic pain in female   4. Cervical cancer screening      Plan:  Blood tests: None ordered. Had labs by PCP. Breast self exam technique reviewed and patient encouraged to perform self-exam monthly. Contraception: status post hysterectomy. Discussed healthy lifestyle modifications. Mammogram  to begin at age 1 for routine screens.  Pap smear  not needed. Patient is s/p hysterectomy  . COVID  vaccination status: received 2 series Moderna. Has not received boosters.  Flu vaccine: Up to date Pelvic pain unsure cause. Discussed possibility of microscopic endometriosis vs pelvic floor dysfunction. Will try ovarian suppression with OCPs as patient also noting residual PMS symptoms, and will refer to pelvic floor physical therapy. To f/u in 3 months.  Follow up in 1 year for annual exam   Rubie Maid, MD [facility]

## 2021-06-16 ENCOUNTER — Encounter: Payer: Self-pay | Admitting: Obstetrics and Gynecology

## 2021-06-19 ENCOUNTER — Other Ambulatory Visit: Payer: Self-pay

## 2021-06-20 ENCOUNTER — Other Ambulatory Visit: Payer: Self-pay

## 2021-06-24 ENCOUNTER — Other Ambulatory Visit: Payer: Self-pay

## 2021-06-25 ENCOUNTER — Other Ambulatory Visit: Payer: Self-pay

## 2021-07-01 ENCOUNTER — Other Ambulatory Visit: Payer: Self-pay

## 2021-07-02 ENCOUNTER — Other Ambulatory Visit: Payer: Self-pay

## 2021-07-03 ENCOUNTER — Other Ambulatory Visit: Payer: Self-pay

## 2021-07-04 ENCOUNTER — Other Ambulatory Visit: Payer: Self-pay

## 2021-07-04 ENCOUNTER — Other Ambulatory Visit (HOSPITAL_COMMUNITY): Payer: Self-pay

## 2021-07-04 MED ORDER — VYVANSE 20 MG PO CAPS
ORAL_CAPSULE | ORAL | 0 refills | Status: DC
  Filled 2021-07-04: qty 30, 30d supply, fill #0

## 2021-07-05 ENCOUNTER — Other Ambulatory Visit (HOSPITAL_COMMUNITY): Payer: Self-pay

## 2021-07-12 ENCOUNTER — Encounter: Payer: Self-pay | Admitting: Nurse Practitioner

## 2021-07-12 ENCOUNTER — Ambulatory Visit (INDEPENDENT_AMBULATORY_CARE_PROVIDER_SITE_OTHER): Payer: No Typology Code available for payment source | Admitting: Nurse Practitioner

## 2021-07-12 ENCOUNTER — Other Ambulatory Visit: Payer: Self-pay

## 2021-07-12 VITALS — BP 103/70 | HR 89 | Temp 99.0°F | Wt 152.0 lb

## 2021-07-12 DIAGNOSIS — R109 Unspecified abdominal pain: Secondary | ICD-10-CM | POA: Insufficient documentation

## 2021-07-12 DIAGNOSIS — B9689 Other specified bacterial agents as the cause of diseases classified elsewhere: Secondary | ICD-10-CM | POA: Insufficient documentation

## 2021-07-12 DIAGNOSIS — N760 Acute vaginitis: Secondary | ICD-10-CM | POA: Diagnosis not present

## 2021-07-12 LAB — URINALYSIS, ROUTINE W REFLEX MICROSCOPIC
Bilirubin, UA: NEGATIVE
Glucose, UA: NEGATIVE
Ketones, UA: NEGATIVE
Leukocytes,UA: NEGATIVE
Nitrite, UA: NEGATIVE
Protein,UA: NEGATIVE
RBC, UA: NEGATIVE
Specific Gravity, UA: 1.025 (ref 1.005–1.030)
Urobilinogen, Ur: 1 mg/dL (ref 0.2–1.0)
pH, UA: 6.5 (ref 5.0–7.5)

## 2021-07-12 LAB — WET PREP FOR TRICH, YEAST, CLUE
Clue Cell Exam: POSITIVE — AB
Trichomonas Exam: NEGATIVE
Yeast Exam: NEGATIVE

## 2021-07-12 MED ORDER — METRONIDAZOLE 500 MG PO TABS: 500.0000 mg | ORAL_TABLET | Freq: Two times a day (BID) | ORAL | 0 refills | Status: AC

## 2021-07-12 NOTE — Progress Notes (Signed)
BP 103/70    Pulse 89    Temp 99 F (37.2 C) (Oral)    Wt 152 lb (68.9 kg)    LMP 04/27/2020    SpO2 98%    BMI 29.69 kg/m    Subjective:    Patient ID: Jessica Cross, female    DOB: 07-Apr-1991, 31 y.o.   MRN: [id number]  HPI: Jessica Cross is a 31 y.o. female  Chief Complaint  Patient presents with   Flank Pain    Patient states she was having constant pain in her left side radiating to her back side near her kidney area. Patient states the pain has been an ongoing issue for about 2 months. Patient denies having any urinary tract symptoms. Patient states Ibuprofen up to 800 mg helps ease the pain, but doesn't go away completely.    FLANK PAIN  Present to left side for 2 months and recently this has become more constant and uncomfortable.  Her aunt was diagnosed with kidney cancer a year ago and she has concerns about this, she is in her early 74's Duration: months Onset: sudden Severity: 6/10 Quality: sharp, dull, aching, and throbbing Location:   left flank around to back   Episode duration: all day at times and then at times intermittent Radiation:  around to back Frequency: intermittent Alleviating factors: Ibuprofen 800 MG does offer benefit Aggravating factors: nothing Status: fluctuating -- no pain today Treatments attempted:  Ibuprofen Fever: no Nausea: no Vomiting: no Weight loss: no Decreased appetite: no Diarrhea: yes Constipation: yes Blood in stool: no Heartburn:  started taking Pepcid, has had more indigestion Jaundice: no Rash: no Dysuria/urinary frequency: no Hematuria: no History of sexually transmitted disease: no Recurrent NSAID use: no   Depression screen [facility] 2/9 07/12/2021 06/14/2021 05/10/2021 03/29/2021 10/12/2020  Decreased Interest '1 1 1 1 1  ' Down, Depressed, Hopeless 1 1 0 1 0  PHQ - 2 Score '2 2 1 2 1  ' Altered sleeping '2 1 1 2 ' 0  Tired, decreased energy '1 1 2 2 3  ' Change in appetite 0 0 1 0 0  Feeling bad or failure about yourself  0 0 0  1 0  Trouble concentrating '1 2 2 2 ' 0  Moving slowly or fidgety/restless 0 0 0 1 0  Suicidal thoughts 0 0 0 0 0  PHQ-9 Score '6 6 7 10 4  ' Difficult doing work/chores Not difficult at all Not difficult at all Not difficult at all Somewhat difficult -  Some recent data might be hidden   GAD 7 : Generalized Anxiety Score 07/12/2021 06/14/2021 05/10/2021 10/12/2020  Nervous, Anxious, on Edge '1 2 1 1  ' Control/stop worrying '2 2 2 1  ' Worry too much - different things '2 2 2 1  ' Trouble relaxing 0 1 1 0  Restless 0 1 1 0  Easily annoyed or irritable '1 1 1 3  ' Afraid - awful might happen '2 1 2 ' 0  Total GAD 7 Score '8 10 10 6  ' Anxiety Difficulty Somewhat difficult Somewhat difficult Not difficult at all -     Relevant past medical, surgical, family and social history reviewed and updated as indicated. Interim medical history since our last visit reviewed. Allergies and medications reviewed and updated.  Review of Systems  Constitutional:  Negative for activity change, appetite change, diaphoresis, fatigue and fever.  Respiratory:  Negative for cough, chest tightness and shortness of breath.   Cardiovascular:  Negative for chest pain, palpitations and leg  swelling.  Gastrointestinal:  Positive for abdominal pain, constipation and diarrhea. Negative for abdominal distention, nausea and vomiting.  Genitourinary:  Negative for decreased urine volume, dysuria, frequency, hematuria and urgency.  Neurological: Negative.   Psychiatric/Behavioral: Negative.     Per HPI unless specifically indicated above     Objective:    BP 103/70    Pulse 89    Temp 99 F (37.2 C) (Oral)    Wt 152 lb (68.9 kg)    LMP 04/27/2020    SpO2 98%    BMI 29.69 kg/m   Wt Readings from Last 3 Encounters:  07/12/21 152 lb (68.9 kg)  06/14/21 151 lb 11.2 oz (68.8 kg)  05/10/21 154 lb 9.6 oz (70.1 kg)    Physical Exam Vitals and nursing note reviewed.  Constitutional:      General: She is awake. She is not in acute  distress.    Appearance: She is well-developed and well-groomed. She is not ill-appearing.  HENT:     Head: Normocephalic.     Right Ear: Hearing normal.     Left Ear: Hearing normal.  Eyes:     General: Lids are normal.        Right eye: No discharge.        Left eye: No discharge.     Conjunctiva/sclera: Conjunctivae normal.     Pupils: Pupils are equal, round, and reactive to light.  Neck:     Thyroid: No thyromegaly.     Vascular: No carotid bruit or JVD.  Cardiovascular:     Rate and Rhythm: Normal rate and regular rhythm.     Heart sounds: Normal heart sounds. No murmur heard.   No gallop.  Pulmonary:     Effort: Pulmonary effort is normal. No accessory muscle usage or respiratory distress.     Breath sounds: Normal breath sounds.  Abdominal:     General: Bowel sounds are normal. There is no distension.     Palpations: Abdomen is soft. There is no hepatomegaly.     Tenderness: There is generalized abdominal tenderness. There is no right CVA tenderness, left CVA tenderness, guarding or rebound. Negative signs include Murphy's sign.  Musculoskeletal:     Cervical back: Normal range of motion and neck supple.     Right lower leg: No edema.     Left lower leg: No edema.  Lymphadenopathy:     Cervical: No cervical adenopathy.  Skin:    General: Skin is warm and dry.  Neurological:     Mental Status: She is alert and oriented to person, place, and time.  Psychiatric:        Attention and Perception: Attention normal.        Mood and Affect: Mood normal.        Speech: Speech normal.        Behavior: Behavior normal. Behavior is cooperative.        Thought Content: Thought content normal.    Results for orders placed or performed in visit on 05/10/21  CBC with Differential/Platelet  Result Value Ref Range   WBC 6.4 3.4 - 10.8 x10E3/uL   RBC 4.56 3.77 - 5.28 x10E6/uL   Hemoglobin 13.6 11.1 - 15.9 g/dL   Hematocrit 40.7 34.0 - 46.6 %   MCV 89 79 - 97 fL   MCH 29.8  26.6 - 33.0 pg   MCHC 33.4 31.5 - 35.7 g/dL   RDW 11.9 11.7 - 15.4 %   Platelets 262 150 - 450  x10E3/uL   Neutrophils 53 Not Estab. %   Lymphs 37 Not Estab. %   Monocytes 7 Not Estab. %   Eos 2 Not Estab. %   Basos 1 Not Estab. %   Neutrophils Absolute 3.4 1.4 - 7.0 x10E3/uL   Lymphocytes Absolute 2.4 0.7 - 3.1 x10E3/uL   Monocytes Absolute 0.4 0.1 - 0.9 x10E3/uL   EOS (ABSOLUTE) 0.1 0.0 - 0.4 x10E3/uL   Basophils Absolute 0.0 0.0 - 0.2 x10E3/uL   Immature Granulocytes 0 Not Estab. %   Immature Grans (Abs) 0.0 0.0 - 0.1 x10E3/uL  Comprehensive metabolic panel  Result Value Ref Range   Glucose 83 70 - 99 mg/dL   BUN 15 6 - 20 mg/dL   Creatinine, Ser 0.70 0.57 - 1.00 mg/dL   eGFR 118 >59 mL/min/1.73   BUN/Creatinine Ratio 21 9 - 23   Sodium 135 134 - 144 mmol/L   Potassium 4.2 3.5 - 5.2 mmol/L   Chloride 98 96 - 106 mmol/L   CO2 22 20 - 29 mmol/L   Calcium 9.7 8.7 - 10.2 mg/dL   Total Protein 7.5 6.0 - 8.5 g/dL   Albumin 5.1 (H) 3.8 - 4.8 g/dL   Globulin, Total 2.4 1.5 - 4.5 g/dL   Albumin/Globulin Ratio 2.1 1.2 - 2.2   Bilirubin Total 0.4 0.0 - 1.2 mg/dL   Alkaline Phosphatase 66 44 - 121 IU/L   AST 17 0 - 40 IU/L   ALT 12 0 - 32 IU/L  Lipid Panel w/o Chol/HDL Ratio  Result Value Ref Range   Cholesterol, Total 206 (H) 100 - 199 mg/dL   Triglycerides 106 0 - 149 mg/dL   HDL 70 >39 mg/dL   VLDL Cholesterol Cal 19 5 - 40 mg/dL   LDL Chol Calc (NIH) 117 (H) 0 - 99 mg/dL  TSH  Result Value Ref Range   TSH 1.940 0.450 - 4.500 uIU/mL  Iron, TIBC and Ferritin Panel  Result Value Ref Range   Total Iron Binding Capacity 352 250 - 450 ug/dL   UIBC 275 131 - 425 ug/dL   Iron 77 27 - 159 ug/dL   Iron Saturation 22 15 - 55 %   Ferritin 120 15 - 150 ng/mL  Vitamin B12  Result Value Ref Range   Vitamin B-12 513 232 - 1,245 pg/mL  Hepatitis C antibody  Result Value Ref Range   Hep C Virus Ab <0.1 0.0 - 0.9 s/co ratio  HIV Antibody (routine testing w rflx)  Result Value Ref  Range   HIV Screen 4th Generation wRfx Non Reactive Non Reactive      Assessment & Plan:   Problem List Items Addressed This Visit       Genitourinary   Bacterial vaginosis    UA negative overall.  Wet prep + clue cells, educated patient on finding and treatment.  At this time start Flagyl BID x 7 days, take with probiotic yogurt and avoid alcohol while taking. If ongoing pain post treatment then return to office.      Relevant Medications   metroNIDAZOLE (FLAGYL) 500 MG tablet     Other   Flank pain - Primary    Acute for 2 months with recent worsening.  UA negative overall.  Wet prep + clue cells, educated patient on finding and treatment.  At this time start Flagyl BID x 7 days, take with probiotic yogurt and avoid alcohol while taking. If ongoing pain post treatment then return to office.  Relevant Orders   WET PREP FOR TRICH, YEAST, CLUE   Urinalysis, Routine w reflex microscopic     Follow up plan: Return if symptoms worsen or fail to improve.

## 2021-07-12 NOTE — Assessment & Plan Note (Signed)
Acute for 2 months with recent worsening.  UA negative overall.  Wet prep + clue cells, educated patient on finding and treatment.  At this time start Flagyl BID x 7 days, take with probiotic yogurt and avoid alcohol while taking. If ongoing pain post treatment then return to office.

## 2021-07-12 NOTE — Patient Instructions (Signed)

## 2021-07-12 NOTE — Assessment & Plan Note (Signed)
UA negative overall.  Wet prep + clue cells, educated patient on finding and treatment.  At this time start Flagyl BID x 7 days, take with probiotic yogurt and avoid alcohol while taking. If ongoing pain post treatment then return to office.

## 2021-07-25 ENCOUNTER — Ambulatory Visit: Payer: No Typology Code available for payment source | Admitting: Dermatology

## 2021-07-25 ENCOUNTER — Other Ambulatory Visit: Payer: Self-pay

## 2021-07-25 DIAGNOSIS — L578 Other skin changes due to chronic exposure to nonionizing radiation: Secondary | ICD-10-CM | POA: Diagnosis not present

## 2021-07-25 DIAGNOSIS — L814 Other melanin hyperpigmentation: Secondary | ICD-10-CM

## 2021-07-25 DIAGNOSIS — D229 Melanocytic nevi, unspecified: Secondary | ICD-10-CM | POA: Diagnosis not present

## 2021-07-25 DIAGNOSIS — Z1283 Encounter for screening for malignant neoplasm of skin: Secondary | ICD-10-CM | POA: Diagnosis not present

## 2021-07-25 DIAGNOSIS — D1800 Hemangioma unspecified site: Secondary | ICD-10-CM

## 2021-07-25 DIAGNOSIS — L821 Other seborrheic keratosis: Secondary | ICD-10-CM

## 2021-07-25 NOTE — Progress Notes (Signed)
° °  New Patient Visit  Subjective  Jessica Cross is a 31 y.o. female who presents for the following: FBSE (Patient here for full body skin exam and skin cancer screening. Patient with no personal or family history of skin cancer. Patient has used tanning beds in the past. Patient does have a spot at right neck that has stood out to her but is not changing, itchy or sore. ).   The following portions of the chart were reviewed this encounter and updated as appropriate:   Tobacco   Allergies   Meds   Problems   Med Hx   Surg Hx   Fam Hx       Review of Systems:  No other skin or systemic complaints except as noted in HPI or Assessment and Plan.  Objective  Well appearing patient in no apparent distress; mood and affect are within normal limits.  A full examination was performed including scalp, head, eyes, ears, nose, lips, neck, chest, axillae, abdomen, back, buttocks, bilateral upper extremities, bilateral lower extremities, hands, feet, fingers, toes, fingernails, and toenails. All findings within normal limits unless otherwise noted below.    Assessment & Plan   Lentigines - Scattered tan macules - Due to sun exposure - Benign-appearing, observe - Recommend daily broad spectrum sunscreen SPF 30+ to sun-exposed areas, reapply every 2 hours as needed. - Call for any changes  Seborrheic Keratoses - Stuck-on, waxy, tan-brown papules and/or plaques  - Benign-appearing - Discussed benign etiology and prognosis. - Observe - Call for any changes  Melanocytic Nevi - Tan-brown and/or pink-flesh-colored symmetric macules and papules - Benign appearing on exam today - Observation - Call clinic for new or changing moles - Recommend daily use of broad spectrum spf 30+ sunscreen to sun-exposed areas.   Hemangiomas - Red papules - Discussed benign nature - Observe - Call for any changes  Actinic Damage - Chronic condition, secondary to cumulative UV/sun exposure - diffuse scaly  erythematous macules with underlying dyspigmentation - Recommend daily broad spectrum sunscreen SPF 30+ to sun-exposed areas, reapply every 2 hours as needed.  - Staying in the shade or wearing long sleeves, sun glasses (UVA+UVB protection) and wide brim hats (4-inch brim around the entire circumference of the hat) are also recommended for sun protection.  - Call for new or changing lesions.  Skin cancer screening performed today.  Return for TBSE 2-3 years.  Graciella Belton, RMA, am acting as scribe for Forest Gleason, MD .  Documentation: I have reviewed the above documentation for accuracy and completeness, and I agree with the above.  Forest Gleason, MD

## 2021-07-25 NOTE — Patient Instructions (Signed)
Recommend taking Heliocare sun protection supplement daily in sunny weather for additional sun protection. For maximum protection on the sunniest days, you can take up to 2 capsules of regular Heliocare OR take 1 capsule of Heliocare Ultra. For prolonged exposure (such as a full day in the sun), you can repeat your dose of the supplement 4 hours after your first dose. Heliocare can be purchased at [employer], at some [facility] or at [email or web address].    Recommend daily broad spectrum sunscreen SPF 30+ to sun-exposed areas, reapply every 2 hours as needed. Call for new or changing lesions.  Staying in the shade or wearing long sleeves, sun glasses (UVA+UVB protection) and wide brim hats (4-inch brim around the entire circumference of the hat) are also recommended for sun protection.   Melanoma ABCDEs  Melanoma is the most dangerous type of skin cancer, and is the leading cause of death from skin disease.  You are more likely to develop melanoma if you: Have light-colored skin, light-colored eyes, or red or blond hair Spend a lot of time in the sun Tan regularly, either outdoors or in a tanning bed Have had blistering sunburns, especially during childhood Have a close family member who has had a melanoma Have atypical moles or large birthmarks  Early detection of melanoma is key since treatment is typically straightforward and cure rates are extremely high if we catch it early.   The first sign of melanoma is often a change in a mole or a new dark spot.  The ABCDE system is a way of remembering the signs of melanoma.  A for asymmetry:  The two halves do not match. B for border:  The edges of the growth are irregular. C for color:  A mixture of colors are present instead of an even brown color. D for diameter:  Melanomas are usually (but not always) greater than 92mm - the size of a pencil eraser. E for evolution:  The spot keeps changing in size, shape, and color.  Please check your  skin once per month between visits. You can use a small mirror in front and a large mirror behind you to keep an eye on the back side or your body.   If you see any new or changing lesions before your next follow-up, please call to schedule a visit.  Please continue daily skin protection including broad spectrum sunscreen SPF 30+ to sun-exposed areas, reapplying every 2 hours as needed when you're outdoors.    If You Need Anything After Your Visit  If you have any questions or concerns for your doctor, please call our main line at [phone] and press option 4 to reach your doctor's medical assistant. If no one answers, please leave a voicemail as directed and we will return your call as soon as possible. Messages left after 4 pm will be answered the following business day.   You may also send [place] a message via [place]. We typically respond to MyChart messages within 1-2 business days.  For prescription refills, please ask your pharmacy to contact our office. Our fax number is [phone].  If you have an urgent issue when the clinic is closed that cannot wait until the next business day, you can page your doctor at the number below.    Please note that while we do our best to be available for urgent issues outside of office hours, we are not available 24/7.   If you have an urgent issue and are unable to reach  [place], you may choose to seek medical care at your doctor's office, retail clinic, urgent care center, or emergency room.  If you have a medical emergency, please immediately call 911 or go to the emergency department.  Pager Numbers  - Dr. Nehemiah Massed: [phone]  - Dr. Laurence Ferrari: [phone]  - Dr. Nicole Kindred: [phone]  In the event of inclement weather, please call our main line at [phone] for an update on the status of any delays or closures.  Dermatology Medication Tips: Please keep the boxes that topical medications come in in order to help keep track of the instructions  about where and how to use these. Pharmacies typically print the medication instructions only on the boxes and not directly on the medication tubes.   If your medication is too expensive, please contact our office at [phone] option 4 or send [place] a message through [place].   We are unable to tell what your co-pay for medications will be in advance as this is different depending on your insurance coverage. However, we may be able to find a substitute medication at lower cost or fill out paperwork to get insurance to cover a needed medication.   If a prior authorization is required to get your medication covered by your insurance company, please allow [place] 1-2 business days to complete this process.  Drug prices often vary depending on where the prescription is filled and some pharmacies may offer cheaper prices.  The website www.goodrx.com contains coupons for medications through different pharmacies. The prices here do not account for what the cost may be with help from insurance (it may be cheaper with your insurance), but the website can give you the price if you did not use any insurance.  - You can print the associated coupon and take it with your prescription to the pharmacy.  - You may also stop by our office during regular business hours and pick up a GoodRx coupon card.  - If you need your prescription sent electronically to a different pharmacy, notify our office through [facility] or by phone at [phone] option 4.     Si Usted Necesita Algo Despus de Su Visita  Tambin puede enviarnos un mensaje a travs de Pharmacist, community. Por lo general respondemos a los mensajes de MyChart en el transcurso de 1 a 2 das hbiles.  Para renovar recetas, por favor pida a su farmacia que se ponga en contacto con nuestra oficina. Harland Dingwall de fax es [place] [phone].  Si tiene un asunto urgente cuando la clnica est cerrada y que no puede esperar hasta el siguiente da hbil, puede  llamar/localizar a su doctor(a) al nmero que aparece a continuacin.   Por favor, tenga en cuenta que aunque hacemos todo lo posible para estar disponibles para asuntos urgentes fuera del horario de [place], no estamos disponibles las 24 horas del da, los 7 das de la [place].   Si tiene un problema urgente y no puede comunicarse con nosotros, puede optar por buscar atencin mdica  en el consultorio de su doctor(a), en una clnica privada, en un centro de atencin urgente o en una sala de emergencias.  Si tiene Engineering geologist, por favor llame inmediatamente al 911 o vaya a la sala de emergencias.  Nmeros de bper  - Dr. Nehemiah Massed: [phone]  - Dra. Moye: [phone]  - Dra. Nicole Kindred: [phone]  En caso de inclemencias del [place], por favor llame a Johnsie Kindred principal al [phone] para una actualizacin sobre el [place] de cualquier Shelda Jakes  o cierre.  Consejos para la medicacin en dermatologa: Por favor, guarde las cajas en las que vienen los medicamentos de uso tpico para ayudarle a seguir las instrucciones sobre dnde y cmo usarlos. Las farmacias generalmente imprimen las instrucciones del medicamento slo en las cajas y no directamente en los tubos del [place].   Si su medicamento es muy caro, por favor, pngase en contacto con Zigmund Daniel llamando al [phone] y presione la opcin 4 o envenos un mensaje a travs de Pharmacist, community.   No podemos decirle cul ser su copago por los medicamentos por adelantado ya que esto es diferente dependiendo de la cobertura de su seguro. Sin embargo, es posible que podamos encontrar un medicamento sustituto a Electrical engineer un formulario para que el seguro cubra el medicamento que se considera necesario.   Si se requiere una autorizacin previa para que su compaa de seguros [place] su medicamento, por favor permtanos de 1 a 2 das hbiles para completar este proceso.  Los precios de los medicamentos varan con  frecuencia dependiendo del Environmental consultant de dnde se surte la receta y alguna farmacias pueden ofrecer precios ms baratos.  El sitio web www.goodrx.com tiene cupones para medicamentos de Airline pilot. Los precios aqu no tienen en cuenta lo que podra costar con la ayuda del seguro (puede ser ms barato con su seguro), pero el sitio web puede darle el precio si no utiliz Research scientist (physical sciences).  - Puede imprimir el cupn correspondiente y llevarlo con su receta a la farmacia.  - Tambin puede pasar por nuestra oficina durante el horario de atencin regular y Charity fundraiser una tarjeta de cupones de GoodRx.  - Si necesita que su receta se enve electrnicamente a una farmacia diferente, informe a nuestra oficina a travs de MyChart de [place] o por telfono llamando al [phone] y presione la opcin 4.

## 2021-08-01 ENCOUNTER — Encounter: Payer: Self-pay | Admitting: Dermatology

## 2021-09-02 ENCOUNTER — Encounter: Payer: Self-pay | Admitting: Nurse Practitioner

## 2021-09-02 MED FILL — Bupropion HCl Tab ER 24HR 150 MG: ORAL | 90 days supply | Qty: 90 | Fill #1 | Status: AC

## 2021-09-03 ENCOUNTER — Other Ambulatory Visit (HOSPITAL_COMMUNITY): Payer: Self-pay

## 2021-09-04 ENCOUNTER — Other Ambulatory Visit (HOSPITAL_COMMUNITY): Payer: Self-pay

## 2021-09-06 ENCOUNTER — Other Ambulatory Visit: Payer: Self-pay

## 2021-09-06 ENCOUNTER — Ambulatory Visit (INDEPENDENT_AMBULATORY_CARE_PROVIDER_SITE_OTHER): Payer: No Typology Code available for payment source | Admitting: Nurse Practitioner

## 2021-09-06 ENCOUNTER — Encounter: Payer: Self-pay | Admitting: Nurse Practitioner

## 2021-09-06 VITALS — BP 98/64 | HR 74 | Temp 99.2°F | Ht 60.0 in | Wt 148.0 lb

## 2021-09-06 DIAGNOSIS — F331 Major depressive disorder, recurrent, moderate: Secondary | ICD-10-CM

## 2021-09-06 DIAGNOSIS — F902 Attention-deficit hyperactivity disorder, combined type: Secondary | ICD-10-CM

## 2021-09-06 DIAGNOSIS — Z79899 Other long term (current) drug therapy: Secondary | ICD-10-CM | POA: Insufficient documentation

## 2021-09-06 DIAGNOSIS — F419 Anxiety disorder, unspecified: Secondary | ICD-10-CM | POA: Diagnosis not present

## 2021-09-06 MED ORDER — LISDEXAMFETAMINE DIMESYLATE 20 MG PO CAPS
20.0000 mg | ORAL_CAPSULE | Freq: Every day | ORAL | 0 refills | Status: DC
  Filled 2021-09-06: qty 30, 30d supply, fill #0

## 2021-09-06 MED ORDER — LISDEXAMFETAMINE DIMESYLATE 20 MG PO CAPS
20.0000 mg | ORAL_CAPSULE | Freq: Every day | ORAL | 0 refills | Status: DC
  Filled 2021-11-18: qty 30, 30d supply, fill #0

## 2021-09-06 MED ORDER — LISDEXAMFETAMINE DIMESYLATE 20 MG PO CAPS
20.0000 mg | ORAL_CAPSULE | Freq: Every day | ORAL | 0 refills | Status: DC
  Filled 2021-10-14: qty 30, 30d supply, fill #0

## 2021-09-06 NOTE — Assessment & Plan Note (Addendum)
Chronic, ongoing. At this time continue Wellbutrin 150 MG XL daily as is offering benefit, however made her aware of possibility for interactions with Vyvanse and her Metoprolol -- if these present I recommend stopping Wellbutrin and switching to an alternative such as SSRI or SNRI.  Refills up to date.  She denies SI/HI.  May also benefit from therapy on regular basis.   

## 2021-09-06 NOTE — Assessment & Plan Note (Signed)
Refer to depression plan of care. 

## 2021-09-06 NOTE — Progress Notes (Signed)
? ?BP 98/64   Pulse 74   Temp 99.2 ?F (37.3 ?C) (Oral)   Ht 5' (1.524 m)   Wt 148 lb (67.1 kg)   LMP 04/27/2020   SpO2 98%   BMI 28.90 kg/m?   ? ?Subjective:  ? ? Patient ID: Jessica Cross, female    DOB: 03-Nov-1990, 31 y.o.   MRN: [id number] ? ?HPI: ?Jessica Cross is a 31 y.o. female ? ?Chief Complaint  ?Patient presents with  ? Medication Management  ?  Patient is here to follow up on Vyvanse medication. Patient states he is doing good with medication.   ? ?ADHD FOLLOW UP ?Was diagnosed by [facility], in November and started on Vyvanse 20 MG daily and this is working well.  Last fill January 2023 on review PDMP.  Would like PCP to take over refills, we discussed office rules today and she is in agreeance. ?ADHD status: stable ?Satisfied with current therapy: yes ?Medication compliance:  good compliance ?Controlled substance contract: yes ?Previous psychiatry evaluation: yes ?Previous medications: Vyvanse   ?Taking meds on weekends/vacations: no ?Work/school performance:  good ?Difficulty sustaining attention/completing tasks:  a little bit ?Distracted by extraneous stimuli: no ?Does not listen when spoken to:  a little   ?Fidgets with hands or feet: no ?Unable to stay in seat: no ?Blurts out/interrupts others: no ?ADHD Medication Side Effects: yes ?   Decreased appetite: at times and has some reflux ?   Headache: no ?   Sleeping disturbance pattern: no ?   Irritability: no ?   Rebound effects (worse than baseline) off medication: no ?   Anxiousness: no ?   Dizziness: no ?   Tics: no  ? ?Depression screen [facility] 2/9 09/06/2021 07/12/2021 06/14/2021 05/10/2021 03/29/2021  ?Decreased Interest '1 1 1 1 1  '$ ?Down, Depressed, Hopeless '1 1 1 '$ 0 1  ?PHQ - 2 Score '2 2 2 1 2  '$ ?Altered sleeping '2 2 1 1 2  '$ ?Tired, decreased energy '1 1 1 2 2  '$ ?Change in appetite 0 0 0 1 0  ?Feeling bad or failure about yourself  0 0 0 0 1  ?Trouble concentrating '2 1 2 2 2  '$ ?Moving slowly or fidgety/restless 0 0 0 0 1   ?Suicidal thoughts 0 0 0 0 0  ?PHQ-9 Score '7 6 6 7 10  '$ ?Difficult doing work/chores - Not difficult at all Not difficult at all Not difficult at all Somewhat difficult  ?Some recent data might be hidden  ?  ? ?Relevant past medical, surgical, family and social history reviewed and updated as indicated. Interim medical history since our last visit reviewed. ?Allergies and medications reviewed and updated. ? ?Review of Systems  ?Constitutional:  Negative for activity change, appetite change, chills, fatigue and fever.  ?Respiratory:  Negative for cough, chest tightness and wheezing.   ?Gastrointestinal: Negative.   ?Endocrine: Negative for cold intolerance and heat intolerance.  ?Neurological:  Negative for dizziness, tremors, weakness, light-headedness, numbness and headaches.  ? ?Per HPI unless specifically indicated above ? ?   ?Objective:  ?  ?BP 98/64   Pulse 74   Temp 99.2 ?F (37.3 ?C) (Oral)   Ht 5' (1.524 m)   Wt 148 lb (67.1 kg)   LMP 04/27/2020   SpO2 98%   BMI 28.90 kg/m?   ?Wt Readings from Last 3 Encounters:  ?09/06/21 148 lb (67.1 kg)  ?07/12/21 152 lb (68.9 kg)  ?06/14/21 151 lb 11.2 oz (68.8 kg)  ?  ?  Physical Exam ?Vitals and nursing note reviewed.  ?Constitutional:   ?   General: She is awake. She is not in acute distress. ?   Appearance: She is well-developed and well-groomed. She is not ill-appearing.  ?HENT:  ?   Head: Normocephalic.  ?   Right Ear: Hearing normal.  ?   Left Ear: Hearing normal.  ?Eyes:  ?   General: Lids are normal.     ?   Right eye: No discharge.     ?   Left eye: No discharge.  ?   Conjunctiva/sclera: Conjunctivae normal.  ?   Pupils: Pupils are equal, round, and reactive to light.  ?Neck:  ?   Thyroid: No thyromegaly.  ?   Vascular: No carotid bruit or JVD.  ?Cardiovascular:  ?   Rate and Rhythm: Normal rate and regular rhythm.  ?   Heart sounds: Normal heart sounds. No murmur heard. ?  No gallop.  ?Pulmonary:  ?   Effort: Pulmonary effort is normal. No accessory  muscle usage or respiratory distress.  ?   Breath sounds: Normal breath sounds.  ?Abdominal:  ?   General: Bowel sounds are normal.  ?   Palpations: Abdomen is soft.  ?Musculoskeletal:  ?   Cervical back: Normal range of motion and neck supple.  ?   Right lower leg: No edema.  ?   Left lower leg: No edema.  ?Lymphadenopathy:  ?   Cervical: No cervical adenopathy.  ?Skin: ?   General: Skin is warm and dry.  ?Neurological:  ?   Mental Status: She is alert and oriented to person, place, and time.  ?Psychiatric:     ?   Attention and Perception: Attention normal.     ?   Mood and Affect: Mood normal.     ?   Speech: Speech normal.     ?   Behavior: Behavior normal. Behavior is cooperative.     ?   Thought Content: Thought content normal.  ? ? ?Results for orders placed or performed in visit on 07/12/21  ?WET PREP FOR [place], YEAST, CLUE  ? Urine  ?Result Value Ref Range  ? Trichomonas Exam Negative Negative  ? Yeast Exam Negative Negative  ? Clue Cell Exam Positive (A) Negative  ?Urinalysis, Routine w reflex microscopic  ?Result Value Ref Range  ? Specific Gravity, UA 1.025 1.005 - 1.030  ? pH, UA 6.5 5.0 - 7.5  ? Color, UA Yellow Yellow  ? Appearance Ur Clear Clear  ? Leukocytes,UA Negative Negative  ? Protein,UA Negative Negative/Trace  ? Glucose, UA Negative Negative  ? Ketones, UA Negative Negative  ? RBC, UA Negative Negative  ? Bilirubin, UA Negative Negative  ? Urobilinogen, Ur 1.0 0.2 - 1.0 mg/dL  ? Nitrite, UA Negative Negative  ? ?   ?Assessment & Plan:  ? ?Problem List Items Addressed This Visit   ? ?  ? Other  ? ADHD (attention deficit hyperactivity disorder)  ?  Diagnosed at [facility] who have prescribed her Vyvanse. At this time will also continue Wellbutrin 150 MG XL daily as is offering benefit and she is fearful of stopping due to this, however made her aware of possibility for interactions with Vyvanse and her Metoprolol while on Wellbutrin.  Will take over Vyvanse refills for her.   She is aware of office rules on this and agreeable to yearly UDS and controlled subs agreement.  Refills sent and urine obtained today.  Return in 3  months. ?  ?  ? Relevant Orders  ? [id number] 11+Oxyco+Alc+Crt-Bund  ? Anxiety  ?  Refer to depression plan of care. ?  ?  ? Controlled substance agreement signed  ?  Signed on 09/06/21 and discussed/reviewed with patient. ?  ?  ? Depression - Primary  ?  Chronic, ongoing. At this time continue Wellbutrin 150 MG XL daily as is offering benefit, however made her aware of possibility for interactions with Vyvanse and her Metoprolol -- if these present I recommend stopping Wellbutrin and switching to an alternative such as SSRI or SNRI.  Refills up to date.  She denies SI/HI.  May also benefit from therapy on regular basis.   ?  ?  ?  ? ?Follow up plan: ?Return in about 3 months (around 12/07/2021) for ADHD and MOOD -- cancel May visit. ? ? ? ? ? ?

## 2021-09-06 NOTE — Assessment & Plan Note (Signed)
Diagnosed at [facility] who have prescribed her Vyvanse. At this time will also continue Wellbutrin 150 MG XL daily as is offering benefit and she is fearful of stopping due to this, however made her aware of possibility for interactions with Vyvanse and her Metoprolol while on Wellbutrin.  Will take over Vyvanse refills for her.  She is aware of office rules on this and agreeable to yearly UDS and controlled subs agreement.  Refills sent and urine obtained today.  Return in 3 months. ?

## 2021-09-06 NOTE — Patient Instructions (Signed)
Living With Attention Deficit Hyperactivity Disorder If you have been diagnosed with attention deficit hyperactivity disorder (ADHD), you may be relieved that you now know why you have felt or behaved a certain way. Still, you may feel overwhelmed about the treatment ahead. You may also wonder how to get the support you need and how to deal with the condition day-to-day. With treatment and support, you can live with ADHD and manage your symptoms. How to manage lifestyle changes Managing stress Stress is your body's reaction to life changes and events, both good and bad. To cope with the stress of an ADHD diagnosis, it may help to: Learn more about ADHD. Exercise regularly. Even a short daily walk can lower stress levels. Participate in training or education programs (including social skills training classes) that teach you to deal with symptoms.  Medicines Your health care provider may suggest certain medicines if he or she feels that they will help to improve your condition. Stimulant medicines are usually prescribed to treat ADHD, and therapy may also be prescribed. It is important to: Avoid using alcohol and other substances that may prevent your medicines from working properly (may interact). Talk with your pharmacist or health care provider about all the medicines that you take, their possible side effects, and what medicines are safe to take together. Make it your goal to take part in all treatment decisions (shared decision-making). Ask about possible side effects of medicines that your health care provider recommends, and tell him or her how you feel about having those side effects. It is best if shared decision-making with your health care provider is part of your total treatment plan. Relationships To strengthen your relationships with family members while treating your condition, consider taking part in family therapy. You might also attend self-help groups alone or with a loved one. Be  honest about how your symptoms affect your relationships. Make an effort to communicate respectfully instead of fighting, and find ways to show others that you care. Psychotherapy may be useful in helping you cope with how ADHD affects your relationships. How to recognize changes in your condition The following signs may mean that your treatment is working well and your condition is improving: Consistently being on time for appointments. Being more organized at home and work. Other people noticing improvements in your behavior. Achieving goals that you set for yourself. Thinking more clearly. The following signs may mean that your treatment is not working very well: Feeling impatience or more confusion. Missing, forgetting, or being late for appointments. An increasing sense of disorganization and messiness. More difficulty in reaching goals that you set for yourself. Loved ones becoming angry or frustrated with you. Follow these instructions at home: Take over-the-counter and prescription medicines only as told by your health care provider. Check with your health care provider before taking any new medicines. Create structure and an organized atmosphere at home. For example: Make a list of tasks, then rank them from most important to least important. Work on one task at a time until your listed tasks are done. Make a daily schedule and follow it consistently every day. Use an appointment calendar, and check it 2 or 3 times a day to keep on track. Keep it with you when you leave the house. Create spaces where you keep certain things, and always put things back in their places after you use them. Keep all follow-up visits as told by your health care provider. This is important. Where to find support Talking to others    Keep emotion out of important discussions and speak in a calm, logical way. Listen closely and patiently to your loved ones. Try to understand their point of view, and try to  avoid getting defensive. Take responsibility for the consequences of your actions. Ask that others do not take your behaviors personally. Aim to solve problems as they come up, and express your feelings instead of bottling them up. Talk openly about what you need from your loved ones and how they can support you. Consider going to family therapy sessions or having your family meet with a specialist who deals with ADHD-related behavior problems. Finances Not all insurance plans cover mental health care, so it is important to check with your insurance carrier. If paying for co-pays or counseling services is a problem, search for a local or county mental health care center. Public mental health care services may be offered there at a low cost or no cost when you are not able to see a private health care provider. If you are taking medicine for ADHD, you may be able to get the generic form, which may be less expensive than brand-name medicine. Some makers of prescription medicines also offer help to patients who cannot afford the medicines that they need. Questions to ask your health care provider: What are the risks and benefits of taking medicines? Would I benefit from therapy? How often should I follow up with a health care provider? Contact a health care provider if: You have side effects from your medicines, such as: Repeated muscle twitches, coughing, or speech outbursts. Sleep problems. Loss of appetite. Depression. New or worsening behavior problems. Dizziness. Unusually fast heartbeat. Stomach pains. Headaches. Get help right away if: You have a severe reaction to a medicine. Your behavior suddenly gets worse. Summary With treatment and support, you can live with ADHD and manage your symptoms. The medicines that are most often prescribed for ADHD are stimulants. Consider taking part in family therapy or self-help groups with family members or friends. When you talk with friends  and family about your ADHD, be patient and communicate openly. Take over-the-counter and prescription medicines only as told by your health care provider. Check with your health care provider before taking any new medicines. This information is not intended to replace advice given to you by your health care provider. Make sure you discuss any questions you have with your health care provider. Document Revised: 11/23/2019 Document Reviewed: 11/23/2019 Elsevier Patient Education  2022 [employer]  

## 2021-09-06 NOTE — Assessment & Plan Note (Signed)
Signed on 09/06/21 and discussed/reviewed with patient. ?

## 2021-09-08 LAB — DRUG SCREEN 764883 11+OXYCO+ALC+CRT-BUND
Amphetamines, Urine: NEGATIVE ng/mL
BENZODIAZ UR QL: NEGATIVE ng/mL
Barbiturate: NEGATIVE ng/mL
Cannabinoid Quant, Ur: NEGATIVE ng/mL
Cocaine (Metabolite): NEGATIVE ng/mL
Creatinine: 203.9 mg/dL (ref 20.0–300.0)
Ethanol: NEGATIVE %
Meperidine: NEGATIVE ng/mL
Methadone Screen, Urine: NEGATIVE ng/mL
OPIATE SCREEN URINE: NEGATIVE ng/mL
Oxycodone/Oxymorphone, Urine: NEGATIVE ng/mL
Phencyclidine: NEGATIVE ng/mL
Propoxyphene: NEGATIVE ng/mL
Tramadol: NEGATIVE ng/mL
pH, Urine: 5.6 (ref 4.5–8.9)

## 2021-09-12 ENCOUNTER — Telehealth: Payer: No Typology Code available for payment source | Admitting: Obstetrics and Gynecology

## 2021-09-27 ENCOUNTER — Encounter: Payer: No Typology Code available for payment source | Admitting: Nurse Practitioner

## 2021-10-14 ENCOUNTER — Other Ambulatory Visit: Payer: Self-pay | Admitting: Nurse Practitioner

## 2021-10-14 ENCOUNTER — Other Ambulatory Visit: Payer: Self-pay

## 2021-11-08 ENCOUNTER — Ambulatory Visit: Payer: No Typology Code available for payment source | Admitting: Nurse Practitioner

## 2021-11-18 ENCOUNTER — Other Ambulatory Visit: Payer: Self-pay | Admitting: Nurse Practitioner

## 2021-11-18 MED FILL — Bupropion HCl Tab ER 24HR 150 MG: ORAL | 90 days supply | Qty: 90 | Fill #2 | Status: AC

## 2021-11-19 ENCOUNTER — Other Ambulatory Visit: Payer: Self-pay

## 2021-11-20 ENCOUNTER — Other Ambulatory Visit: Payer: Self-pay

## 2021-11-20 MED ORDER — METOPROLOL SUCCINATE ER 25 MG PO TB24
12.5000 mg | ORAL_TABLET | Freq: Every day | ORAL | 3 refills | Status: DC
  Filled 2021-11-20: qty 45, 90d supply, fill #0
  Filled 2022-02-08: qty 43, 86d supply, fill #0
  Filled 2022-02-10: qty 2, 4d supply, fill #0
  Filled 2022-04-25: qty 45, 90d supply, fill #1

## 2021-11-20 NOTE — Telephone Encounter (Signed)
Requested Prescriptions  Pending Prescriptions Disp Refills  . metoprolol succinate (TOPROL-XL) 25 MG 24 hr tablet 45 tablet 3    Sig: Take 0.5 tablets (12.5 mg total) by mouth daily.     Cardiovascular:  Beta Blockers Passed - 11/18/2021  4:33 PM      Passed - Last BP in normal range    BP Readings from Last 1 Encounters:  09/06/21 98/64         Passed - Last Heart Rate in normal range    Pulse Readings from Last 1 Encounters:  09/06/21 74         Passed - Valid encounter within last 6 months    Recent Outpatient Visits          2 months ago Moderate episode of recurrent major depressive disorder ([place])   [place] Cannady, Jolene T, NP   4 months ago Flank pain   [place], [place] T, NP   6 months ago Moderate episode of recurrent major depressive disorder ([place])   [place] Cannady, Jolene T, NP   7 months ago Upper respiratory tract infection, unspecified type   [place], Lauren A, NP   7 months ago Moderate episode of recurrent major depressive disorder ([place])   [place] Cannady, Barbaraann Faster, NP      Future Appointments            In 2 weeks Cannady, Barbaraann Faster, NP [employer]

## 2021-12-01 NOTE — Patient Instructions (Signed)
Living With Attention Deficit Hyperactivity Disorder If you have been diagnosed with attention deficit hyperactivity disorder (ADHD), you may be relieved that you now know why you have felt or behaved a certain way. Still, you may feel overwhelmed about the treatment ahead. You may also wonder how to get the support you need and how to deal with the condition day-to-day. With treatment and support, you can live with ADHD and manage your symptoms. How to manage lifestyle changes Managing stress Stress is your body's reaction to life changes and events, both good and bad. To cope with the stress of an ADHD diagnosis, it may help to: Learn more about ADHD. Exercise regularly. Even a short daily walk can lower stress levels. Participate in training or education programs (including social skills training classes) that teach you to deal with symptoms.  Medicines Your health care provider may suggest certain medicines if he or she feels that they will help to improve your condition. Stimulant medicines are usually prescribed to treat ADHD, and therapy may also be prescribed. It is important to: Avoid using alcohol and other substances that may prevent your medicines from working properly (may interact). Talk with your pharmacist or health care provider about all the medicines that you take, their possible side effects, and what medicines are safe to take together. Make it your goal to take part in all treatment decisions (shared decision-making). Ask about possible side effects of medicines that your health care provider recommends, and tell him or her how you feel about having those side effects. It is best if shared decision-making with your health care provider is part of your total treatment plan. Relationships To strengthen your relationships with family members while treating your condition, consider taking part in family therapy. You might also attend self-help groups alone or with a loved one. Be  honest about how your symptoms affect your relationships. Make an effort to communicate respectfully instead of fighting, and find ways to show others that you care. Psychotherapy may be useful in helping you cope with how ADHD affects your relationships. How to recognize changes in your condition The following signs may mean that your treatment is working well and your condition is improving: Consistently being on time for appointments. Being more organized at home and work. Other people noticing improvements in your behavior. Achieving goals that you set for yourself. Thinking more clearly. The following signs may mean that your treatment is not working very well: Feeling impatience or more confusion. Missing, forgetting, or being late for appointments. An increasing sense of disorganization and messiness. More difficulty in reaching goals that you set for yourself. Loved ones becoming angry or frustrated with you. Follow these instructions at home: Take over-the-counter and prescription medicines only as told by your health care provider. Check with your health care provider before taking any new medicines. Create structure and an organized atmosphere at home. For example: Make a list of tasks, then rank them from most important to least important. Work on one task at a time until your listed tasks are done. Make a daily schedule and follow it consistently every day. Use an appointment calendar, and check it 2 or 3 times a day to keep on track. Keep it with you when you leave the house. Create spaces where you keep certain things, and always put things back in their places after you use them. Keep all follow-up visits as told by your health care provider. This is important. Where to find support Talking to others    Keep emotion out of important discussions and speak in a calm, logical way. Listen closely and patiently to your loved ones. Try to understand their point of view, and try to  avoid getting defensive. Take responsibility for the consequences of your actions. Ask that others do not take your behaviors personally. Aim to solve problems as they come up, and express your feelings instead of bottling them up. Talk openly about what you need from your loved ones and how they can support you. Consider going to family therapy sessions or having your family meet with a specialist who deals with ADHD-related behavior problems. Finances Not all insurance plans cover mental health care, so it is important to check with your insurance carrier. If paying for co-pays or counseling services is a problem, search for a local or county mental health care center. Public mental health care services may be offered there at a low cost or no cost when you are not able to see a private health care provider. If you are taking medicine for ADHD, you may be able to get the generic form, which may be less expensive than brand-name medicine. Some makers of prescription medicines also offer help to patients who cannot afford the medicines that they need. Questions to ask your health care provider: What are the risks and benefits of taking medicines? Would I benefit from therapy? How often should I follow up with a health care provider? Contact a health care provider if: You have side effects from your medicines, such as: Repeated muscle twitches, coughing, or speech outbursts. Sleep problems. Loss of appetite. Depression. New or worsening behavior problems. Dizziness. Unusually fast heartbeat. Stomach pains. Headaches. Get help right away if: You have a severe reaction to a medicine. Your behavior suddenly gets worse. Summary With treatment and support, you can live with ADHD and manage your symptoms. The medicines that are most often prescribed for ADHD are stimulants. Consider taking part in family therapy or self-help groups with family members or friends. When you talk with friends  and family about your ADHD, be patient and communicate openly. Take over-the-counter and prescription medicines only as told by your health care provider. Check with your health care provider before taking any new medicines. This information is not intended to replace advice given to you by your health care provider. Make sure you discuss any questions you have with your health care provider. Document Revised: 10/21/2019 Document Reviewed: 11/23/2019 Elsevier Patient Education  2023 [employer]vier Inc.  

## 2021-12-06 ENCOUNTER — Telehealth (INDEPENDENT_AMBULATORY_CARE_PROVIDER_SITE_OTHER): Payer: No Typology Code available for payment source | Admitting: Nurse Practitioner

## 2021-12-06 ENCOUNTER — Other Ambulatory Visit: Payer: Self-pay

## 2021-12-06 ENCOUNTER — Encounter: Payer: Self-pay | Admitting: Nurse Practitioner

## 2021-12-06 DIAGNOSIS — F902 Attention-deficit hyperactivity disorder, combined type: Secondary | ICD-10-CM

## 2021-12-06 DIAGNOSIS — F331 Major depressive disorder, recurrent, moderate: Secondary | ICD-10-CM

## 2021-12-06 DIAGNOSIS — F419 Anxiety disorder, unspecified: Secondary | ICD-10-CM

## 2021-12-06 MED ORDER — LISDEXAMFETAMINE DIMESYLATE 20 MG PO CAPS
20.0000 mg | ORAL_CAPSULE | Freq: Every day | ORAL | 0 refills | Status: DC
  Filled 2022-03-19: qty 30, 30d supply, fill #0

## 2021-12-06 MED ORDER — LISDEXAMFETAMINE DIMESYLATE 20 MG PO CAPS
20.0000 mg | ORAL_CAPSULE | Freq: Every day | ORAL | 0 refills | Status: DC
  Filled 2022-02-09: qty 30, 30d supply, fill #0

## 2021-12-06 MED ORDER — LISDEXAMFETAMINE DIMESYLATE 20 MG PO CAPS
20.0000 mg | ORAL_CAPSULE | Freq: Every day | ORAL | 0 refills | Status: DC
  Filled 2021-12-24: qty 30, 30d supply, fill #0

## 2021-12-06 NOTE — Assessment & Plan Note (Signed)
Diagnosed at [facility]. At this time continue Wellbutrin 150 MG XL daily as is offering benefit and she is fearful of stopping due to this, however made her aware of possibility for interactions with Vyvanse and her Metoprolol while on Wellbutrin.  Refills on Vyvanse sent.  She is aware of office rules on this and agreeable to yearly UDS and controlled subs agreement.  Both are up to date.  Return in 3 months.

## 2021-12-06 NOTE — Assessment & Plan Note (Signed)
Chronic, ongoing. At this time continue Wellbutrin 150 MG XL daily as is offering benefit, however made her aware of possibility for interactions with Vyvanse and her Metoprolol -- if these present I recommend stopping Wellbutrin and switching to an alternative such as SSRI or SNRI.  Refills up to date.  She denies SI/HI.  May also benefit from therapy on regular basis.   

## 2021-12-06 NOTE — Assessment & Plan Note (Signed)
Refer to depression plan of care. 

## 2021-12-06 NOTE — Progress Notes (Signed)
LVM asking patient to call back to schedule follow up appt 

## 2021-12-06 NOTE — Progress Notes (Signed)
LMP 04/27/2020    Subjective:    Patient ID: Jessica Cross, female    DOB: 1990-12-07, 31 y.o.   MRN: [id number]  HPI: Jessica Cross is a 31 y.o. female  Chief Complaint  Patient presents with   ADHD    Patient denies having any concerns at today's visit.   Mood   This visit was completed via telephone due to the restrictions of the COVID-19 pandemic. All issues as above were discussed and addressed but no physical exam was performed. If it was felt that the patient should be evaluated in the office, they were directed there. The patient verbally consented to this visit. Patient was unable to complete an audio/visual visit due to Lack of equipment. Due to the catastrophic nature of the COVID-19 pandemic, this visit was done through audio contact only. Location of the patient: home Location of the provider: work Those involved with this call:  Provider: Marnee Guarneri, DNP CMA: Irena Reichmann, Shelly Desk/Registration: [employer]  Time spent on call:  21 minutes on the phone discussing health concerns. 15 minutes total spent in review of patient's record and preparation of their chart.  I verified patient identity using two factors (patient name and date of birth). Patient consents verbally to being seen via telemedicine visit today.    ADHD FOLLOW UP Was diagnosed by [facility], in November and started on Vyvanse 20 MG daily and this is working well (PCP has taken over refills on this).  Last fill on 11/19/21.  She continues Wellbutrin as well for mood, with benefit. ADHD status: stable Satisfied with current therapy: yes Medication compliance:  good compliance Controlled substance contract: yes Previous psychiatry evaluation: yes Previous medications: Vyvanse   Taking meds on weekends/vacations: no Work/school performance:  good Difficulty sustaining attention/completing tasks:  a little bit Distracted by extraneous stimuli: no Does not listen when  spoken to:  a little   Fidgets with hands or feet: no Unable to stay in seat: no Blurts out/interrupts others: no ADHD Medication Side Effects: yes    Decreased appetite: at times and has some reflux    Headache: no    Sleeping disturbance pattern: no    Irritability: no    Rebound effects (worse than baseline) off medication: no    Anxiousness: no    Dizziness: no    Tics: no     12/06/2021   10:53 AM 09/06/2021   10:46 AM 07/12/2021    2:05 PM 06/14/2021    9:19 AM 05/10/2021    3:00 PM  Depression screen PHQ 2/9  Decreased Interest '1 1 1 1 1  '$ Down, Depressed, Hopeless '1 1 1 1 '$ 0  PHQ - 2 Score '2 2 2 2 1  '$ Altered sleeping '2 2 2 1 1  '$ Tired, decreased energy '2 1 1 1 2  '$ Change in appetite 0 0 0 0 1  Feeling bad or failure about yourself  1 0 0 0 0  Trouble concentrating '2 2 1 2 2  '$ Moving slowly or fidgety/restless 0 0 0 0 0  Suicidal thoughts 0 0 0 0 0  PHQ-9 Score '9 7 6 6 7  '$ Difficult doing work/chores Somewhat difficult  Not difficult at all Not difficult at all Not difficult at all       12/06/2021   10:54 AM 09/06/2021   10:46 AM 07/12/2021    2:05 PM 06/14/2021    9:19 AM  GAD 7 : Generalized Anxiety Score  Nervous, Anxious, on Edge '2 1 1 2  '$ Control/stop worrying '2 2 2 2  '$ Worry too much - different things '2 2 2 2  '$ Trouble relaxing 1 1 0 1  Restless 1 1 0 1  Easily annoyed or irritable '1 1 1 1  '$ Afraid - awful might happen '1 2 2 1  '$ Total GAD 7 Score '10 10 8 10  '$ Anxiety Difficulty Somewhat difficult Somewhat difficult Somewhat difficult Somewhat difficult      Relevant past medical, surgical, family and social history reviewed and updated as indicated. Interim medical history since our last visit reviewed. Allergies and medications reviewed and updated.  Review of Systems  Constitutional:  Negative for activity change, appetite change, chills, fatigue and fever.  Respiratory:  Negative for cough, chest tightness and wheezing.   Gastrointestinal: Negative.    Neurological: Negative.   Psychiatric/Behavioral: Negative.      Per HPI unless specifically indicated above     Objective:    LMP 04/27/2020   Wt Readings from Last 3 Encounters:  09/06/21 148 lb (67.1 kg)  07/12/21 152 lb (68.9 kg)  06/14/21 151 lb 11.2 oz (68.8 kg)    Physical Exam  Telephone only due to technical difficulties.  Results for orders placed or performed in visit on 09/06/21  [id number] 11+Oxyco+Alc+Crt-Bund  Result Value Ref Range   Ethanol Negative Cutoff=0.020 %   Amphetamines, Urine Negative Cutoff=1000 ng/mL   Barbiturate Negative Cutoff=200 ng/mL   BENZODIAZ UR QL Negative Cutoff=200 ng/mL   Cannabinoid Quant, Ur Negative Cutoff=50 ng/mL   Cocaine (Metabolite) Negative Cutoff=300 ng/mL   OPIATE SCREEN URINE Negative Cutoff=300 ng/mL   Oxycodone/Oxymorphone, Urine Negative Cutoff=300 ng/mL   Phencyclidine Negative Cutoff=25 ng/mL   Methadone Screen, Urine Negative Cutoff=300 ng/mL   Propoxyphene Negative Cutoff=300 ng/mL   Meperidine Negative Cutoff=200 ng/mL   Tramadol Negative Cutoff=200 ng/mL   Creatinine 203.9 20.0 - 300.0 mg/dL   pH, Urine 5.6 4.5 - 8.9      Assessment & Plan:   Problem List Items Addressed This Visit       Other   ADHD (attention deficit hyperactivity disorder)    Diagnosed at [facility]. At this time continue Wellbutrin 150 MG XL daily as is offering benefit and she is fearful of stopping due to this, however made her aware of possibility for interactions with Vyvanse and her Metoprolol while on Wellbutrin.  Refills on Vyvanse sent.  She is aware of office rules on this and agreeable to yearly UDS and controlled subs agreement.  Both are up to date.  Return in 3 months.      Anxiety    Refer to depression plan of care.      Depression - Primary    Chronic, ongoing. At this time continue Wellbutrin 150 MG XL daily as is offering benefit, however made her aware of possibility for interactions with  Vyvanse and her Metoprolol -- if these present I recommend stopping Wellbutrin and switching to an alternative such as SSRI or SNRI.  Refills up to date.  She denies SI/HI.  May also benefit from therapy on regular basis.          Follow up plan: Return in about 3 months (around 03/08/2022) for ADHD, MOOD.

## 2021-12-09 NOTE — Progress Notes (Signed)
2nd attempt to reach patient by phone

## 2021-12-10 NOTE — Progress Notes (Signed)
3rd attempt to reach patient by phone 

## 2021-12-12 ENCOUNTER — Other Ambulatory Visit: Payer: Self-pay

## 2021-12-25 ENCOUNTER — Other Ambulatory Visit: Payer: Self-pay

## 2022-01-15 ENCOUNTER — Ambulatory Visit (INDEPENDENT_AMBULATORY_CARE_PROVIDER_SITE_OTHER): Payer: No Typology Code available for payment source | Admitting: Nurse Practitioner

## 2022-01-15 ENCOUNTER — Other Ambulatory Visit: Payer: Self-pay

## 2022-01-15 ENCOUNTER — Encounter: Payer: Self-pay | Admitting: Nurse Practitioner

## 2022-01-15 VITALS — BP 105/69 | HR 67 | Temp 98.7°F | Ht 60.0 in | Wt 144.9 lb

## 2022-01-15 DIAGNOSIS — R591 Generalized enlarged lymph nodes: Secondary | ICD-10-CM | POA: Diagnosis not present

## 2022-01-15 DIAGNOSIS — R21 Rash and other nonspecific skin eruption: Secondary | ICD-10-CM | POA: Insufficient documentation

## 2022-01-15 MED ORDER — VALACYCLOVIR HCL 1 G PO TABS
1000.0000 mg | ORAL_TABLET | Freq: Three times a day (TID) | ORAL | 0 refills | Status: AC
  Filled 2022-01-15: qty 21, 7d supply, fill #0

## 2022-01-15 MED ORDER — TRIAMCINOLONE ACETONIDE 0.1 % EX CREA
1.0000 | TOPICAL_CREAM | Freq: Two times a day (BID) | CUTANEOUS | 0 refills | Status: DC
  Filled 2022-01-15: qty 30, 10d supply, fill #0

## 2022-01-15 NOTE — Assessment & Plan Note (Signed)
Initial shingles like in presentation, just started.  Will start Valtrex 1000 MG TID for 7 days and send in Triamcinolone cream.  Recommend not to touch area and wash hands well after if area is touched.  Monitor and if worsening notify provider.

## 2022-01-15 NOTE — Progress Notes (Signed)
BP 105/69   Pulse 67   Temp 98.7 F (37.1 C) (Oral)   Ht 5' (1.524 m)   Wt 144 lb 14.4 oz (65.7 kg)   LMP 04/27/2020   SpO2 96%   BMI 28.30 kg/m    Subjective:    Patient ID: Jessica Cross, female    DOB: 04/01/1991, 31 y.o.   MRN: [id number]  HPI: Jessica Cross is a 31 y.o. female  Chief Complaint  Patient presents with   Mass    Patient has a lump on her forehead and lump by her ear. Patient says she noticed Monday that she has a knot near her left ear. Patient says today she noticed a lump on the right side of her forehead. Patient says she has one lump that appeared years ago, and had previous scans and was told it was nothing and went away. But she now notices it is enlarged again.  Patient says the areas are tender to touch.   Rash    Patient says on her way here she noticed a rash on the front of her neck. Patient says it itches every now and then.    SKIN MASSES She reports there is a lump on her forehead and a lump in front of left ear and behind right ear.  Has had one behind right ear before with right parotid nodule noted on CT in 2019.  She reports right ear went away for awhile and then popped back up and other ones recently popped up.  No recent illnesses.  Did get stung by insect recently.  Denies fever or night sweats.  No recent weight loss. Duration: days Location: as above History of trauma in area: no -- no pain Redness: no Swelling: yes Oozing: no Pus: no Fevers: no Nausea/vomiting: no Status: stable Treatments attempted:none   RASH Presented today on neck, frontal aspect. Duration:  days  Location: front of neck  Itching: yes Burning: no Redness: yes Oozing: no Scaling: no Blisters: no Painful: no Fevers: no Change in detergents/soaps/personal care products: no Recent illness: no Recent travel:yes, not overseas History of same: no Context: stable Alleviating factors: lotion/moisturizer Treatments  attempted:lotion/moisturizer Shortness of breath: no  Throat/tongue swelling: no Myalgias/arthralgias: no   Relevant past medical, surgical, family and social history reviewed and updated as indicated. Interim medical history since our last visit reviewed. Allergies and medications reviewed and updated.  Review of Systems  Constitutional:  Negative for activity change, appetite change, diaphoresis, fatigue and fever.  Respiratory:  Negative for cough, chest tightness, shortness of breath and wheezing.   Cardiovascular:  Negative for chest pain, palpitations and leg swelling.  Skin:  Positive for rash.  Neurological: Negative.   Psychiatric/Behavioral: Negative.      Per HPI unless specifically indicated above     Objective:    BP 105/69   Pulse 67   Temp 98.7 F (37.1 C) (Oral)   Ht 5' (1.524 m)   Wt 144 lb 14.4 oz (65.7 kg)   LMP 04/27/2020   SpO2 96%   BMI 28.30 kg/m   Wt Readings from Last 3 Encounters:  01/15/22 144 lb 14.4 oz (65.7 kg)  09/06/21 148 lb (67.1 kg)  07/12/21 152 lb (68.9 kg)    Physical Exam Vitals and nursing note reviewed.  Constitutional:      General: She is awake. She is not in acute distress.    Appearance: She is well-developed, well-groomed and overweight. She is not ill-appearing or toxic-appearing.  HENT:     Head: Normocephalic.      Right Ear: Hearing normal.     Left Ear: Hearing normal.     Nose: Nose normal.     Mouth/Throat:     Mouth: Mucous membranes are moist.  Eyes:     General: Lids are normal.        Right eye: No discharge.        Left eye: No discharge.     Conjunctiva/sclera: Conjunctivae normal.     Pupils: Pupils are equal, round, and reactive to light.  Neck:     Thyroid: No thyromegaly.     Vascular: No carotid bruit.  Cardiovascular:     Rate and Rhythm: Normal rate and regular rhythm.     Heart sounds: Normal heart sounds. No murmur heard.    No gallop.  Pulmonary:     Effort: Pulmonary effort is normal.  No accessory muscle usage or respiratory distress.     Breath sounds: Normal breath sounds.  Abdominal:     General: Bowel sounds are normal.     Palpations: Abdomen is soft.  Musculoskeletal:     Cervical back: Normal range of motion and neck supple.     Right lower leg: No edema.     Left lower leg: No edema.  Lymphadenopathy:     Head:     Right side of head: Tonsillar and posterior auricular adenopathy present. No submental, submandibular or preauricular adenopathy.     Left side of head: Preauricular adenopathy present. No submental, submandibular, tonsillar or posterior auricular adenopathy.     Cervical: No cervical adenopathy.  Skin:    General: Skin is warm and dry.     Findings: Rash present.     Comments: Rash present to anterior neck, cluster-like with very small clear vesicles present.  Mild erythema.  Neurological:     Mental Status: She is alert and oriented to person, place, and time.  Psychiatric:        Attention and Perception: Attention normal.        Mood and Affect: Mood normal.        Speech: Speech normal.        Behavior: Behavior normal. Behavior is cooperative.        Thought Content: Thought content normal.    Results for orders placed or performed in visit on 09/06/21  [id number] 11+Oxyco+Alc+Crt-Bund  Result Value Ref Range   Ethanol Negative Cutoff=0.020 %   Amphetamines, Urine Negative Cutoff=1000 ng/mL   Barbiturate Negative Cutoff=200 ng/mL   BENZODIAZ UR QL Negative Cutoff=200 ng/mL   Cannabinoid Quant, Ur Negative Cutoff=50 ng/mL   Cocaine (Metabolite) Negative Cutoff=300 ng/mL   OPIATE SCREEN URINE Negative Cutoff=300 ng/mL   Oxycodone/Oxymorphone, Urine Negative Cutoff=300 ng/mL   Phencyclidine Negative Cutoff=25 ng/mL   Methadone Screen, Urine Negative Cutoff=300 ng/mL   Propoxyphene Negative Cutoff=300 ng/mL   Meperidine Negative Cutoff=200 ng/mL   Tramadol Negative Cutoff=200 ng/mL   Creatinine 203.9 20.0 - 300.0 mg/dL   pH, Urine 5.6  4.5 - 8.9      Assessment & Plan:   Problem List Items Addressed This Visit       Musculoskeletal and Integument   Rash    Initial shingles like in presentation, just started.  Will start Valtrex 1000 MG TID for 7 days and send in Triamcinolone cream.  Recommend not to touch area and wash hands well after if area is touched.  Monitor and if worsening notify provider.  Immune and Lymphatic   Lymphadenopathy - Primary    Multiple areas noted, ?related to illness or current shingles-like rash being present.  Will obtain imaging and treat shingles.  Has had similar in 2019 she reports with parotid nodule noted on CT.  No red flag symptoms at this time.  Determine next steps after imaging returns.  Labs today: CBC and CMP.      Relevant Orders   CBC with Differential/Platelet   Basic Metabolic Panel (BMET)   US Soft Tissue Head/Neck (NON-THYROID)     Follow up plan: Return in about 3 weeks (around 02/05/2022).

## 2022-01-15 NOTE — Patient Instructions (Signed)
Lymphadenopathy  Lymphadenopathy means that your lymph glands are swollen or larger than normal. Lymph glands, also called lymph nodes, are collections of tissue that filter excess fluid, bacteria, viruses, and waste from your bloodstream. They are part of your body's disease-fighting system (immune system), which protects your body from germs. There may be different causes of lymphadenopathy, depending on where it is in your body. Some types go away on their own. Lymphadenopathy can occur anywhere that you have lymph glands, including these areas: Neck (cervical lymphadenopathy). Chest (mediastinal lymphadenopathy). Lungs (hilar lymphadenopathy). Underarms (axillary lymphadenopathy). Groin (inguinal lymphadenopathy). When your immune system responds to germs, infection-fighting cells and fluid build up in your lymph glands. This causes some swelling and enlargement. If the lymph nodes do not go back to normal size after you have an infection or disease, your health care provider may do tests. These tests help to monitor your condition and find the reason why the glands are still swollen and enlarged. Follow these instructions at home:  Get plenty of rest. Your health care provider may recommend over-the-counter medicines for pain. Take over-the-counter and prescription medicines only as told by your health care provider. If directed, apply heat to swollen lymph glands as often as told by your health care provider. Use the heat source that your health care provider recommends, such as a moist heat pack or a heating pad. Place a towel between your skin and the heat source. Leave the heat on for 20-30 minutes. Remove the heat if your skin turns bright red. This is especially important if you are unable to feel pain, heat, or cold. You may have a greater risk of getting burned. Check your affected lymph glands every day for changes. Check other lymph gland areas as told by your health care provider.  Check for changes such as: More swelling. Sudden increase in size. Redness or pain. Hardness. Keep all follow-up visits. This is important. Contact a health care provider if you have: Lymph glands that: Are still swollen after 2 weeks. Have suddenly gotten bigger or the swelling spreads. Are red, painful, or hard. Fluid leaking from the skin near an enlarged lymph gland. Problems with breathing. A fever, chills, or night sweats. Fatigue. A sore throat. Pain in your abdomen. Weight loss. Get help right away if you have: Severe pain. Chest pain. Shortness of breath. These symptoms may represent a serious problem that is an emergency. Do not wait to see if the symptoms will go away. Get medical help right away. Call your local emergency services (911 in the [place] Do not drive yourself to the hospital. Summary Lymphadenopathy means that your lymph glands are swollen or larger than normal. Lymph glands, also called lymph nodes, are collections of tissue that filter excess fluid, bacteria, viruses, and waste from the bloodstream. They are part of your body's disease-fighting system (immune system). Lymphadenopathy can occur anywhere that you have lymph glands. If the lymph nodes do not go back to normal size after you have an infection or disease, your health care provider may do tests to monitor your condition and find the reason why the glands are still swollen and enlarged. Check your affected lymph glands every day for changes. Check other lymph gland areas as told by your health care provider. This information is not intended to replace advice given to you by your health care provider. Make sure you discuss any questions you have with your health care provider. Document Revised: 04/04/2020 Document Reviewed: 04/04/2020 Elsevier Patient Education  2023 [employer]vier Inc.  

## 2022-01-15 NOTE — Assessment & Plan Note (Signed)
Multiple areas noted, ?related to illness or current shingles-like rash being present.  Will obtain imaging and treat shingles.  Has had similar in 2019 she reports with parotid nodule noted on CT.  No red flag symptoms at this time.  Determine next steps after imaging returns.  Labs today: CBC and CMP.

## 2022-01-16 ENCOUNTER — Encounter: Payer: Self-pay | Admitting: Nurse Practitioner

## 2022-01-16 LAB — CBC WITH DIFFERENTIAL/PLATELET
Basophils Absolute: 0 10*3/uL (ref 0.0–0.2)
Basos: 0 %
EOS (ABSOLUTE): 0.2 10*3/uL (ref 0.0–0.4)
Eos: 3 %
Hematocrit: 42.2 % (ref 34.0–46.6)
Hemoglobin: 14.4 g/dL (ref 11.1–15.9)
Immature Grans (Abs): 0 10*3/uL (ref 0.0–0.1)
Immature Granulocytes: 0 %
Lymphocytes Absolute: 1.5 10*3/uL (ref 0.7–3.1)
Lymphs: 29 %
MCH: 31.2 pg (ref 26.6–33.0)
MCHC: 34.1 g/dL (ref 31.5–35.7)
MCV: 92 fL (ref 79–97)
Monocytes Absolute: 0.4 10*3/uL (ref 0.1–0.9)
Monocytes: 9 %
Neutrophils Absolute: 3 10*3/uL (ref 1.4–7.0)
Neutrophils: 59 %
Platelets: 258 10*3/uL (ref 150–450)
RBC: 4.61 x10E6/uL (ref 3.77–5.28)
RDW: 11.9 % (ref 11.7–15.4)
WBC: 5 10*3/uL (ref 3.4–10.8)

## 2022-01-16 LAB — BASIC METABOLIC PANEL
BUN/Creatinine Ratio: 15 (ref 9–23)
BUN: 12 mg/dL (ref 6–20)
CO2: 24 mmol/L (ref 20–29)
Calcium: 10.1 mg/dL (ref 8.7–10.2)
Chloride: 100 mmol/L (ref 96–106)
Creatinine, Ser: 0.8 mg/dL (ref 0.57–1.00)
Glucose: 83 mg/dL (ref 70–99)
Potassium: 4.1 mmol/L (ref 3.5–5.2)
Sodium: 137 mmol/L (ref 134–144)
eGFR: 100 mL/min/{1.73_m2} (ref >59)

## 2022-01-16 MED ORDER — PREDNISONE 10 MG PO TABS: ORAL_TABLET | ORAL | 0 refills | Status: DC

## 2022-01-16 NOTE — Progress Notes (Signed)
Contacted via MyChart   Good morning Jessica Cross, all of your labs returned and are nice and normal.:)

## 2022-01-28 ENCOUNTER — Ambulatory Visit
Admission: RE | Admit: 2022-01-28 | Discharge: 2022-01-28 | Disposition: A | Discharge status: Home / Self Care | Payer: No Typology Code available for payment source | Source: Ambulatory Visit | Attending: Nurse Practitioner | Admitting: Nurse Practitioner

## 2022-01-28 DIAGNOSIS — R591 Generalized enlarged lymph nodes: Secondary | ICD-10-CM | POA: Diagnosis present

## 2022-01-29 NOTE — Progress Notes (Signed)
Contacted via MyChart   Good evening Jessica Cross, how are you feeling?  Your imaging has returned.  The ultrasound did show a prominent lymph node to left side behind ear, where we noted one.  This could be reactive, related to a recent infection or other inflammatory response.  I recommend if this continues to be present over next few weeks then we pursue CT scan to evaluate.  Other areas are stable with normal appearing lymph nodes right side and no findings to forehead area.  Any questions? Keep being amazing!!  Thank you for allowing me to participate in your care.  I appreciate you. Kindest regards, Christi Wirick

## 2022-02-01 NOTE — Patient Instructions (Signed)
Lymphadenopathy  Lymphadenopathy means that your lymph glands are swollen or larger than normal. Lymph glands, also called lymph nodes, are collections of tissue that filter excess fluid, bacteria, viruses, and waste from your bloodstream. They are part of your body's disease-fighting system (immune system), which protects your body from germs. There may be different causes of lymphadenopathy, depending on where it is in your body. Some types go away on their own. Lymphadenopathy can occur anywhere that you have lymph glands, including these areas: Neck (cervical lymphadenopathy). Chest (mediastinal lymphadenopathy). Lungs (hilar lymphadenopathy). Underarms (axillary lymphadenopathy). Groin (inguinal lymphadenopathy). When your immune system responds to germs, infection-fighting cells and fluid build up in your lymph glands. This causes some swelling and enlargement. If the lymph nodes do not go back to normal size after you have an infection or disease, your health care provider may do tests. These tests help to monitor your condition and find the reason why the glands are still swollen and enlarged. Follow these instructions at home:  Get plenty of rest. Your health care provider may recommend over-the-counter medicines for pain. Take over-the-counter and prescription medicines only as told by your health care provider. If directed, apply heat to swollen lymph glands as often as told by your health care provider. Use the heat source that your health care provider recommends, such as a moist heat pack or a heating pad. Place a towel between your skin and the heat source. Leave the heat on for 20-30 minutes. Remove the heat if your skin turns bright red. This is especially important if you are unable to feel pain, heat, or cold. You may have a greater risk of getting burned. Check your affected lymph glands every day for changes. Check other lymph gland areas as told by your health care provider.  Check for changes such as: More swelling. Sudden increase in size. Redness or pain. Hardness. Keep all follow-up visits. This is important. Contact a health care provider if you have: Lymph glands that: Are still swollen after 2 weeks. Have suddenly gotten bigger or the swelling spreads. Are red, painful, or hard. Fluid leaking from the skin near an enlarged lymph gland. Problems with breathing. A fever, chills, or night sweats. Fatigue. A sore throat. Pain in your abdomen. Weight loss. Get help right away if you have: Severe pain. Chest pain. Shortness of breath. These symptoms may represent a serious problem that is an emergency. Do not wait to see if the symptoms will go away. Get medical help right away. Call your local emergency services (911 in the [place] Do not drive yourself to the hospital. Summary Lymphadenopathy means that your lymph glands are swollen or larger than normal. Lymph glands, also called lymph nodes, are collections of tissue that filter excess fluid, bacteria, viruses, and waste from the bloodstream. They are part of your body's disease-fighting system (immune system). Lymphadenopathy can occur anywhere that you have lymph glands. If the lymph nodes do not go back to normal size after you have an infection or disease, your health care provider may do tests to monitor your condition and find the reason why the glands are still swollen and enlarged. Check your affected lymph glands every day for changes. Check other lymph gland areas as told by your health care provider. This information is not intended to replace advice given to you by your health care provider. Make sure you discuss any questions you have with your health care provider. Document Revised: 04/04/2020 Document Reviewed: 04/04/2020 Elsevier Patient Education  2023 [employer]vier Inc.  

## 2022-02-04 ENCOUNTER — Other Ambulatory Visit: Payer: Self-pay

## 2022-02-05 ENCOUNTER — Ambulatory Visit (INDEPENDENT_AMBULATORY_CARE_PROVIDER_SITE_OTHER): Payer: No Typology Code available for payment source | Admitting: Nurse Practitioner

## 2022-02-05 ENCOUNTER — Encounter: Payer: Self-pay | Admitting: Nurse Practitioner

## 2022-02-05 DIAGNOSIS — R591 Generalized enlarged lymph nodes: Secondary | ICD-10-CM | POA: Diagnosis not present

## 2022-02-05 NOTE — Assessment & Plan Note (Signed)
Improved at this time after Prednisone taper, no further concerns.  Return to office if symptoms present again and will treat as needed.  Suspect more reactive.

## 2022-02-05 NOTE — Progress Notes (Signed)
BP 116/81   Pulse 74   Temp 98.2 F (36.8 C) (Oral)   Ht 5' (1.524 m)   Wt 145 lb 9.6 oz (66 kg)   LMP 04/27/2020   SpO2 98%   BMI 28.44 kg/m    Subjective:    Patient ID: [name], female    DOB: 05/05/1991, 31 y.o.   MRN: [id number]  HPI: IZZY DOUBEK is a 31 y.o. female  Chief Complaint  Patient presents with   U/S    Follow up on U/S of her neck and head   LYMPHADENOPATHY Presents for follow-up of recent lymphadenopathy, visit 01/15/22.  At time had lump on her forehead and a lump in front of left ear and behind right ear.  Has had one behind right ear before with right parotid nodule noted on CT in 2019.  Provided Prednisone taper.  Imaging performed did notice reactive lymph node to left, remainder all normal in appearance.  She reports at this time is improved -- no further areas noted. Duration: days Location: as above History of trauma in area: no  Redness: no Swelling: yes Oozing: no Pus: no Fevers: no Nausea/vomiting: no Status: improved Treatments attempted: Prednisone  Relevant past medical, surgical, family and social history reviewed and updated as indicated. Interim medical history since our last visit reviewed. Allergies and medications reviewed and updated.  Review of Systems  Constitutional:  Negative for activity change, appetite change, diaphoresis, fatigue and fever.  Respiratory:  Negative for cough, chest tightness, shortness of breath and wheezing.   Cardiovascular:  Negative for chest pain, palpitations and leg swelling.  Neurological: Negative.   Psychiatric/Behavioral: Negative.      Per HPI unless specifically indicated above     Objective:    BP 116/81   Pulse 74   Temp 98.2 F (36.8 C) (Oral)   Ht 5' (1.524 m)   Wt 145 lb 9.6 oz (66 kg)   LMP 04/27/2020   SpO2 98%   BMI 28.44 kg/m   Wt Readings from Last 3 Encounters:  02/05/22 145 lb 9.6 oz (66 kg)  01/15/22 144 lb 14.4 oz (65.7 kg)  09/06/21 148 lb (67.1 kg)     Physical Exam Vitals and nursing note reviewed.  Constitutional:      General: She is awake. She is not in acute distress.    Appearance: She is well-developed, well-groomed and overweight. She is not ill-appearing or toxic-appearing.  HENT:     Head: Normocephalic.      Right Ear: Hearing normal.     Left Ear: Hearing normal.     Nose: Nose normal.     Mouth/Throat:     Mouth: Mucous membranes are moist.  Eyes:     General: Lids are normal.        Right eye: No discharge.        Left eye: No discharge.     Conjunctiva/sclera: Conjunctivae normal.     Pupils: Pupils are equal, round, and reactive to light.  Neck:     Thyroid: No thyromegaly.     Vascular: No carotid bruit.  Cardiovascular:     Rate and Rhythm: Normal rate and regular rhythm.     Heart sounds: Normal heart sounds. No murmur heard.    No gallop.  Pulmonary:     Effort: Pulmonary effort is normal. No accessory muscle usage or respiratory distress.     Breath sounds: Normal breath sounds.  Abdominal:     General:  Bowel sounds are normal.     Palpations: Abdomen is soft.  Musculoskeletal:     Cervical back: Normal range of motion and neck supple.     Right lower leg: No edema.     Left lower leg: No edema.  Lymphadenopathy:     Head:     Right side of head: No submental, submandibular, tonsillar, preauricular or posterior auricular adenopathy.     Left side of head: No submental, submandibular, tonsillar, preauricular or posterior auricular adenopathy.     Cervical: No cervical adenopathy.  Skin:    General: Skin is warm and dry.  Neurological:     Mental Status: She is alert and oriented to person, place, and time.  Psychiatric:        Attention and Perception: Attention normal.        Mood and Affect: Mood normal.        Speech: Speech normal.        Behavior: Behavior normal. Behavior is cooperative.        Thought Content: Thought content normal.    Results for orders placed or performed in  visit on 01/15/22  CBC with Differential/Platelet  Result Value Ref Range   WBC 5.0 3.4 - 10.8 x10E3/uL   RBC 4.61 3.77 - 5.28 x10E6/uL   Hemoglobin 14.4 11.1 - 15.9 g/dL   Hematocrit 42.2 34.0 - 46.6 %   MCV 92 79 - 97 fL   MCH 31.2 26.6 - 33.0 pg   MCHC 34.1 31.5 - 35.7 g/dL   RDW 11.9 11.7 - 15.4 %   Platelets 258 150 - 450 x10E3/uL   Neutrophils 59 Not Estab. %   Lymphs 29 Not Estab. %   Monocytes 9 Not Estab. %   Eos 3 Not Estab. %   Basos 0 Not Estab. %   Neutrophils Absolute 3.0 1.4 - 7.0 x10E3/uL   Lymphocytes Absolute 1.5 0.7 - 3.1 x10E3/uL   Monocytes Absolute 0.4 0.1 - 0.9 x10E3/uL   EOS (ABSOLUTE) 0.2 0.0 - 0.4 x10E3/uL   Basophils Absolute 0.0 0.0 - 0.2 x10E3/uL   Immature Granulocytes 0 Not Estab. %   Immature Grans (Abs) 0.0 0.0 - 0.1 [id number]  Basic Metabolic Panel (BMET)  Result Value Ref Range   Glucose 83 70 - 99 mg/dL   BUN 12 6 - 20 mg/dL   Creatinine, Ser 0.80 0.57 - 1.00 mg/dL   eGFR 100 >59 mL/min/1.73   BUN/Creatinine Ratio 15 9 - 23   Sodium 137 134 - 144 mmol/L   Potassium 4.1 3.5 - 5.2 mmol/L   Chloride 100 96 - 106 mmol/L   CO2 24 20 - 29 mmol/L   Calcium 10.1 8.7 - 10.2 mg/dL      Assessment & Plan:   Problem List Items Addressed This Visit       Immune and Lymphatic   Lymphadenopathy    Improved at this time after Prednisone taper, no further concerns.  Return to office if symptoms present again and will treat as needed.  Suspect more reactive.        Follow up plan: Return if symptoms worsen or fail to improve.

## 2022-02-08 ENCOUNTER — Other Ambulatory Visit: Payer: Self-pay | Admitting: Nurse Practitioner

## 2022-02-08 MED FILL — Bupropion HCl Tab ER 24HR 150 MG: ORAL | 90 days supply | Qty: 90 | Fill #3 | Status: AC

## 2022-02-09 ENCOUNTER — Other Ambulatory Visit: Payer: Self-pay

## 2022-02-10 ENCOUNTER — Other Ambulatory Visit: Payer: Self-pay

## 2022-03-07 ENCOUNTER — Ambulatory Visit: Payer: No Typology Code available for payment source | Admitting: Nurse Practitioner

## 2022-03-19 ENCOUNTER — Other Ambulatory Visit: Payer: Self-pay

## 2022-03-19 MED FILL — Bupropion HCl Tab ER 24HR 150 MG: ORAL | 90 days supply | Qty: 90 | Fill #4 | Status: CN

## 2022-03-21 ENCOUNTER — Ambulatory Visit: Payer: No Typology Code available for payment source | Admitting: Nurse Practitioner

## 2022-03-30 NOTE — Patient Instructions (Signed)
Attention Deficit Hyperactivity Disorder, Adult Attention deficit hyperactivity disorder (ADHD) is a mental health disorder that starts during childhood. For many people with ADHD, the disorder continues into the adult years. Treatment can help you manage your symptoms. There are three main types of ADHD: Inattentive. With this type, adults have difficulty paying attention. This may affect cognitive abilities. Hyperactive-impulsive. With this type, adults have a lot of energy and have difficulty controlling their behavior. Combination type. Some people may have symptoms of both types. What are the causes? The exact cause of ADHD is not known. Most experts believe a person's genes and environment possibly contribute to ADHD. What increases the risk? The following factors may make you more likely to develop this condition: Having a first-degree relative such as a parent, brother, or sister, with the condition. Being born before 37 weeks of pregnancy (prematurely) or at a low birth weight. Being born to a mother who smoked tobacco or drank alcohol during pregnancy. Having experienced a brain injury. Being exposed to lead or other toxins in the womb or early in life. What are the signs or symptoms? Symptoms of this condition depend on the type of ADHD. Symptoms of the inattentive type include: Difficulty paying attention or following instructions. Often making simple mistakes. Being disorganized. Avoiding tasks that require time and attention. Losing and forgetting things. Symptoms of the hyperactive-impulsive type include: Restlessness. Talking out of turn, interrupting others, or talking too much. Difficulty with: Sitting still. Feeling motivated. Relaxing. Waiting in line or waiting for a turn. People with the combination type have symptoms of both of the other types. In adults, this condition may lead to certain problems, such as: Keeping jobs. Performing tasks at work. Having  stable relationships. Being on time or keeping to a schedule. How is this diagnosed? This condition is diagnosed based on your current symptoms and your history of symptoms. The diagnosis can be made by a health care provider such as a primary care provider or a mental health care specialist. Your health care provider may use a symptom checklist or a behavior rating scale to evaluate your symptoms. Your health care provider may also want to talk with people who have observed your behaviors throughout your life. How is this treated? This condition can be treated with medicines and behavior therapy. Medicines may be the best option to reduce impulsive behaviors and improve attention. Your health care provider may recommend: Stimulant medicines. These are the most common medicines used for adult ADHD. They affect certain chemicals in the brain (neurotransmitters) and improve your ability to control your symptoms. A non-stimulant medicine. These medicines can also improve focus, attention, and impulsive behavior. It may take weeks to months to see the effects of this medicine. Counseling and behavioral management are also important for treating ADHD. Counseling is often used along with medicine. Your health care provider may suggest: Cognitive behavioral therapy (CBT). This type of therapy teaches you to replace negative thoughts and actions with positive thoughts and actions. When used as part of ADHD treatment, this therapy may also include: Coping strategies for organization, time management, impulse control, and stress reduction. Mindfulness and meditation training. Behavioral management. You may work with a coach who is specially trained to help people with ADHD manage and organize activities and function more effectively. Follow these instructions at home: Medicines  Take over-the-counter and prescription medicines only as told by your health care provider. Talk with your health care provider  about the possible side effects of your medicines and   how to manage them. Alcohol use Do not drink alcohol if: Your health care provider tells you not to drink. You are pregnant, may be pregnant, or are planning to become pregnant. If you drink alcohol: Limit how much you use to: 0-1 drink a day for women. 0-2 drinks a day for men. Know how much alcohol is in your drink. In the [place] one drink equals one 12 oz bottle of beer (355 mL), one 5 oz glass of wine (148 mL), or one 1 oz glass of hard liquor (44 mL). Lifestyle  Do not use illegal drugs. Get enough sleep. Eat a healthy diet. Exercise regularly. Exercise can help to reduce stress and anxiety. General instructions Learn as much as you can about adult ADHD, and work closely with your health care providers to find the treatments that work best for you. Follow the same schedule each day. Use reminder devices like notes, calendars, and phone apps to stay on time and organized. Keep all follow-up visits. Your health care provider will need to monitor your condition and adjust your treatment over time. Where to find more information A health care provider may be able to recommend resources that are available online or over the phone. You could start with: Attention Deficit Disorder Association (ADDA): add.org National Institute of Mental Health (NIMH): nimh.nih.gov Contact a health care provider if: Your symptoms continue to cause problems. You have side effects from your medicine, such as: Repeated muscle twitches, coughing, or speech outbursts. Sleep problems. Loss of appetite. Dizziness. Unusually fast heartbeat. Stomach pains. Headaches. You are struggling with anxiety, depression, or substance abuse. Get help right away if: You have a severe reaction to a medicine. This symptom may be an emergency. Get help right away. Call 911. Do not wait to see if the symptom will go away. Do not drive yourself to the hospital. Take  one of these steps if you feel like you may hurt yourself or others, or have thoughts about taking your own life: Go to your nearest emergency room. Call 911. Call the National Suicide Prevention Lifeline at 1-800-273-8255 or 988. This is open 24 hours a day Text the Crisis Text Line at 741741. Summary ADHD is a mental health disorder that starts during childhood and often continues into your adult years. The exact cause of ADHD is not known. Most experts believe genetics and environmental factors contribute to ADHD. There is no cure for ADHD, but treatment with medicine, cognitive behavioral therapy, or behavioral management can help you manage your condition. This information is not intended to replace advice given to you by your health care provider. Make sure you discuss any questions you have with your health care provider. Document Revised: 09/27/2021 Document Reviewed: 09/27/2021 Elsevier Patient Education  2023 Elsevier Inc.  

## 2022-04-04 ENCOUNTER — Ambulatory Visit (INDEPENDENT_AMBULATORY_CARE_PROVIDER_SITE_OTHER): Payer: No Typology Code available for payment source | Admitting: Nurse Practitioner

## 2022-04-04 ENCOUNTER — Encounter: Payer: Self-pay | Admitting: Nurse Practitioner

## 2022-04-04 ENCOUNTER — Other Ambulatory Visit: Payer: Self-pay

## 2022-04-04 VITALS — BP 109/76 | HR 73 | Temp 98.3°F | Ht 60.0 in | Wt 145.4 lb

## 2022-04-04 DIAGNOSIS — F419 Anxiety disorder, unspecified: Secondary | ICD-10-CM

## 2022-04-04 DIAGNOSIS — F331 Major depressive disorder, recurrent, moderate: Secondary | ICD-10-CM

## 2022-04-04 DIAGNOSIS — F902 Attention-deficit hyperactivity disorder, combined type: Secondary | ICD-10-CM | POA: Diagnosis not present

## 2022-04-04 DIAGNOSIS — Z23 Encounter for immunization: Secondary | ICD-10-CM | POA: Diagnosis not present

## 2022-04-04 MED ORDER — LISDEXAMFETAMINE DIMESYLATE 30 MG PO CAPS
30.0000 mg | ORAL_CAPSULE | Freq: Every day | ORAL | 0 refills | Status: DC
  Filled 2022-04-25: qty 30, 30d supply, fill #0

## 2022-04-04 MED ORDER — LISDEXAMFETAMINE DIMESYLATE 30 MG PO CAPS
30.0000 mg | ORAL_CAPSULE | Freq: Every day | ORAL | 0 refills | Status: DC
  Filled 2022-06-26: qty 30, 30d supply, fill #0

## 2022-04-04 MED ORDER — LISDEXAMFETAMINE DIMESYLATE 30 MG PO CAPS
30.0000 mg | ORAL_CAPSULE | Freq: Every day | ORAL | 0 refills | Status: DC
  Filled 2022-05-27: qty 30, 30d supply, fill #0

## 2022-04-04 NOTE — Assessment & Plan Note (Signed)
Chronic, ongoing. At this time continue Wellbutrin 150 MG XL daily as is offering benefit, however made her aware of possibility for interactions with Vyvanse and her Metoprolol -- if these present I recommend stopping Wellbutrin and switching to an alternative such as SSRI or SNRI.  Refills up to date.  She denies SI/HI.  May also benefit from therapy on regular basis.

## 2022-04-04 NOTE — Assessment & Plan Note (Signed)
Diagnosed at [facility] in November 2022. At this time continue Wellbutrin 150 MG XL daily as is offering benefit and she is fearful of stopping due to this.  Increase Vyvanse to 30 MG daily to see if more benefit for ADHD.  Refills on Vyvanse sent.  She is aware of office rules on this and agreeable to yearly UDS and controlled subs agreement.  Both are up to date.  Return in 3 months.

## 2022-04-04 NOTE — Progress Notes (Signed)
BP 109/76   Pulse 73   Temp 98.3 F (36.8 C) (Oral)   Ht 5' (1.524 m)   Wt 145 lb 6.4 oz (66 kg)   LMP 04/27/2020   SpO2 98%   BMI 28.40 kg/m    Subjective:    Patient ID: Jessica Cross, female    DOB: 07-29-1990, 31 y.o.   MRN: [id number]  HPI: Jessica Cross is a 31 y.o. female  Chief Complaint  Patient presents with   Medication Management    Patient is here for follow up on Vyvanse prescription. Patient says she is now working around 13 hours a day. Patient is requesting to discuss a possible dose change.    ADHD FOLLOW UP Was diagnosed by [facility], in November 2022 and started on Vyvanse 20 MG daily.  Last fill on 03/19/22.  She continues Wellbutrin as well for mood, which she notices benefit.    At this time is working 12 to 13 hours as picked up new job.  Is wanting to know if increase in Vyvanse may help her focus, as loses focus in afternoon.  ADHD status: ongoing Satisfied with current therapy: yes Medication compliance:  good compliance Controlled substance contract: yes Previous psychiatry evaluation: yes Previous medications: Vyvanse   Taking meds on weekends/vacations: no Work/school performance:  good Difficulty sustaining attention/completing tasks:  a little bit around 5 pm Distracted by extraneous stimuli: no Does not listen when spoken to:  a little   Fidgets with hands or feet: yes Unable to stay in seat: yes Blurts out/interrupts others: no ADHD Medication Side Effects: yes    Decreased appetite: no -- is taking Pepcid which helps with reflux    Headache: no    Sleeping disturbance pattern: no    Irritability: no    Rebound effects (worse than baseline) off medication: no    Anxiousness: no    Dizziness: no    Tics: no     04/04/2022    3:31 PM 02/05/2022    2:56 PM 01/15/2022    3:09 PM 12/06/2021   10:53 AM 09/06/2021   10:46 AM  Depression screen PHQ 2/9  Decreased Interest _0 Down, Depressed, Hopeless _1 PHQ - 2 Score _2 Altered sleeping _3 Tired, decreased energy _4 Change in appetite 1 0 0 0 0  Feeling bad or failure about yourself  _5 0  Trouble concentrating _6 Moving slowly or fidgety/restless 0 0 0 0 0  Suicidal thoughts 0 0 0 0 0  PHQ-9 Score _7 Difficult doing work/chores Somewhat difficult Somewhat difficult Somewhat difficult Somewhat difficult        04/04/2022    3:31 PM 02/05/2022    2:56 PM 01/15/2022    3:09 PM 12/06/2021   10:54 AM  GAD 7 : Generalized Anxiety Score  Nervous, Anxious, on Edge _8 Control/stop worrying _9 Worry too much - different things _10 Trouble relaxing _11 Restless _12 Easily annoyed or irritable _13 Afraid - awful might happen _14 Total GAD 7 Score _15 Anxiety Difficulty  Somewhat difficult Somewhat difficult Somewhat difficult  Relevant past medical, surgical, family and social history reviewed and updated as indicated. Interim medical history since our last visit reviewed. Allergies and medications reviewed and updated.  Review of Systems  Constitutional:  Negative for activity change, appetite change, chills, fatigue and fever.  Respiratory:  Negative for cough, chest tightness and wheezing.   Gastrointestinal: Negative.   Neurological: Negative.   Psychiatric/Behavioral: Negative.      Per HPI unless specifically indicated above     Objective:    BP 109/76   Pulse 73   Temp 98.3 F (36.8 C) (Oral)   Ht 5' (1.524 m)   Wt 145 lb 6.4 oz (66 kg)   LMP 04/27/2020   SpO2 98%   BMI 28.40 kg/m   Wt Readings from Last 3 Encounters:  04/04/22 145 lb 6.4 oz (66 kg)  02/05/22 145 lb 9.6 oz (66 kg)  01/15/22 144 lb 14.4 oz (65.7 kg)    Physical Exam Vitals and nursing note reviewed.  Constitutional:      General: She is awake. She is not in acute distress.    Appearance: She is well-developed and well-groomed. She is not  ill-appearing.  HENT:     Head: Normocephalic.     Right Ear: Hearing normal.     Left Ear: Hearing normal.  Eyes:     General: Lids are normal.        Right eye: No discharge.        Left eye: No discharge.     Conjunctiva/sclera: Conjunctivae normal.     Pupils: Pupils are equal, round, and reactive to light.  Neck:     Thyroid: No thyromegaly.     Vascular: No carotid bruit or JVD.  Cardiovascular:     Rate and Rhythm: Normal rate and regular rhythm.     Heart sounds: Normal heart sounds. No murmur heard.    No gallop.  Pulmonary:     Effort: Pulmonary effort is normal. No accessory muscle usage or respiratory distress.     Breath sounds: Normal breath sounds.  Abdominal:     General: Bowel sounds are normal.     Palpations: Abdomen is soft.  Musculoskeletal:     Cervical back: Normal range of motion and neck supple.     Right lower leg: No edema.     Left lower leg: No edema.  Lymphadenopathy:     Cervical: No cervical adenopathy.  Skin:    General: Skin is warm and dry.  Neurological:     Mental Status: She is alert and oriented to person, place, and time.  Psychiatric:        Attention and Perception: Attention normal.        Mood and Affect: Mood normal.        Speech: Speech normal.        Behavior: Behavior normal. Behavior is cooperative.        Thought Content: Thought content normal.     Results for orders placed or performed in visit on 01/15/22  CBC with Differential/Platelet  Result Value Ref Range   WBC 5.0 3.4 - 10.8 x10E3/uL   RBC 4.61 3.77 - 5.28 x10E6/uL   Hemoglobin 14.4 11.1 - 15.9 g/dL   Hematocrit 42.2 34.0 - 46.6 %   MCV 92 79 - 97 fL   MCH 31.2 26.6 - 33.0 pg   MCHC 34.1 31.5 - 35.7 g/dL   RDW 11.9 11.7 - 15.4 %   Platelets 258 150 - 450 x10E3/uL  Neutrophils 59 Not Estab. %   Lymphs 29 Not Estab. %   Monocytes 9 Not Estab. %   Eos 3 Not Estab. %   Basos 0 Not Estab. %   Neutrophils Absolute 3.0 1.4 - 7.0 x10E3/uL   Lymphocytes  Absolute 1.5 0.7 - 3.1 x10E3/uL   Monocytes Absolute 0.4 0.1 - 0.9 x10E3/uL   EOS (ABSOLUTE) 0.2 0.0 - 0.4 x10E3/uL   Basophils Absolute 0.0 0.0 - 0.2 x10E3/uL   Immature Granulocytes 0 Not Estab. %   Immature Grans (Abs) 0.0 0.0 - 0.1 [id number]/LD  Basic Metabolic Panel (BMET)  Result Value Ref Range   Glucose 83 70 - 99 mg/dL   BUN 12 6 - 20 mg/dL   Creatinine, Ser 0.80 0.57 - 1.00 mg/dL   eGFR 100 >59 mL/min/1.73   BUN/Creatinine Ratio 15 9 - 23   Sodium 137 134 - 144 mmol/L   Potassium 4.1 3.5 - 5.2 mmol/L   Chloride 100 96 - 106 mmol/L   CO2 24 20 - 29 mmol/L   Calcium 10.1 8.7 - 10.2 mg/dL      Assessment & Plan:   Problem List Items Addressed This Visit       Other   ADHD (attention deficit hyperactivity disorder)    Diagnosed at [facility] in November 2022. At this time continue Wellbutrin 150 MG XL daily as is offering benefit and she is fearful of stopping due to this.  Increase Vyvanse to 30 MG daily to see if more benefit for ADHD.  Refills on Vyvanse sent.  She is aware of office rules on this and agreeable to yearly UDS and controlled subs agreement.  Both are up to date.  Return in 3 months.      Anxiety    Refer to depression plan of care.      Depression - Primary    Chronic, ongoing. At this time continue Wellbutrin 150 MG XL daily as is offering benefit, however made her aware of possibility for interactions with Vyvanse and her Metoprolol -- if these present I recommend stopping Wellbutrin and switching to an alternative such as SSRI or SNRI.  Refills up to date.  She denies SI/HI.  May also benefit from therapy on regular basis.        Other Visit Diagnoses     Flu vaccine need       Relevant Orders   Flu Vaccine QUAD 6+ mos PF IM (Fluarix Quad PF) (Completed)        Follow up plan: Return in about 3 months (around 07/05/2022) for ADHD -- virtual.

## 2022-04-04 NOTE — Assessment & Plan Note (Signed)
Refer to depression plan of care. 

## 2022-04-25 ENCOUNTER — Other Ambulatory Visit: Payer: Self-pay

## 2022-05-27 ENCOUNTER — Other Ambulatory Visit: Payer: Self-pay

## 2022-05-27 MED FILL — Bupropion HCl Tab ER 24HR 150 MG: ORAL | 90 days supply | Qty: 90 | Fill #4 | Status: AC

## 2022-06-24 ENCOUNTER — Ambulatory Visit: Payer: Self-pay | Admitting: Obstetrics and Gynecology

## 2022-06-26 ENCOUNTER — Other Ambulatory Visit: Payer: Self-pay

## 2022-07-05 NOTE — Patient Instructions (Signed)
Attention Deficit Hyperactivity Disorder, Adult Attention deficit hyperactivity disorder (ADHD) is a mental health disorder that starts during childhood. For many people with ADHD, the disorder continues into the adult years. Treatment can help you manage your symptoms. There are three main types of ADHD: Inattentive. With this type, adults have difficulty paying attention. This may affect cognitive abilities. Hyperactive-impulsive. With this type, adults have a lot of energy and have difficulty controlling their behavior. Combination type. Some people may have symptoms of both types. What are the causes? The exact cause of ADHD is not known. Most experts believe a person's genes and environment possibly contribute to ADHD. What increases the risk? The following factors may make you more likely to develop this condition: Having a first-degree relative such as a parent, brother, or sister, with the condition. Being born before 37 weeks of pregnancy (prematurely) or at a low birth weight. Being born to a mother who smoked tobacco or drank alcohol during pregnancy. Having experienced a brain injury. Being exposed to lead or other toxins in the womb or early in life. What are the signs or symptoms? Symptoms of this condition depend on the type of ADHD. Symptoms of the inattentive type include: Difficulty paying attention or following instructions. Often making simple mistakes. Being disorganized. Avoiding tasks that require time and attention. Losing and forgetting things. Symptoms of the hyperactive-impulsive type include: Restlessness. Talking out of turn, interrupting others, or talking too much. Difficulty with: Sitting still. Feeling motivated. Relaxing. Waiting in line or waiting for a turn. People with the combination type have symptoms of both of the other types. In adults, this condition may lead to certain problems, such as: Keeping jobs. Performing tasks at work. Having  stable relationships. Being on time or keeping to a schedule. How is this diagnosed? This condition is diagnosed based on your current symptoms and your history of symptoms. The diagnosis can be made by a health care provider such as a primary care provider or a mental health care specialist. Your health care provider may use a symptom checklist or a behavior rating scale to evaluate your symptoms. Your health care provider may also want to talk with people who have observed your behaviors throughout your life. How is this treated? This condition can be treated with medicines and behavior therapy. Medicines may be the best option to reduce impulsive behaviors and improve attention. Your health care provider may recommend: Stimulant medicines. These are the most common medicines used for adult ADHD. They affect certain chemicals in the brain (neurotransmitters) and improve your ability to control your symptoms. A non-stimulant medicine. These medicines can also improve focus, attention, and impulsive behavior. It may take weeks to months to see the effects of this medicine. Counseling and behavioral management are also important for treating ADHD. Counseling is often used along with medicine. Your health care provider may suggest: Cognitive behavioral therapy (CBT). This type of therapy teaches you to replace negative thoughts and actions with positive thoughts and actions. When used as part of ADHD treatment, this therapy may also include: Coping strategies for organization, time management, impulse control, and stress reduction. Mindfulness and meditation training. Behavioral management. You may work with a coach who is specially trained to help people with ADHD manage and organize activities and function more effectively. Follow these instructions at home: Medicines  Take over-the-counter and prescription medicines only as told by your health care provider. Talk with your health care provider  about the possible side effects of your medicines and   how to manage them. Alcohol use Do not drink alcohol if: Your health care provider tells you not to drink. You are pregnant, may be pregnant, or are planning to become pregnant. If you drink alcohol: Limit how much you use to: 0-1 drink a day for women. 0-2 drinks a day for men. Know how much alcohol is in your drink. In the [place] one drink equals one 12 oz bottle of beer (355 mL), one 5 oz glass of wine (148 mL), or one 1 oz glass of hard liquor (44 mL). Lifestyle  Do not use illegal drugs. Get enough sleep. Eat a healthy diet. Exercise regularly. Exercise can help to reduce stress and anxiety. General instructions Learn as much as you can about adult ADHD, and work closely with your health care providers to find the treatments that work best for you. Follow the same schedule each day. Use reminder devices like notes, calendars, and phone apps to stay on time and organized. Keep all follow-up visits. Your health care provider will need to monitor your condition and adjust your treatment over time. Where to find more information A health care provider may be able to recommend resources that are available online or over the phone. You could start with: Attention Deficit Disorder Association (ADDA): add.org National Institute of Mental Health (NIMH): nimh.nih.gov Contact a health care provider if: Your symptoms continue to cause problems. You have side effects from your medicine, such as: Repeated muscle twitches, coughing, or speech outbursts. Sleep problems. Loss of appetite. Dizziness. Unusually fast heartbeat. Stomach pains. Headaches. You are struggling with anxiety, depression, or substance abuse. Get help right away if: You have a severe reaction to a medicine. This symptom may be an emergency. Get help right away. Call 911. Do not wait to see if the symptom will go away. Do not drive yourself to the hospital. Take  one of these steps if you feel like you may hurt yourself or others, or have thoughts about taking your own life: Go to your nearest emergency room. Call 911. Call the National Suicide Prevention Lifeline at 1-800-273-8255 or 988. This is open 24 hours a day Text the Crisis Text Line at 741741. Summary ADHD is a mental health disorder that starts during childhood and often continues into your adult years. The exact cause of ADHD is not known. Most experts believe genetics and environmental factors contribute to ADHD. There is no cure for ADHD, but treatment with medicine, cognitive behavioral therapy, or behavioral management can help you manage your condition. This information is not intended to replace advice given to you by your health care provider. Make sure you discuss any questions you have with your health care provider. Document Revised: 09/27/2021 Document Reviewed: 09/27/2021 Elsevier Patient Education  2023 Elsevier Inc.  

## 2022-07-07 ENCOUNTER — Other Ambulatory Visit: Payer: Self-pay

## 2022-07-07 ENCOUNTER — Encounter: Payer: Self-pay | Admitting: Nurse Practitioner

## 2022-07-07 ENCOUNTER — Telehealth (INDEPENDENT_AMBULATORY_CARE_PROVIDER_SITE_OTHER): Payer: 59 | Admitting: Nurse Practitioner

## 2022-07-07 DIAGNOSIS — F902 Attention-deficit hyperactivity disorder, combined type: Secondary | ICD-10-CM

## 2022-07-07 DIAGNOSIS — F331 Major depressive disorder, recurrent, moderate: Secondary | ICD-10-CM

## 2022-07-07 DIAGNOSIS — F419 Anxiety disorder, unspecified: Secondary | ICD-10-CM | POA: Diagnosis not present

## 2022-07-07 MED ORDER — METOPROLOL SUCCINATE ER 25 MG PO TB24
12.5000 mg | ORAL_TABLET | Freq: Every day | ORAL | 4 refills | Status: DC
  Filled 2022-07-07 – 2022-08-10 (×2): qty 45, 90d supply, fill #0
  Filled 2022-11-24: qty 45, 90d supply, fill #1
  Filled 2023-02-11: qty 45, 90d supply, fill #2
  Filled 2023-05-14: qty 45, 90d supply, fill #3
  Filled 2023-05-29: qty 45, 90d supply, fill #4

## 2022-07-07 MED ORDER — LISDEXAMFETAMINE DIMESYLATE 30 MG PO CAPS
30.0000 mg | ORAL_CAPSULE | Freq: Every day | ORAL | 0 refills | Status: DC
  Filled 2022-08-10: qty 30, 30d supply, fill #0

## 2022-07-07 MED ORDER — LISDEXAMFETAMINE DIMESYLATE 30 MG PO CAPS: 30.0000 mg | ORAL_CAPSULE | Freq: Every day | ORAL | 0 refills | Status: DC

## 2022-07-07 MED ORDER — BUPROPION HCL ER (XL) 150 MG PO TB24
150.0000 mg | ORAL_TABLET | Freq: Every day | ORAL | 5 refills | Status: DC
  Filled 2022-07-07: qty 90, fill #0
  Filled 2022-08-10: qty 90, 90d supply, fill #0
  Filled 2022-11-24: qty 90, 90d supply, fill #1
  Filled 2023-03-09: qty 90, 90d supply, fill #2
  Filled 2023-05-14 – 2023-05-29 (×2): qty 90, 90d supply, fill #3

## 2022-07-07 MED ORDER — LISDEXAMFETAMINE DIMESYLATE 30 MG PO CAPS
30.0000 mg | ORAL_CAPSULE | Freq: Every day | ORAL | 0 refills | Status: DC
  Filled 2022-09-10: qty 30, 30d supply, fill #0

## 2022-07-07 NOTE — Progress Notes (Signed)
Pt scheduled  

## 2022-07-07 NOTE — Assessment & Plan Note (Signed)
Chronic, ongoing. At this time continue Wellbutrin 150 MG XL daily as is offering benefit, however made her aware of possibility for interactions with Vyvanse and her Metoprolol -- if these present I recommend stopping Wellbutrin and switching to an alternative such as SSRI or SNRI.  Refills sent in.  She denies SI/HI.  May also benefit from therapy on regular basis.

## 2022-07-07 NOTE — Assessment & Plan Note (Signed)
Refer to depression plan of care.

## 2022-07-07 NOTE — Progress Notes (Signed)
LMP 04/27/2020    Subjective:    Patient ID: Jessica Cross, female    DOB: August 05, 1990, 32 y.o.   MRN: [id number]  HPI: Jessica Cross is a 32 y.o. female  Chief Complaint  Patient presents with   ADHD   This visit was completed via video visit through [facility] due to the restrictions of the COVID-19 pandemic. All issues as above were discussed and addressed. Physical exam was done as above through visual confirmation on video through MyChart. If it was felt that the patient should be evaluated in the office, they were directed there. The patient verbally consented to this visit. Location of the patient: home Location of the provider: work Those involved with this call:  Provider: Marnee Guarneri, DNP CMA: Frazier Butt, [place] Desk/Registration: [employer]  Time spent on call:  21 minutes with patient face to face via video conference. More than 50% of this time was spent in counseling and coordination of care. 15 minutes total spent in review of patient's record and preparation of their chart.  I verified patient identity using two factors (patient name and date of birth). Patient consents verbally to being seen via telemedicine visit today.    ADHD FOLLOW UP Diagnosed by [facility] in November 2022 and started on Vyvanse 20 MG daily.  Currently is taking 30 MG daily, as increased by PCP past visit.   Last fill on 06/26/22.  She continues Wellbutrin as well for mood, which offers benefit. ADHD status: ongoing Satisfied with current therapy: yes Medication compliance:  good compliance Controlled substance contract: yes Previous psychiatry evaluation: yes Previous medications: Vyvanse   Taking meds on weekends/vacations: no Work/school performance:  good Difficulty sustaining attention/completing tasks:  no Distracted by extraneous stimuli: no Does not listen when spoken to:  a little   Fidgets with hands or feet: yes Unable to stay in seat: yes Blurts  out/interrupts others: no ADHD Medication Side Effects: yes    Decreased appetite: no -- is taking Pepcid which helps with reflux    Headache: no    Sleeping disturbance pattern: no    Irritability: no    Rebound effects (worse than baseline) off medication: no    Anxiousness: no    Dizziness: no    Tics: no     07/07/2022    3:22 PM 04/04/2022    3:31 PM 02/05/2022    2:56 PM 01/15/2022    3:09 PM 12/06/2021   10:53 AM  Depression screen PHQ 2/9  Decreased Interest 0 '1 1 1 1  '$ Down, Depressed, Hopeless 0 '1 1 1 1  '$ PHQ - 2 Score 0 '2 2 2 2  '$ Altered sleeping 0 '1 2 2 2  '$ Tired, decreased energy 0 '2 1 2 2  '$ Change in appetite 0 1 0 0 0  Feeling bad or failure about yourself  0 '1 1 1 1  '$ Trouble concentrating 0 '2 2 1 2  '$ Moving slowly or fidgety/restless 0 0 0 0 0  Suicidal thoughts 0 0 0 0 0  PHQ-9 Score 0 '9 8 8 9  '$ Difficult doing work/chores Not difficult at all Somewhat difficult Somewhat difficult Somewhat difficult Somewhat difficult       07/07/2022    3:22 PM 04/04/2022    3:31 PM 02/05/2022    2:56 PM 01/15/2022    3:09 PM  GAD 7 : Generalized Anxiety Score  Nervous, Anxious, on Edge '1 2 1 1  '$ Control/stop worrying 1 2 2  2  Worry too much - different things '1 2 2 2  '$ Trouble relaxing '1 1 1 1  '$ Restless '1 1 1 1  '$ Easily annoyed or irritable '1 2 1 2  '$ Afraid - awful might happen '1 1 1 2  '$ Total GAD 7 Score '7 11 9 11  '$ Anxiety Difficulty Somewhat difficult  Somewhat difficult Somewhat difficult   Relevant past medical, surgical, family and social history reviewed and updated as indicated. Interim medical history since our last visit reviewed. Allergies and medications reviewed and updated.  Review of Systems  Constitutional:  Negative for activity change, appetite change, chills, fatigue and fever.  Respiratory:  Negative for cough, chest tightness and wheezing.   Gastrointestinal: Negative.   Neurological: Negative.   Psychiatric/Behavioral: Negative.      Per HPI unless  specifically indicated above     Objective:    LMP 04/27/2020   Wt Readings from Last 3 Encounters:  04/04/22 145 lb 6.4 oz (66 kg)  02/05/22 145 lb 9.6 oz (66 kg)  01/15/22 144 lb 14.4 oz (65.7 kg)    Physical Exam Vitals and nursing note reviewed.  Constitutional:      General: She is awake. She is not in acute distress.    Appearance: She is well-developed. She is not ill-appearing.  HENT:     Head: Normocephalic.     Right Ear: Hearing normal.     Left Ear: Hearing normal.  Eyes:     General: Lids are normal.        Right eye: No discharge.        Left eye: No discharge.     Conjunctiva/sclera: Conjunctivae normal.  Pulmonary:     Effort: Pulmonary effort is normal. No accessory muscle usage or respiratory distress.  Musculoskeletal:     Cervical back: Normal range of motion.  Neurological:     Mental Status: She is alert and oriented to person, place, and time.  Psychiatric:        Attention and Perception: Attention normal.        Mood and Affect: Mood normal.        Behavior: Behavior normal. Behavior is cooperative.        Thought Content: Thought content normal.        Judgment: Judgment normal.    Results for orders placed or performed in visit on 01/15/22  CBC with Differential/Platelet  Result Value Ref Range   WBC 5.0 3.4 - 10.8 x10E3/uL   RBC 4.61 3.77 - 5.28 x10E6/uL   Hemoglobin 14.4 11.1 - 15.9 g/dL   Hematocrit 42.2 34.0 - 46.6 %   MCV 92 79 - 97 fL   MCH 31.2 26.6 - 33.0 pg   MCHC 34.1 31.5 - 35.7 g/dL   RDW 11.9 11.7 - 15.4 %   Platelets 258 150 - 450 x10E3/uL   Neutrophils 59 Not Estab. %   Lymphs 29 Not Estab. %   Monocytes 9 Not Estab. %   Eos 3 Not Estab. %   Basos 0 Not Estab. %   Neutrophils Absolute 3.0 1.4 - 7.0 x10E3/uL   Lymphocytes Absolute 1.5 0.7 - 3.1 x10E3/uL   Monocytes Absolute 0.4 0.1 - 0.9 x10E3/uL   EOS (ABSOLUTE) 0.2 0.0 - 0.4 x10E3/uL   Basophils Absolute 0.0 0.0 - 0.2 x10E3/uL   Immature Granulocytes 0 Not Estab.  %   Immature Grans (Abs) 0.0 0.0 - 0.1 [id number]/FK  Basic Metabolic Panel (BMET)  Result Value Ref Range  Glucose 83 70 - 99 mg/dL   BUN 12 6 - 20 mg/dL   Creatinine, Ser 0.80 0.57 - 1.00 mg/dL   eGFR 100 >59 mL/min/1.73   BUN/Creatinine Ratio 15 9 - 23   Sodium 137 134 - 144 mmol/L   Potassium 4.1 3.5 - 5.2 mmol/L   Chloride 100 96 - 106 mmol/L   CO2 24 20 - 29 mmol/L   Calcium 10.1 8.7 - 10.2 mg/dL      Assessment & Plan:   Problem List Items Addressed This Visit       Other   ADHD (attention deficit hyperactivity disorder)    Chronic, ongoing.  Diagnosed at [facility] in November 2022. At this time continue Wellbutrin 150 MG XL daily as is offering benefit and she is fearful of stopping due to this.  Continue Vyvanse 30 MG daily.  Refills on Vyvanse sent.  She is aware of office rules on this and agreeable to yearly UDS and controlled subs agreement.  Both are up to date.  Return in 3 months.  UDS due next visit.      Anxiety    Refer to depression plan of care.      Relevant Medications   buPROPion (WELLBUTRIN XL) 150 MG 24 hr tablet   Depression - Primary    Chronic, ongoing. At this time continue Wellbutrin 150 MG XL daily as is offering benefit, however made her aware of possibility for interactions with Vyvanse and her Metoprolol -- if these present I recommend stopping Wellbutrin and switching to an alternative such as SSRI or SNRI.  Refills sent in.  She denies SI/HI.  May also benefit from therapy on regular basis.        Relevant Medications   buPROPion (WELLBUTRIN XL) 150 MG 24 hr tablet    I discussed the assessment and treatment plan with the patient. The patient was provided an opportunity to ask questions and all were answered. The patient agreed with the plan and demonstrated an understanding of the instructions.   The patient was advised to call back or seek an in-person evaluation if the symptoms worsen or if the condition fails to  improve as anticipated.   I provided 21+ minutes of time during this encounter.    Follow up plan: Return in about 3 months (around 10/06/2022) for ADHD -- needs UDS.

## 2022-07-07 NOTE — Assessment & Plan Note (Signed)
Chronic, ongoing.  Diagnosed at [facility] in November 2022. At this time continue Wellbutrin 150 MG XL daily as is offering benefit and she is fearful of stopping due to this.  Continue Vyvanse 30 MG daily.  Refills on Vyvanse sent.  She is aware of office rules on this and agreeable to yearly UDS and controlled subs agreement.  Both are up to date.  Return in 3 months.  UDS due next visit.

## 2022-08-11 ENCOUNTER — Other Ambulatory Visit: Payer: Self-pay

## 2022-09-10 ENCOUNTER — Other Ambulatory Visit: Payer: Self-pay

## 2022-09-13 ENCOUNTER — Other Ambulatory Visit: Payer: Self-pay | Admitting: Nurse Practitioner

## 2022-09-14 ENCOUNTER — Other Ambulatory Visit: Payer: Self-pay

## 2022-09-15 ENCOUNTER — Other Ambulatory Visit: Payer: Self-pay

## 2022-09-29 NOTE — Progress Notes (Unsigned)
GYNECOLOGY ANNUAL PHYSICAL EXAM PROGRESS NOTE  Subjective:    Jessica Cross is a 32 y.o. G48P1001 female who presents for an annual exam. The patient is sexually active. The patient participates in regular exercise: no. Has the patient ever been transfused or tattooed?: yes. The patient reports that there is not domestic violence in her life.   The patient has the following complaints today:  She has left breast/underarm pain x 3 months. Pain comes and goes and it is sharp, dull, tender and aching.  Thinks it may be cyclical even though she no longer has cycles.  Also notes some mood changes and occasional hot flushes.   Menstrual History: Menarche age: 73 Patient's last menstrual period was 04/27/2020.    Gynecologic History:  Contraception: status post hysterectomy History of STI's: Denies Last Pap:  09/06/2017. Results were: normal.  Denies h/o abnormal pap smears  Last mammogram: Not age appropriate   OB History  Gravida Para Term Preterm AB Living  1 1 1  0 0 1  SAB IAB Ectopic Multiple Live Births  0 0 0 0 1    # Outcome Date GA Lbr Len/2nd Weight Sex Delivery Anes PTL Lv  1 Term 2008     Vag-Spont   LIV    Past Medical History:  Diagnosis Date   Anemia    H/O    Anxiety    Asthma    as a child    Depression    Migraine    Tobacco use     Past Surgical History:  Procedure Laterality Date   COSMETIC SURGERY     CYSTOSCOPY  05/14/2020   Procedure: CYSTOSCOPY;  Surgeon: Ward, Elenora Fender, MD;  Location: [facility];  Service: Gynecology;;   NOSE SURGERY     Due to broken nose as a child.   TOTAL LAPAROSCOPIC HYSTERECTOMY WITH SALPINGECTOMY Bilateral 05/14/2020   Procedure: TOTAL LAPAROSCOPIC HYSTERECTOMY WITH BILATERAL SALPINGECTOMY;  Surgeon: Ward, Elenora Fender, MD;  Location: [facility];  Service: Gynecology;  Laterality: Bilateral;    Family History  Problem Relation Age of Onset   Healthy Mother    Healthy Father    Kidney cancer Maternal Aunt    Breast  cancer Maternal Grandmother    Cancer Paternal Grandfather     Social History   Socioeconomic History   Marital status: Married    Spouse name: Not on file   Number of children: Not on file   Years of education: Not on file   Highest education level: Not on file  Occupational History   Not on file  Tobacco Use   Smoking status: Former    Packs/day: .5    Types: Cigarettes    Quit date: 10/21/2016    Years since quitting: 5.9   Smokeless tobacco: Never  Vaping Use   Vaping Use: Some days   Substances: Nicotine  Substance and Sexual Activity   Alcohol use: Yes    Alcohol/week: 5.0 - 10.0 standard drinks of alcohol    Types: 5 - 10 Standard drinks or equivalent per week    Comment: occasionally   Drug use: No   Sexual activity: Yes    Partners: Male    Birth control/protection: Surgical  Other Topics Concern   Not on file  Social History Narrative   Not on file   Social Determinants of Health   Financial Resource Strain: Not on file  Food Insecurity: Not on file  Transportation Needs: Not on file  Physical  Activity: Not on file  Stress: Not on file  Social Connections: Not on file  Intimate Partner Violence: Not on file    Current Outpatient Medications on File Prior to Visit  Medication Sig Dispense Refill   buPROPion (WELLBUTRIN XL) 150 MG 24 hr tablet Take 1 tablet (150 mg total) by mouth daily. 90 tablet 5   lisdexamfetamine (VYVANSE) 30 MG capsule Take 1 capsule (30 mg total) by mouth daily. 30 capsule 0   lisdexamfetamine (VYVANSE) 30 MG capsule Take 1 capsule (30 mg total) by mouth daily. 30 capsule 0   lisdexamfetamine (VYVANSE) 30 MG capsule Take 1 capsule (30 mg total) by mouth daily. 30 capsule 0   metoprolol succinate (TOPROL-XL) 25 MG 24 hr tablet Take 0.5 tablets (12.5 mg total) by mouth daily. 45 tablet 4   Norethindrone Acetate-Ethinyl Estradiol (JUNEL 1.5/30) 1.5-30 MG-MCG tablet Take 1 tablet by mouth daily. Take continuously as directed. 112  tablet 3   triamcinolone cream (KENALOG) 0.1 % Apply 1 Application topically 2 (two) times daily. 30 g 0   No current facility-administered medications on file prior to visit.    Allergies  Allergen Reactions   Bactrim [Sulfamethoxazole-Trimethoprim] Hives     Review of Systems Constitutional: negative for chills, fatigue, fevers and sweats Eyes: negative for irritation, redness and visual disturbance Ears, nose, mouth, throat, and face: negative for hearing loss, nasal congestion, snoring and tinnitus Respiratory: negative for asthma, cough, sputum Cardiovascular: negative for chest pain, dyspnea, exertional chest pressure/discomfort, irregular heart beat, palpitations and syncope Gastrointestinal: negative for abdominal pain, change in bowel habits, nausea and vomiting Genitourinary: negative for abnormal menstrual periods, genital lesions, sexual problems and vaginal discharge, dysuria and urinary incontinence Integument/breast: negative for breast lump, and nipple discharge. Positive for breast tenderness  Hematologic/lymphatic: negative for bleeding and easy bruising Musculoskeletal:negative for back pain and muscle weakness Neurological: negative for dizziness, headaches, vertigo and weakness Endocrine: negative for diabetic symptoms including polydipsia, polyuria and skin dryness Allergic/Immunologic: negative for hay fever and urticaria      Objective:  Last menstrual period 04/27/2020. Blood pressure 119/82, pulse 90, resp. rate 16, height 5' (1.524 m), weight 146 lb 11.2 oz (66.5 kg), last menstrual period 04/27/2020.Body mass index is 28.65 kg/m.    General Appearance:    Alert, cooperative, no distress, appears stated age  Head:    Normocephalic, without obvious abnormality, atraumatic  Eyes:    PERRL, conjunctiva/corneas clear, EOM's intact, both eyes  Ears:    Normal external ear canals, both ears  Nose:   Nares normal, septum midline, mucosa normal, no drainage or  sinus tenderness  Throat:   Lips, mucosa, and tongue normal; teeth and gums normal  Neck:   Supple, symmetrical, trachea midline, no adenopathy; thyroid: no enlargement/tenderness/nodules; no carotid bruit or JVD  Back:     Symmetric, no curvature, ROM normal, no CVA tenderness  Lungs:     Clear to auscultation bilaterally, respirations unlabored  Chest Wall:    No tenderness or deformity   Heart:    Regular rate and rhythm, S1 and S2 normal, no murmur, rub or gallop  Breast Exam:    Mild tenderness of left breast with area of denser breast tissue noted at 1-2 o clock.  No discrete palpable masses, or nipple abnormality  Abdomen:     Soft, non-tender, bowel sounds active all four quadrants, no masses, no organomegaly.    Genitalia:    Pelvic:external genitalia normal, vagina without lesions, discharge, or tenderness, rectovaginal septum  normal. Cervix normal in appearance, no cervical motion tenderness, no adnexal masses or tenderness.  Uterus normal size, shape, mobile, regular contours, nontender.  Rectal:    Normal external sphincter.  No hemorrhoids appreciated. Internal exam not done.   Extremities:   Extremities normal, atraumatic, no cyanosis or edema  Pulses:   2+ and symmetric all extremities  Skin:   Skin color, texture, turgor normal, no rashes or lesions  Lymph nodes:   Cervical, supraclavicular, and axillary nodes normal  Neurologic:   CNII-XII intact, normal strength, sensation and reflexes throughout   .  Labs:  Lab Results  Component Value Date   WBC 5.0 01/15/2022   HGB 14.4 01/15/2022   HCT 42.2 01/15/2022   MCV 92 01/15/2022   PLT 258 01/15/2022    Lab Results  Component Value Date   CREATININE 0.80 01/15/2022   BUN 12 01/15/2022   NA 137 01/15/2022   K 4.1 01/15/2022   CL 100 01/15/2022   CO2 24 01/15/2022    Lab Results  Component Value Date   ALT 12 05/10/2021   AST 17 05/10/2021   ALKPHOS 66 05/10/2021   BILITOT 0.4 05/10/2021    Lab Results   Component Value Date   TSH 1.940 05/10/2021     Assessment:   1. Well woman exam with routine gynecological exam   2. Breast tenderness in female   3. S/P hysterectomy      Plan:  - Blood tests: Ordered today. - Breast self exam technique reviewed and patient encouraged to perform self-exam monthly.  Discussed that changes may be cyclical, related to hormone changes each month especially in light of other symptoms of mood changes and hot flushes. Advised on hormonal and non-hormonal remedies.  - Contraception: status post hysterectomy. - Discussed healthy lifestyle modifications and self breast exams. - Mammogram  : Not age appropriate - Pap smear  no longer needed . - COVID vaccination status: 2 dose Moderna series received, eligible for booster.  - Follow up in 1 year for annual exam   Hildred LaserAnika Naylene Foell, MD [facility]

## 2022-09-29 NOTE — Patient Instructions (Signed)
Preventive Care 21-34 Years Old, Female Preventive care refers to lifestyle choices and visits with your health care provider that can promote health and wellness. Preventive care visits are also called wellness exams. What can I expect for my preventive care visit? Counseling During your preventive care visit, your health care provider may ask about your: Medical history, including: Past medical problems. Family medical history. Pregnancy history. Current health, including: Menstrual cycle. Method of birth control. Emotional well-being. Home life and relationship well-being. Sexual activity and sexual health. Lifestyle, including: Alcohol, nicotine or tobacco, and drug use. Access to firearms. Diet, exercise, and sleep habits. Work and work environment. Sunscreen use. Safety issues such as seatbelt and bike helmet use. Physical exam Your health care provider may check your: Height and weight. These may be used to calculate your BMI (body mass index). BMI is a measurement that tells if you are at a healthy weight. Waist circumference. This measures the distance around your waistline. This measurement also tells if you are at a healthy weight and may help predict your risk of certain diseases, such as type 2 diabetes and high blood pressure. Heart rate and blood pressure. Body temperature. Skin for abnormal spots. What immunizations do I need?  Vaccines are usually given at various ages, according to a schedule. Your health care provider will recommend vaccines for you based on your age, medical history, and lifestyle or other factors, such as travel or where you work. What tests do I need? Screening Your health care provider may recommend screening tests for certain conditions. This may include: Pelvic exam and Pap test. Lipid and cholesterol levels. Diabetes screening. This is done by checking your blood sugar (glucose) after you have not eaten for a while (fasting). Hepatitis  B test. Hepatitis C test. HIV (human immunodeficiency virus) test. STI (sexually transmitted infection) testing, if you are at risk. BRCA-related cancer screening. This may be done if you have a family history of breast, ovarian, tubal, or peritoneal cancers. Talk with your health care provider about your test results, treatment options, and if necessary, the need for more tests. Follow these instructions at home: Eating and drinking  Eat a healthy diet that includes fresh fruits and vegetables, whole grains, lean protein, and low-fat dairy products. Take vitamin and mineral supplements as recommended by your health care provider. Do not drink alcohol if: Your health care provider tells you not to drink. You are pregnant, may be pregnant, or are planning to become pregnant. If you drink alcohol: Limit how much you have to 0-1 drink a day. Know how much alcohol is in your drink. In the [place] one drink equals one 12 oz bottle of beer (355 mL), one 5 oz glass of wine (148 mL), or one 1 oz glass of hard liquor (44 mL). Lifestyle Brush your teeth every morning and night with fluoride toothpaste. Floss one time each day. Exercise for at least 30 minutes 5 or more days each week. Do not use any products that contain nicotine or tobacco. These products include cigarettes, chewing tobacco, and vaping devices, such as e-cigarettes. If you need help quitting, ask your health care provider. Do not use drugs. If you are sexually active, practice safe sex. Use a condom or other form of protection to prevent STIs. If you do not wish to become pregnant, use a form of birth control. If you plan to become pregnant, see your health care provider for a prepregnancy visit. Find healthy ways to manage stress, such as: Meditation,   yoga, or listening to music. Journaling. Talking to a trusted person. Spending time with friends and family. Minimize exposure to UV radiation to reduce your risk of skin  cancer. Safety Always wear your seat belt while driving or riding in a vehicle. Do not drive: If you have been drinking alcohol. Do not ride with someone who has been drinking. If you have been using any mind-altering substances or drugs. While texting. When you are tired or distracted. Wear a helmet and other protective equipment during sports activities. If you have firearms in your house, make sure you follow all gun safety procedures. Seek help if you have been physically or sexually abused. What's next? Go to your health care provider once a year for an annual wellness visit. Ask your health care provider how often you should have your eyes and teeth checked. Stay up to date on all vaccines. This information is not intended to replace advice given to you by your health care provider. Make sure you discuss any questions you have with your health care provider. Document Revised: 12/05/2020 Document Reviewed: 12/05/2020 Elsevier Patient Education  2023 [employer] Breast Self-Awareness Breast self-awareness is knowing how your breasts look and feel. You need to: Check your breasts on a regular basis. Tell your doctor about any changes. Become familiar with the look and feel of your breasts. This can help you catch a breast problem while it is still small and can be treated. You should do breast self-exams even if you have breast implants. What you need: A mirror. A well-lit room. A pillow or other soft object. How to do a breast self-exam Follow these steps to do a breast self-exam: Look for changes  Take off all the clothes above your waist. Stand in front of a mirror in a room with good lighting. Put your hands down at your sides. Compare your breasts in the mirror. Look for any difference between them, such as: A difference in shape. A difference in size. Wrinkles, dips, and bumps in one breast and not the other. Look at each breast for changes in the skin, such  as: Redness. Scaly areas. Skin that has gotten thicker. Dimpling. Open sores (ulcers). Look for changes in your nipples, such as: Fluid coming out of a nipple. Fluid around a nipple. Bleeding. Dimpling. Redness. A nipple that looks pushed in (retracted), or that has changed position. Feel for changes Lie on your back. Feel each breast. To do this: Pick a breast to feel. Place a pillow under the shoulder closest to that breast. Put the arm closest to that breast behind your head. Feel the nipple area of that breast using the hand of your other arm. Feel the area with the pads of your three middle fingers by making small circles with your fingers. Use light, medium, and firm pressure. Continue the overlapping circles, moving downward over the breast. Keep making circles with your fingers. Stop when you feel your ribs. Start making circles with your fingers again, this time going upward until you reach your collarbone. Then, make circles outward across your breast and into your armpit area. Squeeze your nipple. Check for discharge and lumps. Repeat these steps to check your other breast. Sit or stand in the tub or shower. With soapy water on your skin, feel each breast the same way you did when you were lying down. Write down what you find Writing down what you find can help you remember what to tell your doctor. Write down: What is   normal for each breast. Any changes you find in each breast. These include: The kind of changes you find. A tender or painful breast. Any lump you find. Write down its size and where it is. When you last had your monthly period (menstrual cycle). General tips If you are breastfeeding, the best time to check your breasts is after you feed your baby or after you use a breast pump. If you get monthly bleeding, the best time to check your breasts is 5-7 days after your monthly cycle ends. With time, you will become comfortable with the self-exam. You will  also start to know if there are changes in your breasts. Contact a doctor if: You see a change in the shape or size of your breasts or nipples. You see a change in the skin of your breast or nipples, such as red or scaly skin. You have fluid coming from your nipples that is not normal. You find a new lump or thick area. You have breast pain. You have any concerns about your breast health. Summary Breast self-awareness includes looking for changes in your breasts and feeling for changes within your breasts. You should do breast self-awareness in front of a mirror in a well-lit room. If you get monthly periods (menstrual cycles), the best time to check your breasts is 5-7 days after your period ends. Tell your doctor about any changes you see in your breasts. Changes include changes in size, changes on the skin, painful or tender breasts, or fluid from your nipples that is not normal. This information is not intended to replace advice given to you by your health care provider. Make sure you discuss any questions you have with your health care provider. Document Revised: 11/14/2021 Document Reviewed: 04/11/2021 Elsevier Patient Education  2023 [employer] Inc.  

## 2022-09-30 ENCOUNTER — Encounter: Payer: Self-pay | Admitting: Obstetrics and Gynecology

## 2022-09-30 ENCOUNTER — Ambulatory Visit (INDEPENDENT_AMBULATORY_CARE_PROVIDER_SITE_OTHER): Payer: 59 | Admitting: Obstetrics and Gynecology

## 2022-09-30 VITALS — BP 119/82 | HR 90 | Resp 16 | Ht 60.0 in | Wt 146.7 lb

## 2022-09-30 DIAGNOSIS — Z01419 Encounter for gynecological examination (general) (routine) without abnormal findings: Secondary | ICD-10-CM

## 2022-09-30 DIAGNOSIS — Z90710 Acquired absence of both cervix and uterus: Secondary | ICD-10-CM

## 2022-09-30 DIAGNOSIS — N644 Mastodynia: Secondary | ICD-10-CM

## 2022-10-01 LAB — COMPREHENSIVE METABOLIC PANEL
ALT: 12 IU/L (ref 0–32)
AST: 18 IU/L (ref 0–40)
Albumin/Globulin Ratio: 2 (ref 1.2–2.2)
Albumin: 4.9 g/dL (ref 3.9–4.9)
Alkaline Phosphatase: 57 IU/L (ref 44–121)
BUN/Creatinine Ratio: 12 (ref 9–23)
BUN: 10 mg/dL (ref 6–20)
Bilirubin Total: 0.5 mg/dL (ref 0.0–1.2)
CO2: 22 mmol/L (ref 20–29)
Calcium: 10.2 mg/dL (ref 8.7–10.2)
Chloride: 101 mmol/L (ref 96–106)
Creatinine, Ser: 0.83 mg/dL (ref 0.57–1.00)
Globulin, Total: 2.5 g/dL (ref 1.5–4.5)
Glucose: 104 mg/dL — ABNORMAL HIGH (ref 70–99)
Potassium: 4.9 mmol/L (ref 3.5–5.2)
Sodium: 137 mmol/L (ref 134–144)
Total Protein: 7.4 g/dL (ref 6.0–8.5)
eGFR: 95 mL/min/{1.73_m2} (ref >59)

## 2022-10-01 LAB — CBC
Hematocrit: 40.8 % (ref 34.0–46.6)
Hemoglobin: 13.8 g/dL (ref 11.1–15.9)
MCH: 30.5 pg (ref 26.6–33.0)
MCHC: 33.8 g/dL (ref 31.5–35.7)
MCV: 90 fL (ref 79–97)
Platelets: 244 10*3/uL (ref 150–450)
RBC: 4.52 x10E6/uL (ref 3.77–5.28)
RDW: 12.1 % (ref 11.7–15.4)
WBC: 5.8 10*3/uL (ref 3.4–10.8)

## 2022-10-01 LAB — TSH: TSH: 3.27 u[IU]/mL (ref 0.450–4.500)

## 2022-10-05 NOTE — Patient Instructions (Signed)
Be Involved in Your Health Care:  Taking Medications When medications are taken as directed, they can greatly improve your health. But if they are not taken as instructed, they may not work. In some cases, not taking them correctly can be harmful. To help ensure your treatment remains effective and safe, understand your medications and how to take them.  Your lab results, notes and after visit summary will be available on My Chart. We strongly encourage you to use this feature. If lab results are abnormal the clinic will contact you with the appropriate steps. If the clinic does not contact you assume the results are satisfactory. You can always see your results on My Chart. If you have questions regarding your condition, please contact the clinic during office hours. You can also ask questions on My Chart.  We at [facility] are grateful that you chose us to provide care. We strive to provide excellent and compassionate care and are always looking for feedback. If you get a survey from the clinic please complete this.   Attention Deficit Hyperactivity Disorder, Adult Attention deficit hyperactivity disorder (ADHD) is a mental health disorder that starts during childhood. For many people with ADHD, the disorder continues into the adult years. Treatment can help you manage your symptoms. There are three main types of ADHD: Inattentive. With this type, adults have difficulty paying attention. This may affect cognitive abilities. Hyperactive-impulsive. With this type, adults have a lot of energy and have difficulty controlling their behavior. Combination type. Some people may have symptoms of both types. What are the causes? The exact cause of ADHD is not known. Most experts believe a person's genes and environment possibly contribute to ADHD. What increases the risk? The following factors may make you more likely to develop this condition: Having a first-degree relative such as a parent,  brother, or sister, with the condition. Being born before 37 weeks of pregnancy (prematurely) or at a low birth weight. Being born to a mother who smoked tobacco or drank alcohol during pregnancy. Having experienced a brain injury. Being exposed to lead or other toxins in the womb or early in life. What are the signs or symptoms? Symptoms of this condition depend on the type of ADHD. Symptoms of the inattentive type include: Difficulty paying attention or following instructions. Often making simple mistakes. Being disorganized. Avoiding tasks that require time and attention. Losing and forgetting things. Symptoms of the hyperactive-impulsive type include: Restlessness. Talking out of turn, interrupting others, or talking too much. Difficulty with: Sitting still. Feeling motivated. Relaxing. Waiting in line or waiting for a turn. People with the combination type have symptoms of both of the other types. In adults, this condition may lead to certain problems, such as: Keeping jobs. Performing tasks at work. Having stable relationships. Being on time or keeping to a schedule. How is this diagnosed? This condition is diagnosed based on your current symptoms and your history of symptoms. The diagnosis can be made by a health care provider such as a primary care provider or a mental health care specialist. Your health care provider may use a symptom checklist or a behavior rating scale to evaluate your symptoms. Your health care provider may also want to talk with people who have observed your behaviors throughout your life. How is this treated? This condition can be treated with medicines and behavior therapy. Medicines may be the best option to reduce impulsive behaviors and improve attention. Your health care provider may recommend: Stimulant medicines. These are   the most common medicines used for adult ADHD. They affect certain chemicals in the brain (neurotransmitters) and improve your  ability to control your symptoms. A non-stimulant medicine. These medicines can also improve focus, attention, and impulsive behavior. It may take weeks to months to see the effects of this medicine. Counseling and behavioral management are also important for treating ADHD. Counseling is often used along with medicine. Your health care provider may suggest: Cognitive behavioral therapy (CBT). This type of therapy teaches you to replace negative thoughts and actions with positive thoughts and actions. When used as part of ADHD treatment, this therapy may also include: Coping strategies for organization, time management, impulse control, and stress reduction. Mindfulness and meditation training. Behavioral management. You may work with a coach who is specially trained to help people with ADHD manage and organize activities and function more effectively. Follow these instructions at home: Medicines  Take over-the-counter and prescription medicines only as told by your health care provider. Talk with your health care provider about the possible side effects of your medicines and how to manage them. Alcohol use Do not drink alcohol if: Your health care provider tells you not to drink. You are pregnant, may be pregnant, or are planning to become pregnant. If you drink alcohol: Limit how much you use to: 0-1 drink a day for women. 0-2 drinks a day for men. Know how much alcohol is in your drink. In the [place] one drink equals one 12 oz bottle of beer (355 mL), one 5 oz glass of wine (148 mL), or one 1 oz glass of hard liquor (44 mL). Lifestyle  Do not use illegal drugs. Get enough sleep. Eat a healthy diet. Exercise regularly. Exercise can help to reduce stress and anxiety. General instructions Learn as much as you can about adult ADHD, and work closely with your health care providers to find the treatments that work best for you. Follow the same schedule each day. Use reminder devices like  notes, calendars, and phone apps to stay on time and organized. Keep all follow-up visits. Your health care provider will need to monitor your condition and adjust your treatment over time. Where to find more information A health care provider may be able to recommend resources that are available online or over the phone. You could start with: Attention Deficit Disorder Association (ADDA): add.org National Institute of Mental Health (NIMH): nimh.nih.gov Contact a health care provider if: Your symptoms continue to cause problems. You have side effects from your medicine, such as: Repeated muscle twitches, coughing, or speech outbursts. Sleep problems. Loss of appetite. Dizziness. Unusually fast heartbeat. Stomach pains. Headaches. You are struggling with anxiety, depression, or substance abuse. Get help right away if: You have a severe reaction to a medicine. This symptom may be an emergency. Get help right away. Call 911. Do not wait to see if the symptom will go away. Do not drive yourself to the hospital. Take one of these steps if you feel like you may hurt yourself or others, or have thoughts about taking your own life: Go to your nearest emergency room. Call 911. Call the National Suicide Prevention Lifeline at 1-800-273-8255 or 988. This is open 24 hours a day Text the Crisis Text Line at 741741. Summary ADHD is a mental health disorder that starts during childhood and often continues into your adult years. The exact cause of ADHD is not known. Most experts believe genetics and environmental factors contribute to ADHD. There is no cure for ADHD, but   treatment with medicine, cognitive behavioral therapy, or behavioral management can help you manage your condition. This information is not intended to replace advice given to you by your health care provider. Make sure you discuss any questions you have with your health care provider. Document Revised: 09/27/2021 Document Reviewed:  09/27/2021 Elsevier Patient Education  2023 Elsevier Inc.  

## 2022-10-10 ENCOUNTER — Other Ambulatory Visit: Payer: Self-pay

## 2022-10-10 ENCOUNTER — Ambulatory Visit (INDEPENDENT_AMBULATORY_CARE_PROVIDER_SITE_OTHER): Payer: 59 | Admitting: Nurse Practitioner

## 2022-10-10 VITALS — BP 113/77 | HR 98 | Temp 98.5°F | Wt 147.8 lb

## 2022-10-10 DIAGNOSIS — F331 Major depressive disorder, recurrent, moderate: Secondary | ICD-10-CM | POA: Diagnosis not present

## 2022-10-10 DIAGNOSIS — R002 Palpitations: Secondary | ICD-10-CM

## 2022-10-10 DIAGNOSIS — F419 Anxiety disorder, unspecified: Secondary | ICD-10-CM | POA: Diagnosis not present

## 2022-10-10 DIAGNOSIS — F902 Attention-deficit hyperactivity disorder, combined type: Secondary | ICD-10-CM | POA: Diagnosis not present

## 2022-10-10 MED ORDER — LISDEXAMFETAMINE DIMESYLATE 40 MG PO CAPS
40.0000 mg | ORAL_CAPSULE | ORAL | 0 refills | Status: DC
  Filled 2023-01-07: qty 30, 30d supply, fill #0

## 2022-10-10 MED ORDER — BUSPIRONE HCL 10 MG PO TABS
10.0000 mg | ORAL_TABLET | Freq: Every day | ORAL | 4 refills | Status: DC
  Filled 2022-10-10: qty 90, 90d supply, fill #0
  Filled 2023-01-23: qty 90, 90d supply, fill #1
  Filled 2023-05-14: qty 90, 90d supply, fill #2
  Filled 2023-05-29 – 2023-08-20 (×2): qty 90, 90d supply, fill #3

## 2022-10-10 MED ORDER — LISDEXAMFETAMINE DIMESYLATE 40 MG PO CAPS
40.0000 mg | ORAL_CAPSULE | ORAL | 0 refills | Status: DC
  Filled 2022-10-17: qty 30, 30d supply, fill #0

## 2022-10-10 MED ORDER — LISDEXAMFETAMINE DIMESYLATE 40 MG PO CAPS
40.0000 mg | ORAL_CAPSULE | ORAL | 0 refills | Status: DC
  Filled 2022-11-24: qty 30, 30d supply, fill #0

## 2022-10-10 NOTE — Assessment & Plan Note (Signed)
Chronic, ongoing.  Diagnosed at [facility] in November 2022. At this time continue Wellbutrin 150 MG XL daily as is offering benefit and she is fearful of stopping due to this.  Increase Vyvanse to 40 MG daily.  Refills on Vyvanse sent.  She is aware of office rules on this and agreeable to yearly UDS and controlled subs agreement - up to date.  Return in 3 months.  UDS due next due 10/10/23.  May have CBD on current one.

## 2022-10-10 NOTE — Progress Notes (Signed)
BP 113/77   Pulse 98   Temp 98.5 F (36.9 C) (Oral)   Wt 147 lb 12.8 oz (67 kg)   LMP 04/27/2020   SpO2 98%   BMI 28.87 kg/m    Subjective:    Patient ID: Jessica Cross, female    DOB: 06/21/1991, 32 y.o.   MRN: [id number]  HPI: Jessica Cross is a 32 y.o. female  Chief Complaint  Patient presents with   Depression   ADHD   Palpitations   ADHD FOLLOW UP Diagnosed by [facility] in November 2022 and started on Vyvanse 20 MG daily.  Currently is taking 30 MG daily, but still struggling with focus at times.  Last fill on 09/16/22. She continues Wellbutrin as well for mood, which offers benefit.  Is having some increased anxiety recently, panic attack in office today.  She is currently working two jobs.    In past has tried -- Celexa which caused severe panic attacks and took Prozac for 2-3 years, developed anxiety on this medication.  Her mother had depression years ago.  Had some CBD gummies recently to help her sleep. ADHD status: ongoing Satisfied with current therapy: yes Medication compliance:  good compliance Controlled substance contract: yes Previous psychiatry evaluation: yes Previous medications: Vyvanse   Taking meds on weekends/vacations: no Work/school performance:  good Difficulty sustaining attention/completing tasks:  yes Distracted by extraneous stimuli: yes Does not listen when spoken to: yes Fidgets with hands or feet: yes Unable to stay in seat: yes Blurts out/interrupts others: no ADHD Medication Side Effects: yes    Decreased appetite: no -- is taking Pepcid which helps with reflux    Headache: no    Sleeping disturbance pattern: no    Irritability: no    Rebound effects (worse than baseline) off medication: no    Anxiousness: no    Dizziness: no    Tics: no     10/10/2022    2:13 PM 07/07/2022    3:22 PM 04/04/2022    3:31 PM 02/05/2022    2:56 PM 01/15/2022    3:09 PM  Depression screen PHQ 2/9  Decreased Interest 1 0 1 1  1   Down, Depressed, Hopeless 1 0 1 1 1   PHQ - 2 Score 2 0 2 2 2   Altered sleeping 1 0 1 2 2   Tired, decreased energy 2 0 2 1 2   Change in appetite 0 0 1 0 0  Feeling bad or failure about yourself  1 0 1 1 1   Trouble concentrating 2 0 2 2 1   Moving slowly or fidgety/restless 0 0 0 0 0  Suicidal thoughts 0 0 0 0 0  PHQ-9 Score 8 0 9 8 8   Difficult doing work/chores Somewhat difficult Not difficult at all Somewhat difficult Somewhat difficult Somewhat difficult       10/10/2022    2:13 PM 07/07/2022    3:22 PM 04/04/2022    3:31 PM 02/05/2022    2:56 PM  GAD 7 : Generalized Anxiety Score  Nervous, Anxious, on Edge 2 1 2 1   Control/stop worrying 1 1 2 2   Worry too much - different things 2 1 2 2   Trouble relaxing 1 1 1 1   Restless 1 1 1 1   Easily annoyed or irritable 2 1 2 1   Afraid - awful might happen 2 1 1 1   Total GAD 7 Score 11 7 11 9   Anxiety Difficulty Somewhat difficult Somewhat difficult  Somewhat difficult  Relevant past medical, surgical, family and social history reviewed and updated as indicated. Interim medical history since our last visit reviewed. Allergies and medications reviewed and updated.  Review of Systems  Constitutional:  Negative for activity change, appetite change, chills, fatigue and fever.  Respiratory:  Negative for cough, chest tightness and wheezing.   Gastrointestinal: Negative.   Neurological: Negative.   Psychiatric/Behavioral: Negative.      Per HPI unless specifically indicated above     Objective:    BP 113/77   Pulse 98   Temp 98.5 F (36.9 C) (Oral)   Wt 147 lb 12.8 oz (67 kg)   LMP 04/27/2020   SpO2 98%   BMI 28.87 kg/m   Wt Readings from Last 3 Encounters:  10/10/22 147 lb 12.8 oz (67 kg)  09/30/22 146 lb 11.2 oz (66.5 kg)  04/04/22 145 lb 6.4 oz (66 kg)    Physical Exam Vitals and nursing note reviewed.  Constitutional:      General: She is awake. She is not in acute distress.    Appearance: She is well-developed  and well-groomed. She is not ill-appearing.  HENT:     Head: Normocephalic.     Right Ear: Hearing normal.     Left Ear: Hearing normal.  Eyes:     General: Lids are normal.        Right eye: No discharge.        Left eye: No discharge.     Conjunctiva/sclera: Conjunctivae normal.     Pupils: Pupils are equal, round, and reactive to light.  Neck:     Thyroid: No thyromegaly.     Vascular: No carotid bruit or JVD.  Cardiovascular:     Rate and Rhythm: Normal rate and regular rhythm.     Heart sounds: Normal heart sounds. No murmur heard.    No gallop.  Pulmonary:     Effort: Pulmonary effort is normal. No accessory muscle usage or respiratory distress.     Breath sounds: Normal breath sounds.  Abdominal:     General: Bowel sounds are normal.     Palpations: Abdomen is soft.  Musculoskeletal:     Cervical back: Normal range of motion and neck supple.     Right lower leg: No edema.     Left lower leg: No edema.  Lymphadenopathy:     Cervical: No cervical adenopathy.  Skin:    General: Skin is warm and dry.  Neurological:     Mental Status: She is alert and oriented to person, place, and time.  Psychiatric:        Attention and Perception: Attention normal.        Mood and Affect: Mood normal.        Speech: Speech normal.        Behavior: Behavior normal. Behavior is cooperative.        Thought Content: Thought content normal.   EKG My review and personal interpretation at Time: 1430    Indication: chest discomfort/panic  Rate: 98  Rhythm: sinus Axis: normal Other: No nonspecific st abn, no stemi, no lvh Results for orders placed or performed in visit on 09/30/22  CBC  Result Value Ref Range   WBC 5.8 3.4 - 10.8 x10E3/uL   RBC 4.52 3.77 - 5.28 x10E6/uL   Hemoglobin 13.8 11.1 - 15.9 g/dL   Hematocrit 82.9 56.2 - 46.6 %   MCV 90 79 - 97 fL   MCH 30.5 26.6 - 33.0 pg  MCHC 33.8 31.5 - 35.7 g/dL   RDW 60.4 54.0 - 98.1 %   Platelets 244 150 - 450 x10E3/uL   Comprehensive metabolic panel  Result Value Ref Range   Glucose 104 (H) 70 - 99 mg/dL   BUN 10 6 - 20 mg/dL   Creatinine, Ser 1.91 0.57 - 1.00 mg/dL   eGFR 95 >47 WG/NFA/2.13   BUN/Creatinine Ratio 12 9 - 23   Sodium 137 134 - 144 mmol/L   Potassium 4.9 3.5 - 5.2 mmol/L   Chloride 101 96 - 106 mmol/L   CO2 22 20 - 29 mmol/L   Calcium 10.2 8.7 - 10.2 mg/dL   Total Protein 7.4 6.0 - 8.5 g/dL   Albumin 4.9 3.9 - 4.9 g/dL   Globulin, Total 2.5 1.5 - 4.5 g/dL   Albumin/Globulin Ratio 2.0 1.2 - 2.2   Bilirubin Total 0.5 0.0 - 1.2 mg/dL   Alkaline Phosphatase 57 44 - 121 IU/L   AST 18 0 - 40 IU/L   ALT 12 0 - 32 IU/L  TSH  Result Value Ref Range   TSH 3.270 0.450 - 4.500 uIU/mL      Assessment & Plan:   Problem List Items Addressed This Visit       Other   ADHD (attention deficit hyperactivity disorder)    Chronic, ongoing.  Diagnosed at [facility] in November 2022. At this time continue Wellbutrin 150 MG XL daily as is offering benefit and she is fearful of stopping due to this.  Increase Vyvanse to 40 MG daily.  Refills on Vyvanse sent.  She is aware of office rules on this and agreeable to yearly UDS and controlled subs agreement - up to date.  Return in 3 months.  UDS due next due 10/10/23.  May have CBD on current one.      Relevant Orders   [id number] 11+Oxyco+Alc+Crt-Bund   Anxiety    Refer to depression plan of care.      Relevant Medications   busPIRone (BUSPAR) 10 MG tablet   Depression - Primary    Chronic, ongoing. At this time continue Wellbutrin 150 MG XL daily as is offering benefit, however made her aware of possibility for interactions with Vyvanse and her Metoprolol -- if these present I recommend stopping Wellbutrin and switching to an alternative such as SSRI or SNRI.  Add on Buspar 10 MG at night, this offered benefit for awhile in past and helped sleep.  She denies SI/HI.  May also benefit from therapy on regular basis.        Relevant  Medications   busPIRone (BUSPAR) 10 MG tablet   Other Visit Diagnoses     Heart palpitations       Reassuring EKG, panic attack in office today.   Relevant Orders   EKG 12-Lead (Completed)        Follow up plan: Return in about 3 months (around 01/09/2023) for MOOD AND ADHD.

## 2022-10-10 NOTE — Assessment & Plan Note (Signed)
Refer to depression plan of care. 

## 2022-10-10 NOTE — Assessment & Plan Note (Signed)
Chronic, ongoing. At this time continue Wellbutrin 150 MG XL daily as is offering benefit, however made her aware of possibility for interactions with Vyvanse and her Metoprolol -- if these present I recommend stopping Wellbutrin and switching to an alternative such as SSRI or SNRI.  Add on Buspar 10 MG at night, this offered benefit for awhile in past and helped sleep.  She denies SI/HI.  May also benefit from therapy on regular basis.

## 2022-10-16 LAB — DRUG SCREEN 764883 11+OXYCO+ALC+CRT-BUND
BENZODIAZ UR QL: NEGATIVE ng/mL
Barbiturate: NEGATIVE ng/mL
Cocaine (Metabolite): NEGATIVE ng/mL
Creatinine: 43.2 mg/dL (ref 20.0–300.0)
Ethanol: NEGATIVE %
Meperidine: NEGATIVE ng/mL
Methadone Screen, Urine: NEGATIVE ng/mL
OPIATE SCREEN URINE: NEGATIVE ng/mL
Oxycodone/Oxymorphone, Urine: NEGATIVE ng/mL
Phencyclidine: NEGATIVE ng/mL
Propoxyphene: NEGATIVE ng/mL
Tramadol: NEGATIVE ng/mL
pH, Urine: 5.3 (ref 4.5–8.9)

## 2022-10-16 LAB — DRUG PROFILE 799016
Amphetamine GC/MS Conf: >3000 ng/mL
Amphetamine: POSITIVE — AB
Amphetamines: POSITIVE — AB
Methamphetamine: NEGATIVE

## 2022-10-16 LAB — CANNABINOID CONFIRMATION, UR: CANNABINOIDS: NEGATIVE

## 2022-10-17 ENCOUNTER — Other Ambulatory Visit: Payer: Self-pay

## 2022-11-25 ENCOUNTER — Other Ambulatory Visit: Payer: Self-pay

## 2022-11-25 MED ORDER — TRETINOIN 0.05 % EX CREA
1.0000 | TOPICAL_CREAM | Freq: Every evening | CUTANEOUS | 2 refills | Status: AC
  Filled 2022-11-25: qty 20, 30d supply, fill #0
  Filled 2023-01-23: qty 20, 30d supply, fill #1
  Filled 2023-05-29: qty 20, 30d supply, fill #2

## 2023-01-07 ENCOUNTER — Other Ambulatory Visit: Payer: Self-pay

## 2023-01-09 ENCOUNTER — Encounter: Payer: Self-pay | Admitting: Nurse Practitioner

## 2023-01-09 ENCOUNTER — Telehealth (INDEPENDENT_AMBULATORY_CARE_PROVIDER_SITE_OTHER): Payer: 59 | Admitting: Nurse Practitioner

## 2023-01-09 ENCOUNTER — Ambulatory Visit: Payer: 59 | Admitting: Nurse Practitioner

## 2023-01-09 ENCOUNTER — Other Ambulatory Visit: Payer: Self-pay

## 2023-01-09 DIAGNOSIS — F331 Major depressive disorder, recurrent, moderate: Secondary | ICD-10-CM | POA: Diagnosis not present

## 2023-01-09 DIAGNOSIS — F902 Attention-deficit hyperactivity disorder, combined type: Secondary | ICD-10-CM

## 2023-01-09 DIAGNOSIS — F419 Anxiety disorder, unspecified: Secondary | ICD-10-CM

## 2023-01-09 MED ORDER — LISDEXAMFETAMINE DIMESYLATE 40 MG PO CAPS
40.0000 mg | ORAL_CAPSULE | ORAL | 0 refills | Status: DC
  Filled 2023-01-09 (×2): qty 30, 30d supply, fill #0
  Filled ????-??-??: fill #0

## 2023-01-09 MED ORDER — LISDEXAMFETAMINE DIMESYLATE 40 MG PO CAPS
40.0000 mg | ORAL_CAPSULE | ORAL | 0 refills | Status: DC
  Filled 2023-02-11: qty 30, 30d supply, fill #0

## 2023-01-09 MED ORDER — LISDEXAMFETAMINE DIMESYLATE 40 MG PO CAPS
40.0000 mg | ORAL_CAPSULE | ORAL | 0 refills | Status: DC
  Filled 2023-03-09 – 2023-03-24 (×4): qty 30, 30d supply, fill #0

## 2023-01-09 NOTE — Assessment & Plan Note (Signed)
Chronic, ongoing. Continue Wellbutrin 150 MG XL daily as is offering benefit, however made her aware of possibility for interactions with Vyvanse and her Metoprolol -- if these present I recommend stopping Wellbutrin and switching to an alternative such as SSRI or SNRI.  Continue Buspar 10 MG at night, this offered benefit for awhile in past and helped sleep.  She denies SI/HI.  May also benefit from therapy on regular basis.

## 2023-01-09 NOTE — Patient Instructions (Signed)
Blue Lizard for sensitive skin sunscreen and Eucerin has a sensitive skin sun screen  Attention Deficit Hyperactivity Disorder, Adult Attention deficit hyperactivity disorder (ADHD) is a mental health disorder that starts during childhood. For many people with ADHD, the disorder continues into the adult years. Treatment can help you manage your symptoms. There are three main types of ADHD: Inattentive. With this type, adults have difficulty paying attention. This may affect cognitive abilities. Hyperactive-impulsive. With this type, adults have a lot of energy and have difficulty controlling their behavior. Combination type. Some people may have symptoms of both types. What are the causes? The exact cause of ADHD is not known. Most experts believe a person's genes and environment possibly contribute to ADHD. What increases the risk? The following factors may make you more likely to develop this condition: Having a first-degree relative such as a parent, brother, or sister, with the condition. Being born before 37 weeks of pregnancy (prematurely) or at a low birth weight. Being born to a mother who smoked tobacco or drank alcohol during pregnancy. Having experienced a brain injury. Being exposed to lead or other toxins in the womb or early in life. What are the signs or symptoms? Symptoms of this condition depend on the type of ADHD. Symptoms of the inattentive type include: Difficulty paying attention or following instructions. Often making simple mistakes. Being disorganized. Avoiding tasks that require time and attention. Losing and forgetting things. Symptoms of the hyperactive-impulsive type include: Restlessness. Talking out of turn, interrupting others, or talking too much. Difficulty with: Sitting still. Feeling motivated. Relaxing. Waiting in line or waiting for a turn. People with the combination type have symptoms of both of the other types. In adults, this condition may  lead to certain problems, such as: Keeping jobs. Performing tasks at work. Having stable relationships. Being on time or keeping to a schedule. How is this diagnosed? This condition is diagnosed based on your current symptoms and your history of symptoms. The diagnosis can be made by a health care provider such as a primary care provider or a mental health care specialist. Your health care provider may use a symptom checklist or a behavior rating scale to evaluate your symptoms. Your health care provider may also want to talk with people who have observed your behaviors throughout your life. How is this treated? This condition can be treated with medicines and behavior therapy. Medicines may be the best option to reduce impulsive behaviors and improve attention. Your health care provider may recommend: Stimulant medicines. These are the most common medicines used for adult ADHD. They affect certain chemicals in the brain (neurotransmitters) and improve your ability to control your symptoms. A non-stimulant medicine. These medicines can also improve focus, attention, and impulsive behavior. It may take weeks to months to see the effects of this medicine. Counseling and behavioral management are also important for treating ADHD. Counseling is often used along with medicine. Your health care provider may suggest: Cognitive behavioral therapy (CBT). This type of therapy teaches you to replace negative thoughts and actions with positive thoughts and actions. When used as part of ADHD treatment, this therapy may also include: Coping strategies for organization, time management, impulse control, and stress reduction. Mindfulness and meditation training. Behavioral management. You may work with a coach who is specially trained to help people with ADHD manage and organize activities and function more effectively. Follow these instructions at home: Medicines  Take over-the-counter and prescription  medicines only as told by your health care provider.   Talk with your health care provider about the possible side effects of your medicines and how to manage them. Alcohol use Do not drink alcohol if: Your health care provider tells you not to drink. You are pregnant, may be pregnant, or are planning to become pregnant. If you drink alcohol: Limit how much you use to: 0-1 drink a day for women. 0-2 drinks a day for men. Know how much alcohol is in your drink. In the [place] one drink equals one 12 oz bottle of beer (355 mL), one 5 oz glass of wine (148 mL), or one 1 oz glass of hard liquor (44 mL). Lifestyle  Do not use illegal drugs. Get enough sleep. Eat a healthy diet. Exercise regularly. Exercise can help to reduce stress and anxiety. General instructions Learn as much as you can about adult ADHD, and work closely with your health care providers to find the treatments that work best for you. Follow the same schedule each day. Use reminder devices like notes, calendars, and phone apps to stay on time and organized. Keep all follow-up visits. Your health care provider will need to monitor your condition and adjust your treatment over time. Where to find more information A health care provider may be able to recommend resources that are available online or over the phone. You could start with: Attention Deficit Disorder Association (ADDA): add.org National Institute of Mental Health (NIMH): nimh.nih.gov Contact a health care provider if: Your symptoms continue to cause problems. You have side effects from your medicine, such as: Repeated muscle twitches, coughing, or speech outbursts. Sleep problems. Loss of appetite. Dizziness. Unusually fast heartbeat. Stomach pains. Headaches. You are struggling with anxiety, depression, or substance abuse. Get help right away if: You have a severe reaction to a medicine. This symptom may be an emergency. Get help right away. Call 911. Do  not wait to see if the symptom will go away. Do not drive yourself to the hospital. Take one of these steps if you feel like you may hurt yourself or others, or have thoughts about taking your own life: Go to your nearest emergency room. Call 911. Call the National Suicide Prevention Lifeline at 1-800-273-8255 or 988. This is open 24 hours a day Text the Crisis Text Line at 741741. Summary ADHD is a mental health disorder that starts during childhood and often continues into your adult years. The exact cause of ADHD is not known. Most experts believe genetics and environmental factors contribute to ADHD. There is no cure for ADHD, but treatment with medicine, cognitive behavioral therapy, or behavioral management can help you manage your condition. This information is not intended to replace advice given to you by your health care provider. Make sure you discuss any questions you have with your health care provider. Document Revised: 09/27/2021 Document Reviewed: 09/27/2021 Elsevier Patient Education  2024 [employer]vier Inc.  

## 2023-01-09 NOTE — Progress Notes (Signed)
Appoint has been made

## 2023-01-09 NOTE — Assessment & Plan Note (Signed)
Refer to depression plan of care. 

## 2023-01-09 NOTE — Progress Notes (Signed)
LMP 04/27/2020    Subjective:    Patient ID: Jessica Cross, female    DOB: [birth year], 32 y.o.   MRN: [id number]  HPI: Jessica Cross is a 32 y.o. female  Chief Complaint  Patient presents with   ADHD   This visit was completed via video visit through MyChart due to the restrictions of the COVID-19 pandemic. All issues as above were discussed and addressed. Physical exam was done as above through visual confirmation on video through MyChart. If it was felt that the patient should be evaluated in the office, they were directed there. The patient verbally consented to this visit. Location of the patient: home Location of the provider: work Those involved with this call:  Provider: Aura Dials, DNP CMA: Tristan Schroeder, CMA Front Desk/Registration: Kandice Hams Time spent on call:  21 minutes with patient face to face via video conference. More than 50% of this time was spent in counseling and coordination of care. 15 minutes total spent in review of patient's record and preparation of their chart.  I verified patient identity using two factors (patient name and date of birth). Patient consents verbally to being seen via telemedicine visit today.    ADHD FOLLOW UP Diagnosed by [facility] in November 2022 and started on Vyvanse 40 MG daily -- last 3 months have been much better.  Currently is taking 30 MG daily, as increased by PCP past visit.   Last fill on 11/25/22.  Continues Wellbutrin + Buspar as well for mood, which offers benefit -- she states she will always have a little depression and anxiety but overall has been better past 3 months. ADHD status: ongoing Satisfied with current therapy: yes Medication compliance:  good compliance Controlled substance contract: yes Previous psychiatry evaluation: yes Previous medications: Vyvanse   Taking meds on weekends/vacations:  sometimes Work/school performance:  good Difficulty sustaining attention/completing tasks:   no Distracted by extraneous stimuli: no Does not listen when spoken to: sometimes Fidgets with hands or feet: yes Unable to stay in seat: no Blurts out/interrupts others: no ADHD Medication Side Effects: yes    Decreased appetite: no     Headache: no    Sleeping disturbance pattern: no    Irritability: no    Rebound effects (worse than baseline) off medication: no    Anxiousness: no    Dizziness: no    Tics: no     01/09/2023    2:13 PM 10/10/2022    2:13 PM 07/07/2022    3:22 PM 04/04/2022    3:31 PM 02/05/2022    2:56 PM  Depression screen PHQ 2/9  Decreased Interest 2 1 0 1 1  Down, Depressed, Hopeless 1 1 0 1 1  PHQ - 2 Score 3 2 0 2 2  Altered sleeping 3 1 0 1 2  Tired, decreased energy 2 2 0 2 1  Change in appetite 0 0 0 1 0  Feeling bad or failure about yourself  0 1 0 1 1  Trouble concentrating 2 2 0 2 2  Moving slowly or fidgety/restless 0 0 0 0 0  Suicidal thoughts 0 0 0 0 0  PHQ-9 Score 10 8 0 9 8  Difficult doing work/chores Not difficult at all Somewhat difficult Not difficult at all Somewhat difficult Somewhat difficult       01/09/2023    2:15 PM 10/10/2022    2:13 PM 07/07/2022    3:22 PM 04/04/2022    3:31 PM  GAD 7 : Generalized Anxiety Score  Nervous, Anxious, on Edge 2 2 1 2   Control/stop worrying 2 1 1 2   Worry too much - different things 1 2 1 2   Trouble relaxing 1 1 1 1   Restless 1 1 1 1   Easily annoyed or irritable 2 2 1 2   Afraid - awful might happen 2 2 1 1   Total GAD 7 Score 11 11 7 11   Anxiety Difficulty Somewhat difficult Somewhat difficult Somewhat difficult    Relevant past medical, surgical, family and social history reviewed and updated as indicated. Interim medical history since our last visit reviewed. Allergies and medications reviewed and updated.  Review of Systems  Constitutional:  Negative for activity change, appetite change, chills, fatigue and fever.  Respiratory:  Negative for cough, chest tightness and wheezing.    Gastrointestinal: Negative.   Neurological: Negative.   Psychiatric/Behavioral: Negative.      Per HPI unless specifically indicated above     Objective:    LMP 04/27/2020   Wt Readings from Last 3 Encounters:  10/10/22 147 lb 12.8 oz (67 kg)  09/30/22 146 lb 11.2 oz (66.5 kg)  04/04/22 145 lb 6.4 oz (66 kg)    Physical Exam Vitals and nursing note reviewed.  Constitutional:      General: She is awake. She is not in acute distress.    Appearance: She is well-developed. She is not ill-appearing.  HENT:     Head: Normocephalic.     Right Ear: Hearing normal.     Left Ear: Hearing normal.  Eyes:     General: Lids are normal.        Right eye: No discharge.        Left eye: No discharge.     Conjunctiva/sclera: Conjunctivae normal.  Pulmonary:     Effort: Pulmonary effort is normal. No accessory muscle usage or respiratory distress.  Musculoskeletal:     Cervical back: Normal range of motion.  Neurological:     Mental Status: She is alert and oriented to person, place, and time.  Psychiatric:        Attention and Perception: Attention normal.        Mood and Affect: Mood normal.        Behavior: Behavior normal. Behavior is cooperative.        Thought Content: Thought content normal.        Judgment: Judgment normal.    Results for orders placed or performed in visit on 10/10/22  [id number] 11+Oxyco+Alc+Crt-Bund  Result Value Ref Range   Ethanol Negative Cutoff=0.020 %   Amphetamines, Urine See Final Results Cutoff=1000 ng/mL   Barbiturate Negative Cutoff=200 ng/mL   BENZODIAZ UR QL Negative Cutoff=200 ng/mL   Cannabinoid Quant, Ur See Final Results Cutoff=50 ng/mL   Cocaine (Metabolite) Negative Cutoff=300 ng/mL   OPIATE SCREEN URINE Negative Cutoff=300 ng/mL   Oxycodone/Oxymorphone, Urine Negative Cutoff=300 ng/mL   Phencyclidine Negative Cutoff=25 ng/mL   Methadone Screen, Urine Negative Cutoff=300 ng/mL   Propoxyphene Negative Cutoff=300 ng/mL   Meperidine  Negative Cutoff=200 ng/mL   Tramadol Negative Cutoff=200 ng/mL   Creatinine 43.2 20.0 - 300.0 mg/dL   pH, Urine 5.3 4.5 - 8.9  Drug Profile [phone]  Result Value Ref Range   Amphetamines Positive (A) Cutoff=1000   Amphetamine Positive (A)    Amphetamine GC/MS Conf >3000 Cutoff=500 ng/mL   Methamphetamine Negative Cutoff=500  Cannabinoid Conf, Ur  Result Value Ref Range   CANNABINOIDS Negative Cutoff=50  Assessment & Plan:   Problem List Items Addressed This Visit       Other   ADHD (attention deficit hyperactivity disorder)    Chronic, ongoing.  Diagnosed at [facility] in November 2022. At this time continue Wellbutrin 150 MG XL daily as is offering benefit and she is fearful of stopping due to this.  Continue Vyvanse 40 MG daily.  Refills on Vyvanse sent.  She is aware of office rules on this and agreeable to yearly UDS and controlled subs agreement - up to date.  Return in 3 months.  UDS due next due 10/10/23.        Anxiety    Refer to depression plan of care.      Depression - Primary    Chronic, ongoing. Continue Wellbutrin 150 MG XL daily as is offering benefit, however made her aware of possibility for interactions with Vyvanse and her Metoprolol -- if these present I recommend stopping Wellbutrin and switching to an alternative such as SSRI or SNRI.  Continue Buspar 10 MG at night, this offered benefit for awhile in past and helped sleep.  She denies SI/HI.  May also benefit from therapy on regular basis.          I discussed the assessment and treatment plan with the patient. The patient was provided an opportunity to ask questions and all were answered. The patient agreed with the plan and demonstrated an understanding of the instructions.   The patient was advised to call back or seek an in-person evaluation if the symptoms worsen or if the condition fails to improve as anticipated.   I provided 21+ minutes of time during this encounter.     Follow up plan: Return in about 4 months (around 05/12/2023) for Annual physical.

## 2023-01-09 NOTE — Assessment & Plan Note (Signed)
Chronic, ongoing.  Diagnosed at [facility] in November 2022. At this time continue Wellbutrin 150 MG XL daily as is offering benefit and she is fearful of stopping due to this.  Continue Vyvanse 40 MG daily.  Refills on Vyvanse sent.  She is aware of office rules on this and agreeable to yearly UDS and controlled subs agreement - up to date.  Return in 3 months.  UDS due next due 10/10/23.

## 2023-02-12 ENCOUNTER — Other Ambulatory Visit: Payer: Self-pay

## 2023-02-25 ENCOUNTER — Encounter: Payer: Self-pay | Admitting: Dermatology

## 2023-02-25 ENCOUNTER — Ambulatory Visit: Payer: 59 | Admitting: Dermatology

## 2023-02-25 DIAGNOSIS — D229 Melanocytic nevi, unspecified: Secondary | ICD-10-CM

## 2023-02-25 NOTE — Progress Notes (Signed)
   Follow-Up Visit   Subjective  Jessica Cross is a 32 y.o. female who presents for the following: Patient c/o a brown spot on her left chest she recently noticed, she would like examined today.  The patient has spots, moles and lesions to be evaluated, some may be new or changing and the patient may have concern these could be cancer.   The following portions of the chart were reviewed this encounter and updated as appropriate: medications, allergies, medical history  Review of Systems:  No other skin or systemic complaints except as noted in HPI or Assessment and Plan.  Objective  Well appearing patient in no apparent distress; mood and affect are within normal limits.  A focused examination was performed of the following areas:chest, abdomen   Relevant physical exam findings are noted in the Assessment and Plan.         Assessment & Plan   NEVUS  Left upper abdomen 5 x 4 mm (thicker portion) and 5 x 2 mm (thinner portion) pigmented macule   Benign appearing on exam today. Recommend observation. Call clinic for new or changing moles. Recommend daily use of broad spectrum spf 30+ sunscreen to sun-exposed areas.    Photo taken  Recheck in 6 months  Return in about 6 months (around 08/25/2023) for TBSE.  IAngelique Holm, CMA, am acting as scribe for Elie Goody, MD .   Documentation: I have reviewed the above documentation for accuracy and completeness, and I agree with the above.  Elie Goody, MD

## 2023-02-25 NOTE — Patient Instructions (Signed)

## 2023-03-10 ENCOUNTER — Other Ambulatory Visit: Payer: Self-pay

## 2023-03-13 ENCOUNTER — Other Ambulatory Visit: Payer: Self-pay

## 2023-03-24 ENCOUNTER — Other Ambulatory Visit: Payer: Self-pay

## 2023-04-24 ENCOUNTER — Other Ambulatory Visit: Payer: Self-pay

## 2023-04-24 ENCOUNTER — Ambulatory Visit: Payer: 59 | Admitting: Nurse Practitioner

## 2023-04-24 ENCOUNTER — Encounter: Payer: Self-pay | Admitting: Nurse Practitioner

## 2023-04-24 VITALS — BP 121/77 | HR 94 | Temp 98.1°F | Ht 60.75 in | Wt 151.6 lb

## 2023-04-24 DIAGNOSIS — F419 Anxiety disorder, unspecified: Secondary | ICD-10-CM | POA: Diagnosis not present

## 2023-04-24 DIAGNOSIS — F331 Major depressive disorder, recurrent, moderate: Secondary | ICD-10-CM

## 2023-04-24 DIAGNOSIS — F902 Attention-deficit hyperactivity disorder, combined type: Secondary | ICD-10-CM | POA: Diagnosis not present

## 2023-04-24 MED ORDER — AMPHETAMINE-DEXTROAMPHET ER 30 MG PO CP24: 30.0000 mg | ORAL_CAPSULE | ORAL | 0 refills | Status: DC

## 2023-04-24 MED ORDER — AMPHETAMINE-DEXTROAMPHET ER 30 MG PO CP24
30.0000 mg | ORAL_CAPSULE | ORAL | 0 refills | Status: DC
  Filled 2023-04-24: qty 30, 30d supply, fill #0

## 2023-04-24 NOTE — Assessment & Plan Note (Signed)
Refer to depression plan of care. 

## 2023-04-24 NOTE — Patient Instructions (Signed)
Blue Lizard for sensitive skin sunscreen and Eucerin has a sensitive skin sun screen  Attention Deficit Hyperactivity Disorder, Adult Attention deficit hyperactivity disorder (ADHD) is a mental health disorder that starts during childhood. For many people with ADHD, the disorder continues into the adult years. Treatment can help you manage your symptoms. There are three main types of ADHD: Inattentive. With this type, adults have difficulty paying attention. This may affect cognitive abilities. Hyperactive-impulsive. With this type, adults have a lot of energy and have difficulty controlling their behavior. Combination type. Some people may have symptoms of both types. What are the causes? The exact cause of ADHD is not known. Most experts believe a person's genes and environment possibly contribute to ADHD. What increases the risk? The following factors may make you more likely to develop this condition: Having a first-degree relative such as a parent, brother, or sister, with the condition. Being born before 37 weeks of pregnancy (prematurely) or at a low birth weight. Being born to a mother who smoked tobacco or drank alcohol during pregnancy. Having experienced a brain injury. Being exposed to lead or other toxins in the womb or early in life. What are the signs or symptoms? Symptoms of this condition depend on the type of ADHD. Symptoms of the inattentive type include: Difficulty paying attention or following instructions. Often making simple mistakes. Being disorganized. Avoiding tasks that require time and attention. Losing and forgetting things. Symptoms of the hyperactive-impulsive type include: Restlessness. Talking out of turn, interrupting others, or talking too much. Difficulty with: Sitting still. Feeling motivated. Relaxing. Waiting in line or waiting for a turn. People with the combination type have symptoms of both of the other types. In adults, this condition may  lead to certain problems, such as: Keeping jobs. Performing tasks at work. Having stable relationships. Being on time or keeping to a schedule. How is this diagnosed? This condition is diagnosed based on your current symptoms and your history of symptoms. The diagnosis can be made by a health care provider such as a primary care provider or a mental health care specialist. Your health care provider may use a symptom checklist or a behavior rating scale to evaluate your symptoms. Your health care provider may also want to talk with people who have observed your behaviors throughout your life. How is this treated? This condition can be treated with medicines and behavior therapy. Medicines may be the best option to reduce impulsive behaviors and improve attention. Your health care provider may recommend: Stimulant medicines. These are the most common medicines used for adult ADHD. They affect certain chemicals in the brain (neurotransmitters) and improve your ability to control your symptoms. A non-stimulant medicine. These medicines can also improve focus, attention, and impulsive behavior. It may take weeks to months to see the effects of this medicine. Counseling and behavioral management are also important for treating ADHD. Counseling is often used along with medicine. Your health care provider may suggest: Cognitive behavioral therapy (CBT). This type of therapy teaches you to replace negative thoughts and actions with positive thoughts and actions. When used as part of ADHD treatment, this therapy may also include: Coping strategies for organization, time management, impulse control, and stress reduction. Mindfulness and meditation training. Behavioral management. You may work with a coach who is specially trained to help people with ADHD manage and organize activities and function more effectively. Follow these instructions at home: Medicines  Take over-the-counter and prescription  medicines only as told by your health care provider.   Talk with your health care provider about the possible side effects of your medicines and how to manage them. Alcohol use Do not drink alcohol if: Your health care provider tells you not to drink. You are pregnant, may be pregnant, or are planning to become pregnant. If you drink alcohol: Limit how much you use to: 0-1 drink a day for women. 0-2 drinks a day for men. Know how much alcohol is in your drink. In the [place] one drink equals one 12 oz bottle of beer (355 mL), one 5 oz glass of wine (148 mL), or one 1 oz glass of hard liquor (44 mL). Lifestyle  Do not use illegal drugs. Get enough sleep. Eat a healthy diet. Exercise regularly. Exercise can help to reduce stress and anxiety. General instructions Learn as much as you can about adult ADHD, and work closely with your health care providers to find the treatments that work best for you. Follow the same schedule each day. Use reminder devices like notes, calendars, and phone apps to stay on time and organized. Keep all follow-up visits. Your health care provider will need to monitor your condition and adjust your treatment over time. Where to find more information A health care provider may be able to recommend resources that are available online or over the phone. You could start with: Attention Deficit Disorder Association (ADDA): add.org National Institute of Mental Health (NIMH): nimh.nih.gov Contact a health care provider if: Your symptoms continue to cause problems. You have side effects from your medicine, such as: Repeated muscle twitches, coughing, or speech outbursts. Sleep problems. Loss of appetite. Dizziness. Unusually fast heartbeat. Stomach pains. Headaches. You are struggling with anxiety, depression, or substance abuse. Get help right away if: You have a severe reaction to a medicine. This symptom may be an emergency. Get help right away. Call 911. Do  not wait to see if the symptom will go away. Do not drive yourself to the hospital. Take one of these steps if you feel like you may hurt yourself or others, or have thoughts about taking your own life: Go to your nearest emergency room. Call 911. Call the National Suicide Prevention Lifeline at 1-800-273-8255 or 988. This is open 24 hours a day Text the Crisis Text Line at 741741. Summary ADHD is a mental health disorder that starts during childhood and often continues into your adult years. The exact cause of ADHD is not known. Most experts believe genetics and environmental factors contribute to ADHD. There is no cure for ADHD, but treatment with medicine, cognitive behavioral therapy, or behavioral management can help you manage your condition. This information is not intended to replace advice given to you by your health care provider. Make sure you discuss any questions you have with your health care provider. Document Revised: 09/27/2021 Document Reviewed: 09/27/2021 Elsevier Patient Education  2024 [employer]vier Inc.  

## 2023-04-24 NOTE — Assessment & Plan Note (Signed)
Chronic, ongoing.  Diagnosed at [facility] in November 2022. At this time continue Wellbutrin 150 MG XL daily as is offering benefit and she is fearful of stopping due to this.  Can no longer take Vyvanse due to cost with new insurance. Will trial changing over to Adderall XR 30 MG daily and see if comparable benefit, is less cost for her.  She is aware of office rules on this and agreeable to yearly UDS and controlled subs agreement - up to date.  Return in 3 months.  UDS due next due 10/10/23.

## 2023-04-24 NOTE — Progress Notes (Signed)
BP 121/77 (BP Location: Left Arm, Patient Position: Sitting, Cuff Size: Normal)   Pulse 94   Temp 98.1 F (36.7 C) (Oral)   Ht 5' 0.75" (1.543 m)   Wt 151 lb 9.6 oz (68.8 kg)   LMP 04/27/2020   SpO2 99%   BMI 28.88 kg/m    Subjective:    Patient ID: Jessica Cross, female    DOB: 1990/10/09, 32 y.o.   MRN: [id number]  HPI: Jessica Cross is a 32 y.o. female  Chief Complaint  Patient presents with   vyvanse follow up    Patient states everything has been going good, but would like to switch it up due to insurance increase cost with vyvanse    ADHD FOLLOW UP Diagnosed by [facility] in November 2022 and started on Vyvanse 40 MG daily.  She is in need of switching medications due to change in insurance changes and now Vyvanse $330 for generic. Last fill on 03/24/23.  Continues Wellbutrin + Buspar as well for mood, which offers benefit -- she states she will always have a little depression and anxiety, little extra stress recently ADHD status: ongoing Satisfied with current therapy: yes Medication compliance:  good compliance Controlled substance contract: yes Previous psychiatry evaluation: yes Previous medications: Vyvanse   Taking meds on weekends/vacations:  sometimes Work/school performance:  good Difficulty sustaining attention/completing tasks:  no Distracted by extraneous stimuli: no Does not listen when spoken to: sometimes Fidgets with hands or feet: yes Unable to stay in seat: no Blurts out/interrupts others: no ADHD Medication Side Effects: yes    Decreased appetite: no     Headache: no    Sleeping disturbance pattern: no    Irritability: no    Rebound effects (worse than baseline) off medication: no    Anxiousness: no    Dizziness: no    Tics: no     04/24/2023    3:33 PM 01/09/2023    2:13 PM 10/10/2022    2:13 PM 07/07/2022    3:22 PM 04/04/2022    3:31 PM  Depression screen PHQ 2/9  Decreased Interest 1 2 1  0 1  Down, Depressed,  Hopeless 1 1 1  0 1  PHQ - 2 Score 2 3 2  0 2  Altered sleeping 1 3 1  0 1  Tired, decreased energy 2 2 2  0 2  Change in appetite 0 0 0 0 1  Feeling bad or failure about yourself  1 0 1 0 1  Trouble concentrating 2 2 2  0 2  Moving slowly or fidgety/restless 0 0 0 0 0  Suicidal thoughts 0 0 0 0 0  PHQ-9 Score 8 10 8  0 9  Difficult doing work/chores  Not difficult at all Somewhat difficult Not difficult at all Somewhat difficult       04/24/2023    3:33 PM 01/09/2023    2:15 PM 10/10/2022    2:13 PM 07/07/2022    3:22 PM  GAD 7 : Generalized Anxiety Score  Nervous, Anxious, on Edge 2 2 2 1   Control/stop worrying 2 2 1 1   Worry too much - different things 2 1 2 1   Trouble relaxing 1 1 1 1   Restless 1 1 1 1   Easily annoyed or irritable 2 2 2 1   Afraid - awful might happen 2 2 2 1   Total GAD 7 Score 12 11 11 7   Anxiety Difficulty  Somewhat difficult Somewhat difficult Somewhat difficult   Relevant past medical, surgical,  family and social history reviewed and updated as indicated. Interim medical history since our last visit reviewed. Allergies and medications reviewed and updated.  Review of Systems  Constitutional:  Negative for activity change, appetite change, chills, fatigue and fever.  Respiratory:  Negative for cough, chest tightness and wheezing.   Gastrointestinal: Negative.   Neurological: Negative.   Psychiatric/Behavioral:  Positive for decreased concentration. Negative for self-injury, sleep disturbance and suicidal ideas. The patient is nervous/anxious.     Per HPI unless specifically indicated above     Objective:    BP 121/77 (BP Location: Left Arm, Patient Position: Sitting, Cuff Size: Normal)   Pulse 94   Temp 98.1 F (36.7 C) (Oral)   Ht 5' 0.75" (1.543 m)   Wt 151 lb 9.6 oz (68.8 kg)   LMP 04/27/2020   SpO2 99%   BMI 28.88 kg/m   Wt Readings from Last 3 Encounters:  04/24/23 151 lb 9.6 oz (68.8 kg)  10/10/22 147 lb 12.8 oz (67 kg)  09/30/22 146 lb  11.2 oz (66.5 kg)    Physical Exam Vitals and nursing note reviewed.  Constitutional:      General: She is awake. She is not in acute distress.    Appearance: She is well-developed and well-groomed. She is not ill-appearing or toxic-appearing.  HENT:     Head: Normocephalic.     Right Ear: Hearing and external ear normal.     Left Ear: Hearing and external ear normal.  Eyes:     General: Lids are normal.        Right eye: No discharge.        Left eye: No discharge.     Conjunctiva/sclera: Conjunctivae normal.     Pupils: Pupils are equal, round, and reactive to light.  Neck:     Thyroid: No thyromegaly.     Vascular: No carotid bruit.  Cardiovascular:     Rate and Rhythm: Normal rate and regular rhythm.     Heart sounds: Normal heart sounds. No murmur heard.    No gallop.  Pulmonary:     Effort: Pulmonary effort is normal. No accessory muscle usage or respiratory distress.     Breath sounds: Normal breath sounds.  Abdominal:     General: Bowel sounds are normal. There is no distension.     Palpations: Abdomen is soft.     Tenderness: There is no abdominal tenderness.  Musculoskeletal:     Cervical back: Normal range of motion and neck supple.     Right lower leg: No edema.     Left lower leg: No edema.  Lymphadenopathy:     Cervical: No cervical adenopathy.  Skin:    General: Skin is warm and dry.  Neurological:     Mental Status: She is alert and oriented to person, place, and time.     Deep Tendon Reflexes: Reflexes are normal and symmetric.     Reflex Scores:      Brachioradialis reflexes are 2+ on the right side and 2+ on the left side.      Patellar reflexes are 2+ on the right side and 2+ on the left side. Psychiatric:        Attention and Perception: Attention normal.        Mood and Affect: Mood normal.        Speech: Speech normal.        Behavior: Behavior normal. Behavior is cooperative.        Thought Content: Thought  content normal.     Results for  orders placed or performed in visit on 10/10/22  [id number] 11+Oxyco+Alc+Crt-Bund  Result Value Ref Range   Ethanol Negative Cutoff=0.020 %   Amphetamines, Urine See Final Results Cutoff=1000 ng/mL   Barbiturate Negative Cutoff=200 ng/mL   BENZODIAZ UR QL Negative Cutoff=200 ng/mL   Cannabinoid Quant, Ur See Final Results Cutoff=50 ng/mL   Cocaine (Metabolite) Negative Cutoff=300 ng/mL   OPIATE SCREEN URINE Negative Cutoff=300 ng/mL   Oxycodone/Oxymorphone, Urine Negative Cutoff=300 ng/mL   Phencyclidine Negative Cutoff=25 ng/mL   Methadone Screen, Urine Negative Cutoff=300 ng/mL   Propoxyphene Negative Cutoff=300 ng/mL   Meperidine Negative Cutoff=200 ng/mL   Tramadol Negative Cutoff=200 ng/mL   Creatinine 43.2 20.0 - 300.0 mg/dL   pH, Urine 5.3 4.5 - 8.9  Drug Profile [phone]  Result Value Ref Range   Amphetamines Positive (A) Cutoff=1000   Amphetamine Positive (A)    Amphetamine GC/MS Conf >3000 Cutoff=500 ng/mL   Methamphetamine Negative Cutoff=500  Cannabinoid Conf, Ur  Result Value Ref Range   CANNABINOIDS Negative Cutoff=50      Assessment & Plan:   Problem List Items Addressed This Visit       Other   ADHD (attention deficit hyperactivity disorder)    Chronic, ongoing.  Diagnosed at [facility] in November 2022. At this time continue Wellbutrin 150 MG XL daily as is offering benefit and she is fearful of stopping due to this.  Can no longer take Vyvanse due to cost with new insurance. Will trial changing over to Adderall XR 30 MG daily and see if comparable benefit, is less cost for her.  She is aware of office rules on this and agreeable to yearly UDS and controlled subs agreement - up to date.  Return in 3 months.  UDS due next due 10/10/23.        Anxiety    Refer to depression plan of care.      Depression - Primary    Chronic, ongoing. Continue Wellbutrin 150 MG XL daily as is offering benefit, however made her aware of possibility for  interactions with her Metoprolol -- if these present I recommend stopping Wellbutrin and switching to an alternative such as SSRI or SNRI.  Continue Buspar 10 MG at night, this offered benefit for awhile in past and helped sleep.  She denies SI/HI.  May also benefit from therapy on regular basis.          Follow up plan: Return in about 3 months (around 07/25/2023) for ADHD -- virtual.

## 2023-04-24 NOTE — Assessment & Plan Note (Signed)
Chronic, ongoing. Continue Wellbutrin 150 MG XL daily as is offering benefit, however made her aware of possibility for interactions with her Metoprolol -- if these present I recommend stopping Wellbutrin and switching to an alternative such as SSRI or SNRI.  Continue Buspar 10 MG at night, this offered benefit for awhile in past and helped sleep.  She denies SI/HI.  May also benefit from therapy on regular basis.

## 2023-04-27 ENCOUNTER — Encounter: Payer: Self-pay | Admitting: Nurse Practitioner

## 2023-04-28 ENCOUNTER — Other Ambulatory Visit: Payer: Self-pay

## 2023-04-28 MED ORDER — AMPHETAMINE-DEXTROAMPHET ER 15 MG PO CP24
15.0000 mg | ORAL_CAPSULE | ORAL | 0 refills | Status: DC
  Filled 2023-04-28 – 2023-05-29 (×2): qty 30, 30d supply, fill #0

## 2023-04-28 NOTE — Addendum Note (Signed)
Addended by: Aura Dials T on: 04/28/2023 11:23 AM   Modules accepted: Orders

## 2023-05-12 ENCOUNTER — Other Ambulatory Visit: Payer: Self-pay

## 2023-05-14 ENCOUNTER — Other Ambulatory Visit: Payer: Self-pay

## 2023-05-15 ENCOUNTER — Encounter: Payer: 59 | Admitting: Nurse Practitioner

## 2023-05-29 ENCOUNTER — Other Ambulatory Visit: Payer: Self-pay

## 2023-06-29 ENCOUNTER — Other Ambulatory Visit: Payer: Self-pay | Admitting: Nurse Practitioner

## 2023-06-30 ENCOUNTER — Other Ambulatory Visit: Payer: Self-pay

## 2023-06-30 MED ORDER — AMPHETAMINE-DEXTROAMPHET ER 15 MG PO CP24
15.0000 mg | ORAL_CAPSULE | ORAL | 0 refills | Status: DC
  Filled 2023-06-30: qty 30, 30d supply, fill #0

## 2023-06-30 NOTE — Telephone Encounter (Signed)
 Requested medication (s) are due for refill today - yes  Requested medication (s) are on the active medication list -yes  Future visit scheduled -yes  Last refill: 04/28/23 #30   Notes to clinic: non delegated Rx  Requested Prescriptions  Pending Prescriptions Disp Refills   amphetamine -dextroamphetamine  (ADDERALL XR) 15 MG 24 hr capsule 30 capsule 0    Sig: Take 1 capsule by mouth every morning.     Not Delegated - Psychiatry:  Stimulants/ADHD Failed - 06/30/2023  9:11 AM      Failed - This refill cannot be delegated      Passed - Urine Drug Screen completed in last 360 days      Passed - Last BP in normal range    BP Readings from Last 1 Encounters:  04/24/23 121/77         Passed - Last Heart Rate in normal range    Pulse Readings from Last 1 Encounters:  04/24/23 94         Passed - Valid encounter within last 6 months    Recent Outpatient Visits           2 months ago Moderate episode of recurrent major depressive disorder (HCC)   [place] [facility] [place], Jolene T, NP   5 months ago Moderate episode of recurrent major depressive disorder (HCC)   [place] [facility] [place], Jolene T, NP   8 months ago Moderate episode of recurrent major depressive disorder (HCC)   [place] [facility] [place], Jolene T, NP   11 months ago Moderate episode of recurrent major depressive disorder (HCC)   [place] [facility] [place], Hortense T, NP   1 year ago Moderate episode of recurrent major depressive disorder (HCC)   [place] [facility] [place], Melanie DASEN, NP       Future Appointments             In 3 weeks [place], Jolene T, NP [place] [facility] [place]   In 1 month Claudene Lehmann, MD [facility]   In 1 month [place], [place] T, NP [place] [facility], [facility]               Requested Prescriptions  Pending Prescriptions Disp  Refills   amphetamine -dextroamphetamine  (ADDERALL XR) 15 MG 24 hr capsule 30 capsule 0    Sig: Take 1 capsule by mouth every morning.     Not Delegated - Psychiatry:  Stimulants/ADHD Failed - 06/30/2023  9:11 AM      Failed - This refill cannot be delegated      Passed - Urine Drug Screen completed in last 360 days      Passed - Last BP in normal range    BP Readings from Last 1 Encounters:  04/24/23 121/77         Passed - Last Heart Rate in normal range    Pulse Readings from Last 1 Encounters:  04/24/23 94         Passed - Valid encounter within last 6 months    Recent Outpatient Visits           2 months ago Moderate episode of recurrent major depressive disorder (HCC)   [place] [facility] [place], Jolene T, NP   5 months ago Moderate episode of recurrent major depressive disorder (HCC)    [facility] [place], Melanie T, NP   8 months  ago Moderate episode of recurrent major depressive disorder (HCC)   [place] [facility] [place], Marcelline T, NP   11 months ago Moderate episode of recurrent major depressive disorder (HCC)   [place] [facility] [place], Melanie T, NP   1 year ago Moderate episode of recurrent major depressive disorder (HCC)   [place] [facility] [place], Melanie DASEN, NP       Future Appointments             In 3 weeks Cannady, Jolene T, NP [place] [facility], [facility]   In 1 month Claudene Lehmann, MD [facility]   In 1 month [place], Melanie DASEN, NP  [facility], [facility]

## 2023-07-26 NOTE — Patient Instructions (Signed)

## 2023-07-27 ENCOUNTER — Telehealth: Payer: 59 | Admitting: Nurse Practitioner

## 2023-07-27 ENCOUNTER — Other Ambulatory Visit: Payer: Self-pay

## 2023-07-27 ENCOUNTER — Encounter: Payer: Self-pay | Admitting: Nurse Practitioner

## 2023-07-27 DIAGNOSIS — F331 Major depressive disorder, recurrent, moderate: Secondary | ICD-10-CM

## 2023-07-27 DIAGNOSIS — F909 Attention-deficit hyperactivity disorder, unspecified type: Secondary | ICD-10-CM

## 2023-07-27 DIAGNOSIS — F419 Anxiety disorder, unspecified: Secondary | ICD-10-CM

## 2023-07-27 DIAGNOSIS — F902 Attention-deficit hyperactivity disorder, combined type: Secondary | ICD-10-CM

## 2023-07-27 DIAGNOSIS — F329 Major depressive disorder, single episode, unspecified: Secondary | ICD-10-CM | POA: Diagnosis not present

## 2023-07-27 MED ORDER — AMPHETAMINE-DEXTROAMPHET ER 25 MG PO CP24: 25.0000 mg | ORAL_CAPSULE | ORAL | 0 refills | Status: DC

## 2023-07-27 MED ORDER — AMPHETAMINE-DEXTROAMPHET ER 25 MG PO CP24
25.0000 mg | ORAL_CAPSULE | ORAL | 0 refills | Status: DC
  Filled 2023-07-29: qty 30, 30d supply, fill #0

## 2023-07-27 NOTE — Assessment & Plan Note (Signed)
 Refer to depression plan of care.

## 2023-07-27 NOTE — Progress Notes (Signed)
LMP 04/27/2020    Subjective:    Patient ID: Jessica Cross, female    DOB: 05-11-1991, 33 y.o.   MRN: [id number]  HPI: Jessica Cross is a 33 y.o. female  Chief Complaint  Patient presents with   ADHD   Anxiety   Virtual Visit via Video Note  I connected with Jessica Cross on 07/27/23 at  3:20 PM EST by a video enabled telemedicine application and verified that I am speaking with the correct person using two identifiers.  Location: Patient: home Provider: work   I discussed the limitations of evaluation and management by telemedicine and the availability of in person appointments. The patient expressed understanding and agreed to proceed.  I discussed the assessment and treatment plan with the patient. The patient was provided an opportunity to ask questions and all were answered. The patient agreed with the plan and demonstrated an understanding of the instructions.   The patient was advised to call back or seek an in-person evaluation if the symptoms worsen or if the condition fails to improve as anticipated.  I provided 25 minutes of non-face-to-face time during this encounter.   Marjie Skiff, NP   ADHD FOLLOW UP Diagnosed by [facility] in November 2022 and taking Adderall XR 15 MG, in past 30 MG was too much she felt, but the 15 MG is too little and she has returned to difficulty with focusing. Continues Wellbutrin + Buspar as well for mood, which offers benefit, sleeping well with Buspar on board.  PDMP, last fill 06/30/23. ADHD status: ongoing Satisfied with current therapy: yes Medication compliance:  good compliance Controlled substance contract: yes Previous psychiatry evaluation: yes Previous medications: Vyvanse and Adderall Taking meds on weekends/vacations:  sometimes Work/school performance:  good Difficulty sustaining attention/completing tasks: yes Distracted by extraneous stimuli: yes Does not listen when spoken to:  sometimes Fidgets with hands or feet: yes Unable to stay in seat: yes Blurts out/interrupts others: no ADHD Medication Side Effects: yes    Decreased appetite: no     Headache: no    Sleeping disturbance pattern: no    Irritability: no    Rebound effects (worse than baseline) off medication: no    Anxiousness: no    Dizziness: no    Tics: no     07/27/2023    3:18 PM 04/24/2023    3:33 PM 01/09/2023    2:13 PM 10/10/2022    2:13 PM 07/07/2022    3:22 PM  Depression screen PHQ 2/9  Decreased Interest 1 1 2 1  0  Down, Depressed, Hopeless 1 1 1 1  0  PHQ - 2 Score 2 2 3 2  0  Altered sleeping 1 1 3 1  0  Tired, decreased energy 2 2 2 2  0  Change in appetite 0 0 0 0 0  Feeling bad or failure about yourself  1 1 0 1 0  Trouble concentrating 2 2 2 2  0  Moving slowly or fidgety/restless 0 0 0 0 0  Suicidal thoughts 0 0 0 0 0  PHQ-9 Score 8 8 10 8  0  Difficult doing work/chores Somewhat difficult  Not difficult at all Somewhat difficult Not difficult at all       07/27/2023    3:19 PM 04/24/2023    3:33 PM 01/09/2023    2:15 PM 10/10/2022    2:13 PM  GAD 7 : Generalized Anxiety Score  Nervous, Anxious, on Edge 2 2 2 2   Control/stop worrying 2  2 2 1   Worry too much - different things 3 2 1 2   Trouble relaxing 2 1 1 1   Restless 2 1 1 1   Easily annoyed or irritable 3 2 2 2   Afraid - awful might happen 2 2 2 2   Total GAD 7 Score 16 12 11 11   Anxiety Difficulty Somewhat difficult  Somewhat difficult Somewhat difficult   Relevant past medical, surgical, family and social history reviewed and updated as indicated. Interim medical history since our last visit reviewed. Allergies and medications reviewed and updated.  Review of Systems  Constitutional:  Negative for activity change, appetite change, chills, fatigue and fever.  Respiratory:  Negative for cough, chest tightness and wheezing.   Gastrointestinal: Negative.   Neurological: Negative.   Psychiatric/Behavioral:  Positive for  decreased concentration. Negative for self-injury, sleep disturbance and suicidal ideas. The patient is nervous/anxious.     Per HPI unless specifically indicated above     Objective:    LMP 04/27/2020   Wt Readings from Last 3 Encounters:  04/24/23 151 lb 9.6 oz (68.8 kg)  10/10/22 147 lb 12.8 oz (67 kg)  09/30/22 146 lb 11.2 oz (66.5 kg)    Physical Exam Vitals and nursing note reviewed.  Constitutional:      General: She is awake. She is not in acute distress.    Appearance: She is well-developed. She is not ill-appearing.  HENT:     Head: Normocephalic.     Right Ear: Hearing normal.     Left Ear: Hearing normal.  Eyes:     General: Lids are normal.        Right eye: No discharge.        Left eye: No discharge.     Conjunctiva/sclera: Conjunctivae normal.  Pulmonary:     Effort: Pulmonary effort is normal. No accessory muscle usage or respiratory distress.  Musculoskeletal:     Cervical back: Normal range of motion.  Neurological:     Mental Status: She is alert and oriented to person, place, and time.  Psychiatric:        Attention and Perception: Attention normal.        Mood and Affect: Mood normal.        Behavior: Behavior normal. Behavior is cooperative.        Thought Content: Thought content normal.        Judgment: Judgment normal.    Results for orders placed or performed in visit on 10/10/22  [id number] 11+Oxyco+Alc+Crt-Bund   Collection Time: 10/10/22  2:43 PM  Result Value Ref Range   Ethanol Negative Cutoff=0.020 %   Amphetamines, Urine See Final Results Cutoff=1000 ng/mL   Barbiturate Negative Cutoff=200 ng/mL   BENZODIAZ UR QL Negative Cutoff=200 ng/mL   Cannabinoid Quant, Ur See Final Results Cutoff=50 ng/mL   Cocaine (Metabolite) Negative Cutoff=300 ng/mL   OPIATE SCREEN URINE Negative Cutoff=300 ng/mL   Oxycodone/Oxymorphone, Urine Negative Cutoff=300 ng/mL   Phencyclidine Negative Cutoff=25 ng/mL   Methadone Screen, Urine Negative Cutoff=300  ng/mL   Propoxyphene Negative Cutoff=300 ng/mL   Meperidine Negative Cutoff=200 ng/mL   Tramadol Negative Cutoff=200 ng/mL   Creatinine 43.2 20.0 - 300.0 mg/dL   pH, Urine 5.3 4.5 - 8.9  Drug Profile [id number]   Collection Time: 10/10/22  2:43 PM  Result Value Ref Range   Amphetamines Positive (A) Cutoff=1000   Amphetamine Positive (A)    Amphetamine GC/MS Conf >3000 Cutoff=500 ng/mL   Methamphetamine Negative Cutoff=500  Cannabinoid Conf, Ur   Collection  Time: 10/10/22  2:43 PM  Result Value Ref Range   CANNABINOIDS Negative Cutoff=50      Assessment & Plan:   Problem List Items Addressed This Visit       Other   ADHD (attention deficit hyperactivity disorder)   Chronic, ongoing.  Diagnosed at [facility] in November 2022. At this time continue Wellbutrin 150 MG XL daily as is offering benefit and she is fearful of stopping.  Had to stop Vyvanse in past due to cost.  Adderall XR 30 was too much and XR 15 MG is too little.  We will change to Adderall XR 25 MG daily which may offer benefit without being too much in either direction. Discussed with patient. She is aware of office rules on this and agreeable to yearly UDS and controlled subs agreement - up to date.  Return in 3 months.  UDS due next due 10/10/23.        Anxiety   Refer to depression plan of care.      Depression - Primary   Chronic, ongoing. Continue Wellbutrin 150 MG XL daily as is offering benefit, however made her aware of possibility for interactions with her Metoprolol -- if these present I recommend stopping Wellbutrin and switching to an alternative such as SSRI or SNRI.  Continue Buspar 10 MG at night, this offers benefit to sleep.  She denies SI/HI.  May also benefit from therapy on regular basis.         Follow up plan: Return in about 3 months (around 10/24/2023) for ADD with UDS due.

## 2023-07-27 NOTE — Assessment & Plan Note (Signed)
Chronic, ongoing.  Diagnosed at [facility] in November 2022. At this time continue Wellbutrin 150 MG XL daily as is offering benefit and she is fearful of stopping.  Had to stop Vyvanse in past due to cost.  Adderall XR 30 was too much and XR 15 MG is too little.  We will change to Adderall XR 25 MG daily which may offer benefit without being too much in either direction. Discussed with patient. She is aware of office rules on this and agreeable to yearly UDS and controlled subs agreement - up to date.  Return in 3 months.  UDS due next due 10/10/23.

## 2023-07-27 NOTE — Assessment & Plan Note (Signed)
Chronic, ongoing. Continue Wellbutrin 150 MG XL daily as is offering benefit, however made her aware of possibility for interactions with her Metoprolol -- if these present I recommend stopping Wellbutrin and switching to an alternative such as SSRI or SNRI.  Continue Buspar 10 MG at night, this offers benefit to sleep.  She denies SI/HI.  May also benefit from therapy on regular basis.

## 2023-07-29 ENCOUNTER — Other Ambulatory Visit: Payer: Self-pay

## 2023-08-20 ENCOUNTER — Other Ambulatory Visit: Payer: Self-pay

## 2023-08-20 ENCOUNTER — Other Ambulatory Visit: Payer: Self-pay | Admitting: Nurse Practitioner

## 2023-08-21 ENCOUNTER — Other Ambulatory Visit: Payer: Self-pay

## 2023-08-21 MED FILL — Metoprolol Succinate Tab ER 24HR 25 MG (Tartrate Equiv): ORAL | 90 days supply | Qty: 45 | Fill #0 | Status: AC

## 2023-08-21 NOTE — Telephone Encounter (Signed)
 Rx was not sent.   I sent it in.  Requested Prescriptions  Pending Prescriptions Disp Refills   metoprolol succinate (TOPROL-XL) 25 MG 24 hr tablet 45 tablet 4    Sig: Take 0.5 tablets (12.5 mg total) by mouth daily.     Cardiovascular:  Beta Blockers Passed - 08/21/2023  3:44 PM      Passed - Last BP in normal range    BP Readings from Last 1 Encounters:  04/24/23 121/77         Passed - Last Heart Rate in normal range    Pulse Readings from Last 1 Encounters:  04/24/23 94         Passed - Valid encounter within last 6 months    Recent Outpatient Visits           3 months ago Moderate episode of recurrent major depressive disorder (HCC)   [place] [facility] [place], Jolene T, NP   7 months ago Moderate episode of recurrent major depressive disorder (HCC)   [place] [facility] [place], Jolene T, NP   10 months ago Moderate episode of recurrent major depressive disorder (HCC)   [place] [facility] [place], [place] T, NP   1 year ago Moderate episode of recurrent major depressive disorder (HCC)   [place] [facility] [place], Corrie Dandy T, NP   1 year ago Moderate episode of recurrent major depressive disorder (HCC)   [place] [facility] [place], Dorie Rank, NP       Future Appointments             In 4 days Elie Goody, MD [facility] [place] [facility]   In 1 week [place], Dorie Rank, NP [place] [facility], [facility]   In 2 months [place], Dorie Rank, NP [place] [facility], [facility]

## 2023-08-23 NOTE — Patient Instructions (Signed)
 Be Involved in Caring For Your Health:  Taking Medications When medications are taken as directed, they can greatly improve your health. But if they are not taken as prescribed, they may not work. In some cases, not taking them correctly can be harmful. To help ensure your treatment remains effective and safe, understand your medications and how to take them. Bring your medications to each visit for review by your provider.  Your lab results, notes, and after visit summary will be available on My Chart. We strongly encourage you to use this feature. If lab results are abnormal the clinic will contact you with the appropriate steps. If the clinic does not contact you assume the results are satisfactory. You can always view your results on My Chart. If you have questions regarding your health or results, please contact the clinic during office hours. You can also ask questions on My Chart.  We at [facility] are grateful that you chose [place] to provide your care. We strive to provide evidence-based and compassionate care and are always looking for feedback. If you get a survey from the clinic please complete this so we can hear your opinions.  Managing Anxiety, Adult After being diagnosed with anxiety, you may be relieved to know why you have felt or behaved a certain way. You may also feel overwhelmed about the treatment ahead and what it will mean for your life. With care and support, you can manage your anxiety. How to manage lifestyle changes Understanding the difference between stress and anxiety Although stress can play a role in anxiety, it is not the same as anxiety. Stress is your body's reaction to life changes and events, both good and bad. Stress is often caused by something external, such as a deadline, test, or competition. It normally goes away after the event has ended and will last just a few hours. But, stress can be ongoing and can lead to more than just stress. Anxiety is  caused by something internal, such as imagining a terrible outcome or worrying that something will go wrong that will greatly upset you. Anxiety often does not go away even after the event is over, and it can become a long-term (chronic) worry. Lowering stress and anxiety Talk with your health care provider or a counselor to learn more about lowering anxiety and stress. They may suggest tension-reduction techniques, such as: Music. Spend time creating or listening to music that you enjoy and that inspires you. Mindfulness-based meditation. Practice being aware of your normal breaths while not trying to control your breathing. It can be done while sitting or walking. Centering prayer. Focus on a word, phrase, or sacred image that means something to you and brings you peace. Deep breathing. Expand your stomach and inhale slowly through your nose. Hold your breath for 3-5 seconds. Then breathe out slowly, letting your stomach muscles relax. Self-talk. Learn to notice and spot thought patterns that lead to anxiety reactions. Change those patterns to thoughts that feel peaceful. Muscle relaxation. Take time to tense muscles and then relax them. Choose a tension-reduction technique that fits your lifestyle and personality. These techniques take time and practice. Set aside 5-15 minutes a day to do them. Specialized therapists can offer counseling and training in these techniques. The training to help with anxiety may be covered by some insurance plans. Other things you can do to manage stress and anxiety include: Keeping a stress diary. This can help you learn what triggers your reaction and then learn ways  to manage your response. Thinking about how you react to certain situations. You may not be able to control everything, but you can control your response. Making time for activities that help you relax and not feeling guilty about spending your time in this way. Doing visual imagery. This involves  imagining or creating mental pictures to help you relax. Practicing yoga. Through yoga poses, you can lower tension and relax.  Medicines Medicines for anxiety include: Antidepressant medicines. These are usually prescribed for long-term daily control. Anti-anxiety medicines. These may be added in severe cases, especially when panic attacks occur. When used together, medicines, psychotherapy, and tension-reduction techniques may be the most effective treatment. Relationships Relationships can play a big part in helping you recover. Spend more time connecting with trusted friends and family members. Think about going to couples counseling if you have a partner, taking family education classes, or going to family therapy. Therapy can help you and others better understand your anxiety. How to recognize changes in your anxiety Everyone responds differently to treatment for anxiety. Recovery from anxiety happens when symptoms lessen and stop interfering with your daily life at home or work. This may mean that you will start to: Have better concentration and focus. Worry will interfere less in your daily thinking. Sleep better. Be less irritable. Have more energy. Have improved memory. Try to recognize when your condition is getting worse. Contact your provider if your symptoms interfere with home or work and you feel like your condition is not improving. Follow these instructions at home: Activity Exercise. Adults should: Exercise for at least 150 minutes each week. The exercise should increase your heart rate and make you sweat (moderate-intensity exercise). Do strengthening exercises at least twice a week. Get the right amount and quality of sleep. Most adults need 7-9 hours of sleep each night. Lifestyle  Eat a healthy diet that includes plenty of vegetables, fruits, whole grains, low-fat dairy products, and lean protein. Do not eat a lot of foods that are high in fats, added sugars, or salt  (sodium). Make choices that simplify your life. Do not use any products that contain nicotine or tobacco. These products include cigarettes, chewing tobacco, and vaping devices, such as e-cigarettes. If you need help quitting, ask your provider. Avoid caffeine, alcohol, and certain over-the-counter cold medicines. These may make you feel worse. Ask your pharmacist which medicines to avoid. General instructions Take over-the-counter and prescription medicines only as told by your provider. Keep all follow-up visits. This is to make sure you are managing your anxiety well or if you need more support. Where to find support You can get help and support from: Self-help groups. Online and [employer]. A trusted spiritual leader. Couples counseling. Family education classes. Family therapy. Where to find more information You may find that joining a support group helps you deal with your anxiety. The following sources can help you find counselors or support groups near you: Mental Health [place]: mentalhealthamerica.net Anxiety and [employer] of [place] (ADAA): adaa.org [employer] Mental [employer]ness (NAMI): nami.org Contact a health care provider if: You have a hard time staying focused or finishing tasks. You spend many hours a day feeling worried about everyday life. You are very tired because you cannot stop worrying. You start to have headaches or often feel tense. You have chronic nausea or diarrhea. Get help right away if: Your heart feels like it is racing. You have shortness of breath. You have thoughts of hurting yourself or others. Get help  right away if you feel like you may hurt yourself or others, or have thoughts about taking your own life. Go to your nearest emergency room or: Call 911. Call the National Suicide Prevention Lifeline at 765-482-1593 or 988. This is open 24 hours a day. Text the Crisis Text Line at 504 124 9896. This information is not  intended to replace advice given to you by your health care provider. Make sure you discuss any questions you have with your health care provider. Document Revised: 03/18/2022 Document Reviewed: 09/30/2020 Elsevier Patient Education  2024 [employer].

## 2023-08-24 ENCOUNTER — Other Ambulatory Visit: Payer: Self-pay

## 2023-08-24 ENCOUNTER — Other Ambulatory Visit: Payer: Self-pay | Admitting: Dermatology

## 2023-08-25 ENCOUNTER — Ambulatory Visit: Payer: 59 | Admitting: Dermatology

## 2023-08-28 ENCOUNTER — Other Ambulatory Visit: Payer: Self-pay

## 2023-08-28 ENCOUNTER — Ambulatory Visit (INDEPENDENT_AMBULATORY_CARE_PROVIDER_SITE_OTHER): Payer: 59 | Admitting: Nurse Practitioner

## 2023-08-28 VITALS — BP 104/66 | HR 83 | Temp 98.4°F | Ht 60.75 in | Wt 147.8 lb

## 2023-08-28 DIAGNOSIS — I491 Atrial premature depolarization: Secondary | ICD-10-CM

## 2023-08-28 DIAGNOSIS — Z79899 Other long term (current) drug therapy: Secondary | ICD-10-CM | POA: Diagnosis not present

## 2023-08-28 DIAGNOSIS — Z0000 Encounter for general adult medical examination without abnormal findings: Secondary | ICD-10-CM

## 2023-08-28 DIAGNOSIS — F331 Major depressive disorder, recurrent, moderate: Secondary | ICD-10-CM

## 2023-08-28 DIAGNOSIS — F419 Anxiety disorder, unspecified: Secondary | ICD-10-CM | POA: Diagnosis not present

## 2023-08-28 DIAGNOSIS — F902 Attention-deficit hyperactivity disorder, combined type: Secondary | ICD-10-CM

## 2023-08-28 MED ORDER — BUPROPION HCL ER (XL) 150 MG PO TB24
150.0000 mg | ORAL_TABLET | Freq: Every day | ORAL | 5 refills | Status: AC
  Filled 2023-08-28 – 2023-09-11 (×2): qty 90, 90d supply, fill #0
  Filled 2023-11-27 (×2): qty 90, 90d supply, fill #1
  Filled 2024-02-02 – 2024-03-01 (×2): qty 90, 90d supply, fill #2
  Filled 2024-07-04: qty 90, 90d supply, fill #3

## 2023-08-28 MED ORDER — BUSPIRONE HCL 10 MG PO TABS
10.0000 mg | ORAL_TABLET | Freq: Every day | ORAL | 4 refills | Status: DC
  Filled 2023-08-28 – 2023-11-27 (×7): qty 90, 90d supply, fill #0

## 2023-08-28 MED ORDER — AMPHETAMINE-DEXTROAMPHET ER 25 MG PO CP24
25.0000 mg | ORAL_CAPSULE | ORAL | 0 refills | Status: DC
  Filled 2023-08-28 – 2023-09-11 (×2): qty 30, 30d supply, fill #0

## 2023-08-28 MED ORDER — AMPHETAMINE-DEXTROAMPHET ER 25 MG PO CP24
25.0000 mg | ORAL_CAPSULE | ORAL | 0 refills | Status: DC
  Filled 2023-10-29: qty 30, 30d supply, fill #0

## 2023-08-28 MED ORDER — AMPHETAMINE-DEXTROAMPHET ER 25 MG PO CP24: 25.0000 mg | ORAL_CAPSULE | ORAL | 0 refills | Status: DC

## 2023-08-28 MED ORDER — METOPROLOL SUCCINATE ER 25 MG PO TB24
12.5000 mg | ORAL_TABLET | Freq: Every day | ORAL | 4 refills | Status: AC
  Filled 2023-08-28 – 2023-11-27 (×5): qty 45, 90d supply, fill #0
  Filled 2024-02-02 – 2024-03-01 (×2): qty 45, 90d supply, fill #1
  Filled 2024-05-23: qty 45, 90d supply, fill #2
  Filled 2024-07-15: qty 45, 90d supply, fill #3

## 2023-08-28 NOTE — Assessment & Plan Note (Signed)
 Chronic, ongoing. Continue Wellbutrin 150 MG XL daily as is offering benefit, however made her aware of possibility for interactions with her Metoprolol -- if these present I recommend stopping Wellbutrin and switching to an alternative such as SSRI or SNRI.  Continue Buspar 10 MG at night, this offers benefit to sleep.  She denies SI/HI.  May also benefit from therapy on regular basis.

## 2023-08-28 NOTE — Assessment & Plan Note (Signed)
Currently taking Metoprolol XL 12.5 MG, may need to discontinue this in future if BP too low or increased dizziness, especially while on Wellbutrin, which made her aware of.  Suspect in future may not need Metoprolol XL if anxiety improved.  Monitor closely and further reduce as needed.

## 2023-08-28 NOTE — Addendum Note (Signed)
 Addended by: Aura Dials T on: 08/28/2023 05:12 PM   Modules accepted: Orders

## 2023-08-28 NOTE — Progress Notes (Signed)
 BP 104/66   Pulse 83   Temp 98.4 F (36.9 C) (Oral)   Ht 5' 0.75" (1.543 m)   Wt 147 lb 12.8 oz (67 kg)   LMP 04/27/2020   SpO2 98%   BMI 28.16 kg/m    Subjective:    Patient ID: Jessica Cross, female    DOB: 11-24-1990, 33 y.o.   MRN: [id number]  HPI: Jessica Cross is a 33 y.o. female presenting on 08/28/2023 for comprehensive medical examination. Current medical complaints include:none  She currently lives with: husband and kids Menopausal Symptoms: no  Continues on Metoprolol for history of PACs, this has continued to offer her benefit.  ADHD FOLLOW UP Diagnosed by [facility] in November 2022. Taking Adderall XR 25 MG which is "perfect dose". In past 30 MG was too much she felt, but the 15 MG was too little. Continues Wellbutrin + Buspar for mood, which offers benefit.  PDMP, last fill 07/29/23. ADHD status: ongoing Satisfied with current therapy: yes Medication compliance:  good compliance Controlled substance contract: yes Previous psychiatry evaluation: yes Previous medications: Vyvanse and Adderall Taking meds on weekends/vacations: depends on what she is doing Work/school performance:  good Difficulty sustaining attention/completing tasks: no Distracted by extraneous stimuli: towards end of day, but overall good Does not listen when spoken to: sometimes Fidgets with hands or feet: yes Unable to stay in seat: occasional Blurts out/interrupts others: no ADHD Medication Side Effects: yes    Decreased appetite: no     Headache: no    Sleeping disturbance pattern: no    Irritability: no    Rebound effects (worse than baseline) off medication: no    Anxiousness: no    Dizziness: no    Tics: no   Depression Screen done today and results listed below:     08/28/2023    3:25 PM 07/27/2023    3:18 PM 04/24/2023    3:33 PM 01/09/2023    2:13 PM 10/10/2022    2:13 PM  Depression screen PHQ 2/9  Decreased Interest 1 1 1 2 1   Down, Depressed, Hopeless  1 1 1 1 1   PHQ - 2 Score 2 2 2 3 2   Altered sleeping 1 1 1 3 1   Tired, decreased energy 2 2 2 2 2   Change in appetite 0 0 0 0 0  Feeling bad or failure about yourself  0 1 1 0 1  Trouble concentrating 2 2 2 2 2   Moving slowly or fidgety/restless 0 0 0 0 0  Suicidal thoughts 0 0 0 0 0  PHQ-9 Score 7 8 8 10 8   Difficult doing work/chores Somewhat difficult Somewhat difficult  Not difficult at all Somewhat difficult      08/28/2023    3:25 PM 07/27/2023    3:19 PM 04/24/2023    3:33 PM 01/09/2023    2:15 PM  GAD 7 : Generalized Anxiety Score  Nervous, Anxious, on Edge 2 2 2 2   Control/stop worrying 2 2 2 2   Worry too much - different things 1 3 2 1   Trouble relaxing 1 2 1 1   Restless 1 2 1 1   Easily annoyed or irritable 1 3 2 2   Afraid - awful might happen 1 2 2 2   Total GAD 7 Score 9 16 12 11   Anxiety Difficulty Somewhat difficult Somewhat difficult  Somewhat difficult      09/30/2022    1:09 PM 10/10/2022    2:13 PM 01/09/2023  2:13 PM 07/27/2023    3:18 PM 08/28/2023    3:25 PM  Fall Risk  Falls in the past year? 0 0 0 0 0  Was there an injury with Fall? 0 0 0 0 0  Fall Risk Category Calculator 0 0 0 0 0  Patient at Risk for Falls Due to No Fall Risks No Fall Risks No Fall Risks No Fall Risks No Fall Risks  Fall risk Follow up Falls evaluation completed Falls evaluation completed Falls evaluation completed Falls evaluation completed Falls evaluation completed    Past Medical History:  Past Medical History:  Diagnosis Date   Allergy    Anemia    H/O    Anxiety    Asthma    as a child    Depression    Migraine    Tobacco use    Surgical History:  Past Surgical History:  Procedure Laterality Date   ABDOMINAL HYSTERECTOMY  04/30/20   COSMETIC SURGERY     CYSTOSCOPY  05/14/2020   Procedure: CYSTOSCOPY;  Surgeon: Ward, Elenora Fender, MD;  Location: [facility];  Service: Gynecology;;   NOSE SURGERY     Due to broken nose as a child.   TOTAL LAPAROSCOPIC HYSTERECTOMY WITH  SALPINGECTOMY Bilateral 05/14/2020   Procedure: TOTAL LAPAROSCOPIC HYSTERECTOMY WITH BILATERAL SALPINGECTOMY;  Surgeon: Ward, Elenora Fender, MD;  Location: [facility];  Service: Gynecology;  Laterality: Bilateral;    Medications:  Current Outpatient Medications on File Prior to Visit  Medication Sig   tretinoin (RETIN-A) 0.05 % cream Apply 1 pea-sized application topically to affected areas of skin at bedtime.   No current facility-administered medications on file prior to visit.    Allergies:  Allergies  Allergen Reactions   Bactrim [Sulfamethoxazole-Trimethoprim] Hives    Social History:  Social History   Socioeconomic History   Marital status: Married    Spouse name: Not on file   Number of children: Not on file   Years of education: Not on file   Highest education level: Some college, no degree  Occupational History   Not on file  Tobacco Use   Smoking status: Every Day    Types: E-cigarettes   Smokeless tobacco: Never  Vaping Use   Vaping status: Some Days   Substances: Nicotine  Substance and Sexual Activity   Alcohol use: Yes    Alcohol/week: 5.0 - 10.0 standard drinks of alcohol    Types: 5 - 10 Standard drinks or equivalent per week    Comment: occasionally   Drug use: No   Sexual activity: Yes    Partners: Male    Birth control/protection: Surgical  Other Topics Concern   Not on file  Social History Narrative   Not on file   Social Drivers of Health   Financial Resource Strain: Low Risk  (08/28/2023)   Overall Financial Resource Strain (CARDIA)    Difficulty of Paying Living Expenses: Not hard at all  Food Insecurity: No Food Insecurity (08/28/2023)   Hunger Vital Sign    Worried About Running Out of Food in the Last Year: Never true    Ran Out of Food in the Last Year: Never true  Transportation Needs: No Transportation Needs (08/28/2023)   PRAPARE - Administrator, Civil Service (Medical): No    Lack of Transportation (Non-Medical): No   Physical Activity: Insufficiently Active (08/28/2023)   Exercise Vital Sign    Days of Exercise per Week: 2 days    Minutes of Exercise per  Session: 20 min  Stress: Stress Concern Present (08/28/2023)   [name] of Occupational Health - Occupational Stress Questionnaire    Feeling of Stress : Rather much  Social Connections: Moderately Isolated (08/28/2023)   Social Connection and Isolation Panel [NHANES]    Frequency of Communication with Friends and Family: More than three times a week    Frequency of Social Gatherings with Friends and Family: Once a week    Attends Religious Services: Never    Database administrator or Organizations: No    Attends Engineer, structural: Not on file    Marital Status: Married  Catering manager Violence: Not At Risk (08/28/2023)   Humiliation, Afraid, Rape, and Kick questionnaire    Fear of Current or Ex-Partner: No    Emotionally Abused: No    Physically Abused: No    Sexually Abused: No   Social History   Tobacco Use  Smoking Status Every Day   Types: E-cigarettes  Smokeless Tobacco Never   Social History   Substance and Sexual Activity  Alcohol Use Yes   Alcohol/week: 5.0 - 10.0 standard drinks of alcohol   Types: 5 - 10 Standard drinks or equivalent per week   Comment: occasionally    Family History:  Family History  Problem Relation Age of Onset   Healthy Mother    Healthy Father    Kidney cancer Maternal Aunt    Breast cancer Maternal Grandmother    Cancer Maternal Grandmother    Cancer Paternal Grandfather    Cancer Maternal Aunt    Past medical history, surgical history, medications, allergies, family history and social history reviewed with patient today and changes made to appropriate areas of the chart.   ROS All other ROS negative except what is listed above and in the HPI.      Objective:    BP 104/66   Pulse 83   Temp 98.4 F (36.9 C) (Oral)   Ht 5' 0.75" (1.543 m)   Wt 147 lb 12.8 oz (67 kg)    LMP 04/27/2020   SpO2 98%   BMI 28.16 kg/m   Wt Readings from Last 3 Encounters:  08/28/23 147 lb 12.8 oz (67 kg)  04/24/23 151 lb 9.6 oz (68.8 kg)  10/10/22 147 lb 12.8 oz (67 kg)    Physical Exam Vitals and nursing note reviewed. Exam conducted with a chaperone present.  Constitutional:      General: She is awake. She is not in acute distress.    Appearance: She is well-developed and well-groomed. She is not ill-appearing or toxic-appearing.  HENT:     Head: Normocephalic and atraumatic.     Right Ear: Hearing, tympanic membrane, ear canal and external ear normal. No drainage.     Left Ear: Hearing, tympanic membrane, ear canal and external ear normal. No drainage.     Nose: Nose normal.     Right Sinus: No maxillary sinus tenderness or frontal sinus tenderness.     Left Sinus: No maxillary sinus tenderness or frontal sinus tenderness.     Mouth/Throat:     Mouth: Mucous membranes are moist.     Pharynx: Oropharynx is clear. Uvula midline. No pharyngeal swelling, oropharyngeal exudate or posterior oropharyngeal erythema.  Eyes:     General: Lids are normal.        Right eye: No discharge.        Left eye: No discharge.     Extraocular Movements: Extraocular movements intact.  Conjunctiva/sclera: Conjunctivae normal.     Pupils: Pupils are equal, round, and reactive to light.     Visual Fields: Right eye visual fields normal and left eye visual fields normal.  Neck:     Thyroid: No thyromegaly.     Vascular: No carotid bruit.     Trachea: Trachea normal.  Cardiovascular:     Rate and Rhythm: Normal rate and regular rhythm.     Heart sounds: Normal heart sounds. No murmur heard.    No gallop.  Pulmonary:     Effort: Pulmonary effort is normal. No accessory muscle usage or respiratory distress.     Breath sounds: Normal breath sounds.  Chest:  Breasts:    Right: Normal.     Left: Normal.  Abdominal:     General: Bowel sounds are normal.     Palpations: Abdomen is  soft. There is no hepatomegaly or splenomegaly.     Tenderness: There is no abdominal tenderness.  Musculoskeletal:        General: Normal range of motion.     Cervical back: Normal range of motion and neck supple.     Right lower leg: No edema.     Left lower leg: No edema.  Lymphadenopathy:     Head:     Right side of head: No submental, submandibular, tonsillar, preauricular or posterior auricular adenopathy.     Left side of head: No submental, submandibular, tonsillar, preauricular or posterior auricular adenopathy.     Cervical: No cervical adenopathy.     Upper Body:     Right upper body: No supraclavicular, axillary or pectoral adenopathy.     Left upper body: No supraclavicular, axillary or pectoral adenopathy.  Skin:    General: Skin is warm and dry.     Capillary Refill: Capillary refill takes less than 2 seconds.     Findings: No rash.  Neurological:     Mental Status: She is alert and oriented to person, place, and time.     Gait: Gait is intact.     Deep Tendon Reflexes: Reflexes are normal and symmetric.     Reflex Scores:      Brachioradialis reflexes are 2+ on the right side and 2+ on the left side.      Patellar reflexes are 2+ on the right side and 2+ on the left side. Psychiatric:        Attention and Perception: Attention normal.        Mood and Affect: Mood normal.        Speech: Speech normal.        Behavior: Behavior normal. Behavior is cooperative.        Thought Content: Thought content normal.        Judgment: Judgment normal.    Results for orders placed or performed in visit on 10/10/22  [id number] 11+Oxyco+Alc+Crt-Bund   Collection Time: 10/10/22  2:43 PM  Result Value Ref Range   Ethanol Negative Cutoff=0.020 %   Amphetamines, Urine See Final Results Cutoff=1000 ng/mL   Barbiturate Negative Cutoff=200 ng/mL   BENZODIAZ UR QL Negative Cutoff=200 ng/mL   Cannabinoid Quant, Ur See Final Results Cutoff=50 ng/mL   Cocaine (Metabolite) Negative  Cutoff=300 ng/mL   OPIATE SCREEN URINE Negative Cutoff=300 ng/mL   Oxycodone/Oxymorphone, Urine Negative Cutoff=300 ng/mL   Phencyclidine Negative Cutoff=25 ng/mL   Methadone Screen, Urine Negative Cutoff=300 ng/mL   Propoxyphene Negative Cutoff=300 ng/mL   Meperidine Negative Cutoff=200 ng/mL   Tramadol Negative Cutoff=200 ng/mL  Creatinine 43.2 20.0 - 300.0 mg/dL   pH, Urine 5.3 4.5 - 8.9  Drug Profile [id number]   Collection Time: 10/10/22  2:43 PM  Result Value Ref Range   Amphetamines Positive (A) Cutoff=1000   Amphetamine Positive (A)    Amphetamine GC/MS Conf >3000 Cutoff=500 ng/mL   Methamphetamine Negative Cutoff=500  Cannabinoid Conf, Ur   Collection Time: 10/10/22  2:43 PM  Result Value Ref Range   CANNABINOIDS Negative Cutoff=50      Assessment & Plan:   Problem List Items Addressed This Visit       Cardiovascular and Mediastinum   PAC (premature atrial contraction)   Currently taking Metoprolol XL 12.5 MG, may need to discontinue this in future if BP too low or increased dizziness, especially while on Wellbutrin, which made her aware of.  Suspect in future may not need Metoprolol XL if anxiety improved.  Monitor closely and further reduce as needed.      Relevant Medications   metoprolol succinate (TOPROL-XL) 25 MG 24 hr tablet   Other Relevant Orders   CBC with Differential/Platelet   Comprehensive metabolic panel   TSH   Lipid Panel w/o Chol/HDL Ratio     Other   ADHD (attention deficit hyperactivity disorder)   Chronic, ongoing.  Diagnosed at [facility] in November 2022. At this time continue Wellbutrin 150 MG XL daily.  Had to stop Vyvanse in past due to cost.  Continue Adderall XR 25 MG daily which is offering her benefit - 15 was too low and 30 too high. Discussed with patient. She is aware of office rules on this and agreeable to yearly UDS and controlled subs agreement - up to date.  Return in 3 months.  UDS due next due 08/27/24.         Relevant Orders   [id number] 11+Oxyco+Alc+Crt-Bund   Anxiety   Refer to depression plan of care.      Relevant Medications   buPROPion (WELLBUTRIN XL) 150 MG 24 hr tablet   busPIRone (BUSPAR) 10 MG tablet   Depression - Primary   Chronic, ongoing. Continue Wellbutrin 150 MG XL daily as is offering benefit, however made her aware of possibility for interactions with her Metoprolol -- if these present I recommend stopping Wellbutrin and switching to an alternative such as SSRI or SNRI.  Continue Buspar 10 MG at night, this offers benefit to sleep.  She denies SI/HI.  May also benefit from therapy on regular basis.        Relevant Medications   buPROPion (WELLBUTRIN XL) 150 MG 24 hr tablet   busPIRone (BUSPAR) 10 MG tablet   Other Visit Diagnoses       High risk medication use       UDS today for annual check with Adderall.   Relevant Orders   [id number] 11+Oxyco+Alc+Crt-Bund     Encounter for annual physical exam       Annual physical today with labs and health maintenance reviewed, discussed with patient.        Follow up plan: Return in about 3 months (around 11/28/2023) for ADD -- virtual.   LABORATORY TESTING:  - Pap smear: not applicable  IMMUNIZATIONS:   - Tdap: Tetanus vaccination status reviewed: last tetanus booster within 10 years. - Influenza: Refused - Pneumovax: Not applicable - Prevnar: Refused - COVID: Up to date -- x 2 doses - HPV: Not applicable - Shingrix vaccine: Not applicable  SCREENING: -Mammogram: Not applicable  - Colonoscopy: Not applicable  -  Bone Density: Not applicable  -Hearing Test: Not applicable  -Spirometry: Not applicable   PATIENT COUNSELING:   Advised to take 1 mg of folate supplement per day if capable of pregnancy.   Sexuality: Discussed sexually transmitted diseases, partner selection, use of condoms, avoidance of unintended pregnancy  and contraceptive alternatives.   Advised to avoid cigarette smoking.  I discussed with the  patient that most people either abstain from alcohol or drink within safe limits (<=14/week and <=4 drinks/occasion for males, <=7/weeks and <= 3 drinks/occasion for females) and that the risk for alcohol disorders and other health effects rises proportionally with the number of drinks per week and how often a drinker exceeds daily limits.  Discussed cessation/primary prevention of drug use and availability of treatment for abuse.   Diet: Encouraged to adjust caloric intake to maintain  or achieve ideal body weight, to reduce intake of dietary saturated fat and total fat, to limit sodium intake by avoiding high sodium foods and not adding table salt, and to maintain adequate dietary potassium and calcium preferably from fresh fruits, vegetables, and low-fat dairy products.    Stressed the importance of regular exercise  Injury prevention: Discussed safety belts, safety helmets, smoke detector, smoking near bedding or upholstery.   Dental health: Discussed importance of regular tooth brushing, flossing, and dental visits.    NEXT PREVENTATIVE PHYSICAL DUE IN 1 YEAR. Return in about 3 months (around 11/28/2023) for ADD -- virtual.

## 2023-08-28 NOTE — Assessment & Plan Note (Signed)
 Chronic, ongoing.  Diagnosed at [facility] in November 2022. At this time continue Wellbutrin 150 MG XL daily.  Had to stop Vyvanse in past due to cost.  Continue Adderall XR 25 MG daily which is offering her benefit - 15 was too low and 30 too high. Discussed with patient. She is aware of office rules on this and agreeable to yearly UDS and controlled subs agreement - up to date.  Return in 3 months.  UDS due next due 08/27/24.

## 2023-08-28 NOTE — Assessment & Plan Note (Signed)
 Refer to depression plan of care.

## 2023-08-29 ENCOUNTER — Encounter: Payer: Self-pay | Admitting: Nurse Practitioner

## 2023-08-29 LAB — COMPREHENSIVE METABOLIC PANEL
ALT: 16 IU/L (ref 0–32)
AST: 18 IU/L (ref 0–40)
Albumin: 4.6 g/dL (ref 3.9–4.9)
Alkaline Phosphatase: 54 IU/L (ref 44–121)
BUN/Creatinine Ratio: 15 (ref 9–23)
BUN: 11 mg/dL (ref 6–20)
Bilirubin Total: 0.3 mg/dL (ref 0.0–1.2)
CO2: 22 mmol/L (ref 20–29)
Calcium: 9.9 mg/dL (ref 8.7–10.2)
Chloride: 101 mmol/L (ref 96–106)
Creatinine, Ser: 0.71 mg/dL (ref 0.57–1.00)
Globulin, Total: 2.3 g/dL (ref 1.5–4.5)
Glucose: 79 mg/dL (ref 70–99)
Potassium: 4.3 mmol/L (ref 3.5–5.2)
Sodium: 136 mmol/L (ref 134–144)
Total Protein: 6.9 g/dL (ref 6.0–8.5)
eGFR: 114 mL/min/{1.73_m2} (ref >59)

## 2023-08-29 LAB — CBC WITH DIFFERENTIAL/PLATELET
Basophils Absolute: 0 10*3/uL (ref 0.0–0.2)
Basos: 1 %
EOS (ABSOLUTE): 0.2 10*3/uL (ref 0.0–0.4)
Eos: 3 %
Hematocrit: 40 % (ref 34.0–46.6)
Hemoglobin: 13.4 g/dL (ref 11.1–15.9)
Immature Grans (Abs): 0 10*3/uL (ref 0.0–0.1)
Immature Granulocytes: 0 %
Lymphocytes Absolute: 1.9 10*3/uL (ref 0.7–3.1)
Lymphs: 33 %
MCH: 30.7 pg (ref 26.6–33.0)
MCHC: 33.5 g/dL (ref 31.5–35.7)
MCV: 92 fL (ref 79–97)
Monocytes Absolute: 0.4 10*3/uL (ref 0.1–0.9)
Monocytes: 7 %
Neutrophils Absolute: 3.3 10*3/uL (ref 1.4–7.0)
Neutrophils: 56 %
Platelets: 278 10*3/uL (ref 150–450)
RBC: 4.36 x10E6/uL (ref 3.77–5.28)
RDW: 11.8 % (ref 11.7–15.4)
WBC: 5.8 10*3/uL (ref 3.4–10.8)

## 2023-08-29 LAB — LIPID PANEL W/O CHOL/HDL RATIO
Cholesterol, Total: 195 mg/dL (ref 100–199)
HDL: 61 mg/dL (ref >39)
LDL Chol Calc (NIH): 114 mg/dL — ABNORMAL HIGH (ref 0–99)
Triglycerides: 113 mg/dL (ref 0–149)
VLDL Cholesterol Cal: 20 mg/dL (ref 5–40)

## 2023-08-29 LAB — TSH: TSH: 2.3 u[IU]/mL (ref 0.450–4.500)

## 2023-08-29 NOTE — Progress Notes (Signed)
 Contacted via MyChart   Good evening Jessica Cross, your labs have returned and overall are stable.  Lipid panel does show elevation in LDL, bad cholesterol, however this has trended down and total cholesterol is normal.  Continue diet focus and regular exercise.  Any questions? Keep being stellar!!  Thank you for allowing me to participate in your care.  I appreciate you. Kindest regards, Kendrix Orman

## 2023-09-09 ENCOUNTER — Other Ambulatory Visit: Payer: Self-pay

## 2023-09-11 ENCOUNTER — Other Ambulatory Visit: Payer: Self-pay

## 2023-10-26 ENCOUNTER — Ambulatory Visit: Payer: 59 | Admitting: Nurse Practitioner

## 2023-10-26 DIAGNOSIS — F419 Anxiety disorder, unspecified: Secondary | ICD-10-CM

## 2023-10-26 DIAGNOSIS — F902 Attention-deficit hyperactivity disorder, combined type: Secondary | ICD-10-CM

## 2023-10-26 DIAGNOSIS — F331 Major depressive disorder, recurrent, moderate: Secondary | ICD-10-CM

## 2023-10-29 ENCOUNTER — Other Ambulatory Visit: Payer: Self-pay

## 2023-11-13 ENCOUNTER — Other Ambulatory Visit: Payer: Self-pay

## 2023-11-24 ENCOUNTER — Other Ambulatory Visit: Payer: Self-pay

## 2023-11-27 ENCOUNTER — Other Ambulatory Visit: Payer: Self-pay

## 2023-11-27 ENCOUNTER — Other Ambulatory Visit (HOSPITAL_COMMUNITY): Payer: Self-pay

## 2023-11-29 NOTE — Patient Instructions (Signed)
 Be Involved in Caring For Your Health:  Taking Medications When medications are taken as directed, they can greatly improve your health. But if they are not taken as prescribed, they may not work. In some cases, not taking them correctly can be harmful. To help ensure your treatment remains effective and safe, understand your medications and how to take them. Bring your medications to each visit for review by your provider.  Your lab results, notes, and after visit summary will be available on My Chart. We strongly encourage you to use this feature. If lab results are abnormal the clinic will contact you with the appropriate steps. If the clinic does not contact you assume the results are satisfactory. You can always view your results on My Chart. If you have questions regarding your health or results, please contact the clinic during office hours. You can also ask questions on My Chart.  We at [facility] are grateful that you chose [place] to provide your care. We strive to provide evidence-based and compassionate care and are always looking for feedback. If you get a survey from the clinic please complete this so we can hear your opinions.  Living With Attention Deficit Hyperactivity Disorder If you have been diagnosed with attention deficit hyperactivity disorder (ADHD), you may be relieved that you now know why you have felt or behaved a certain way. Still, you may feel overwhelmed about the treatment ahead. You may also wonder how to get the support you need and how to deal with the condition day-to-day. With treatment and support, you can live with ADHD and manage your symptoms. How to manage lifestyle changes Managing lifestyle changes can be challenging. Seeking support from your healthcare provider, therapist, family, and friends can be helpful. How to recognize changes in your condition The following signs may mean that your treatment is working well and your condition is  improving: Consistently being on time for appointments. Being more organized at home and work. Other people noticing improvements in your behavior. Achieving goals that you set for yourself. Thinking more clearly. The following signs may mean that your treatment is not working very well: Feeling impatience or more confusion. Missing, forgetting, or being late for appointments. An increasing sense of disorganization and messiness. More difficulty in reaching goals that you set for yourself. Loved ones becoming angry or frustrated with you. Follow these instructions at home: Medicines Take over-the-counter and prescription medicines only as told by your health care provider. Check with your health care provider before taking any new medicines. General instructions Create structure and an organized atmosphere at home. For example: Make a list of tasks, then rank them from most important to least important. Work on one task at a time until your listed tasks are done. Make a daily schedule and follow it consistently every day. Use an appointment calendar, and check it 2-3 times a day to keep on track. Keep it with you when you leave the house. Create spaces where you keep certain things, and always put things back in their places after you use them. Keep all follow-up visits. Your health care provider will need to monitor your condition and adjust your treatment over time. Where to find support Talking to others  Keep emotion out of important discussions and speak in a calm, logical way. Listen closely and patiently to your loved ones. Try to understand their point of view, and try to avoid getting defensive. Take responsibility for the consequences of your actions. Ask that others  do not take your behaviors personally. Aim to solve problems as they come up, and express your feelings instead of bottling them up. Talk openly about what you need from your loved ones and how they can support  you. Consider going to family therapy sessions or having your family meet with a specialist who deals with ADHD-related behavior problems. Finances Not all insurance plans cover mental health care, so it is important to check with your insurance carrier. If paying for co-pays or counseling services is a problem, search for a local or county mental health care center. Public mental health care services may be offered there at a low cost or no cost when you are not able to see a private health care provider. If you are taking medicine for ADHD, you may be able to get the generic form, which may be less expensive than brand-name medicine. Some makers of prescription medicines also offer help to patients who cannot afford the medicines that they need. Therapy and support groups Talking with a mental health care provider and participating in support groups can help to improve your quality of life, daily functioning, and overall symptoms. Questions to ask your health care provider: What are the risks and benefits of taking medicines? Would I benefit from therapy? How often should I follow up with a health care provider? Where to find more information Learn more about ADHD from: Children and Adults with Attention Deficit Hyperactivity Disorder: [employer] of Mental Health: BloggerCourse.com Centers for Disease Control and Prevention: TonerPromos.no Contact a health care provider if: You have side effects from your medicines, such as: Repeated muscle twitches, coughing, or speech outbursts. Sleep problems. Loss of appetite. Dizziness. Unusually fast heartbeat. Stomach pains. Headaches. You have new or worsening behavior problems. You are struggling with anxiety, depression, or substance abuse. Get help right away if: You have a severe reaction to a medicine. These symptoms may be an emergency. Get help right away. Call 911. Do not wait to see if the symptoms will go away. Do not drive  yourself to the hospital. Take one of these steps if you feel like you may hurt yourself or others, or have thoughts about taking your own life: Go to your nearest emergency room. Call 911. Call the National Suicide Prevention Lifeline at 407 159 0130 or 988. This is open 24 hours a day. Text the Crisis Text Line at (901)436-2918. Summary With treatment and support, you can live with ADHD and manage your symptoms. Consider taking part in family therapy or self-help groups with family members or friends. When you talk with friends and family about your ADHD, be patient and communicate openly. Keep all follow-up visits. Your health care provider will need to monitor your condition and adjust your treatment over time. This information is not intended to replace advice given to you by your health care provider. Make sure you discuss any questions you have with your health care provider. Document Revised: 09/27/2021 Document Reviewed: 09/27/2021 Elsevier Patient Education  2024 [employer].

## 2023-11-30 ENCOUNTER — Other Ambulatory Visit: Payer: Self-pay

## 2023-11-30 ENCOUNTER — Encounter: Payer: Self-pay | Admitting: Nurse Practitioner

## 2023-11-30 ENCOUNTER — Telehealth: Admitting: Nurse Practitioner

## 2023-11-30 VITALS — BP 173/81 | HR 76

## 2023-11-30 DIAGNOSIS — F419 Anxiety disorder, unspecified: Secondary | ICD-10-CM

## 2023-11-30 DIAGNOSIS — F902 Attention-deficit hyperactivity disorder, combined type: Secondary | ICD-10-CM

## 2023-11-30 DIAGNOSIS — F331 Major depressive disorder, recurrent, moderate: Secondary | ICD-10-CM | POA: Diagnosis not present

## 2023-11-30 DIAGNOSIS — Z79899 Other long term (current) drug therapy: Secondary | ICD-10-CM

## 2023-11-30 MED ORDER — AMPHETAMINE-DEXTROAMPHET ER 25 MG PO CP24
25.0000 mg | ORAL_CAPSULE | ORAL | 0 refills | Status: DC
  Filled 2024-02-02: qty 30, 30d supply, fill #0

## 2023-11-30 MED ORDER — BUSPIRONE HCL 15 MG PO TABS
15.0000 mg | ORAL_TABLET | Freq: Every day | ORAL | 4 refills | Status: AC
  Filled 2023-11-30: qty 90, 90d supply, fill #0
  Filled 2024-03-01: qty 90, 90d supply, fill #1
  Filled 2024-07-04: qty 90, 90d supply, fill #2

## 2023-11-30 MED ORDER — AMPHETAMINE-DEXTROAMPHET ER 25 MG PO CP24
25.0000 mg | ORAL_CAPSULE | ORAL | 0 refills | Status: DC
  Filled 2024-03-01 – 2024-03-03 (×2): qty 30, 30d supply, fill #0

## 2023-11-30 MED ORDER — AMPHETAMINE-DEXTROAMPHET ER 25 MG PO CP24
25.0000 mg | ORAL_CAPSULE | ORAL | 0 refills | Status: DC
  Filled 2023-11-30: qty 30, 30d supply, fill #0

## 2023-11-30 NOTE — Assessment & Plan Note (Signed)
 Chronic, ongoing. Continue Wellbutrin  150 MG XL daily as is offering benefit, however made her aware of possibility for interactions with her Metoprolol  -- if these present I recommend stopping Wellbutrin  and switching to an alternative such as SSRI or SNRI.  Continue Buspar  but increase to 15 MG at night, this offers benefit to sleep but 15 MG works better.  She denies SI/HI.  May also benefit from therapy on regular basis.

## 2023-11-30 NOTE — Assessment & Plan Note (Signed)
 Chronic, ongoing.  Diagnosed at [facility] in November 2022. At this time continue Wellbutrin  150 MG XL daily.  Had to stop Vyvanse  in past due to cost.  Continue Adderall XR 25 MG daily which is offering her benefit - 15 was too low and 30 too high. Discussed with patient. She is aware of office rules on this and agreeable to yearly UDS and controlled subs agreement - up to date.  Return in 3 months.  UDS due next due in June 2026.  Recent one did not get resulted on review.

## 2023-11-30 NOTE — Assessment & Plan Note (Signed)
 Refer to depression plan of care.

## 2023-11-30 NOTE — Progress Notes (Signed)
 BP (!) 173/81   Pulse 76   LMP 04/27/2020    Subjective:    Patient ID: Jessica Cross, female    DOB: [birth year], 33 y.o.   MRN: [id number]  HPI: Jessica Cross is a 33 y.o. female  Chief Complaint  Patient presents with   ADHD   Depression   Virtual Visit via Video Note  I connected with Jessica Cross on 11/30/23 at  2:40 PM EDT by a video enabled telemedicine application and verified that I am speaking with the correct person using two identifiers.  Location: Patient: home Provider: work   I discussed the limitations of evaluation and management by telemedicine and the availability of in person appointments. The patient expressed understanding and agreed to proceed.  I discussed the assessment and treatment plan with the patient. The patient was provided an opportunity to ask questions and all were answered. The patient agreed with the plan and demonstrated an understanding of the instructions.   The patient was advised to call back or seek an in-person evaluation if the symptoms worsen or if the condition fails to improve as anticipated.  I provided 25 minutes of non-face-to-face time during this encounter.   Nili Honda T Genesis Paget, NP   ADHD FOLLOW UP Diagnosed in November 2022 by [facility] and taking Adderall XR 25 MG, in past 30 MG was too much and 15 MG was too little. Continues Wellbutrin  + Buspar  as well for mood, which offers benefit, sleeping well with Buspar  on board but would like to increase to 15 MG as she tried this and it worked better.  PDMP, last fill 10/29/23. ADHD status: ongoing Satisfied with current therapy: yes Medication compliance:  good compliance Controlled substance contract: yes Previous psychiatry evaluation: yes Previous medications: Vyvanse  and Adderall Taking meds on weekends/vacations: no Work/school performance:  good Difficulty sustaining attention/completing tasks: for the most part Distracted by extraneous stimuli:  more so in the evenings Does not listen when spoken to: sometimes Fidgets with hands or feet: yes Unable to stay in seat: yes Blurts out/interrupts others: no ADHD Medication Side Effects: yes    Decreased appetite: no     Headache: no    Sleeping disturbance pattern: no    Irritability: no    Rebound effects (worse than baseline) off medication: no    Anxiousness: no    Dizziness: no    Tics: no     11/30/2023    2:40 PM 08/28/2023    3:25 PM 07/27/2023    3:18 PM 04/24/2023    3:33 PM 01/09/2023    2:13 PM  Depression screen PHQ 2/9  Decreased Interest 2 1 1 1 2   Down, Depressed, Hopeless 1 1 1 1 1   PHQ - 2 Score 3 2 2 2 3   Altered sleeping 2 1 1 1 3   Tired, decreased energy 2 2 2 2 2   Change in appetite 0 0 0 0 0  Feeling bad or failure about yourself  1 0 1 1 0  Trouble concentrating 2 2 2 2 2   Moving slowly or fidgety/restless 0 0 0 0 0  Suicidal thoughts 0 0 0 0 0  PHQ-9 Score 10 7 8 8 10   Difficult doing work/chores Somewhat difficult Somewhat difficult Somewhat difficult  Not difficult at all       11/30/2023    2:42 PM 08/28/2023    3:25 PM 07/27/2023    3:19 PM 04/24/2023    3:33 PM  GAD  7 : Generalized Anxiety Score  Nervous, Anxious, on Edge 2 2 2 2   Control/stop worrying 2 2 2 2   Worry too much - different things 2 1 3 2   Trouble relaxing 2 1 2 1   Restless 2 1 2 1   Easily annoyed or irritable 2 1 3 2   Afraid - awful might happen 1 1 2 2   Total GAD 7 Score 13 9 16 12   Anxiety Difficulty Somewhat difficult Somewhat difficult Somewhat difficult    Relevant past medical, surgical, family and social history reviewed and updated as indicated. Interim medical history since our last visit reviewed. Allergies and medications reviewed and updated.  Review of Systems  Constitutional:  Negative for activity change, appetite change, chills, fatigue and fever.  Respiratory:  Negative for cough, chest tightness and wheezing.   Gastrointestinal: Negative.   Neurological:  Negative.   Psychiatric/Behavioral:  Positive for decreased concentration. Negative for self-injury, sleep disturbance and suicidal ideas. The patient is nervous/anxious.     Per HPI unless specifically indicated above     Objective:    BP (!) 173/81   Pulse 76   LMP 04/27/2020   Wt Readings from Last 3 Encounters:  08/28/23 147 lb 12.8 oz (67 kg)  04/24/23 151 lb 9.6 oz (68.8 kg)  10/10/22 147 lb 12.8 oz (67 kg)    Physical Exam Vitals and nursing note reviewed.  Constitutional:      General: She is awake. She is not in acute distress.    Appearance: She is well-developed. She is not ill-appearing.  HENT:     Head: Normocephalic.     Right Ear: Hearing normal.     Left Ear: Hearing normal.  Eyes:     General: Lids are normal.        Right eye: No discharge.        Left eye: No discharge.     Conjunctiva/sclera: Conjunctivae normal.  Pulmonary:     Effort: Pulmonary effort is normal. No accessory muscle usage or respiratory distress.  Musculoskeletal:     Cervical back: Normal range of motion.  Neurological:     Mental Status: She is alert and oriented to person, place, and time.  Psychiatric:        Attention and Perception: Attention normal.        Mood and Affect: Mood normal.        Behavior: Behavior normal. Behavior is cooperative.        Thought Content: Thought content normal.        Judgment: Judgment normal.    Results for orders placed or performed in visit on 08/28/23  CBC with Differential/Platelet   Collection Time: 08/28/23  3:56 PM  Result Value Ref Range   WBC 5.8 3.4 - 10.8 x10E3/uL   RBC 4.36 3.77 - 5.28 x10E6/uL   Hemoglobin 13.4 11.1 - 15.9 g/dL   Hematocrit 91.4 78.2 - 46.6 %   MCV 92 79 - 97 fL   MCH 30.7 26.6 - 33.0 pg   MCHC 33.5 31.5 - 35.7 g/dL   RDW 95.6 21.3 - 08.6 %   Platelets 278 150 - 450 x10E3/uL   Neutrophils 56 Not Estab. %   Lymphs 33 Not Estab. %   Monocytes 7 Not Estab. %   Eos 3 Not Estab. %   Basos 1 Not Estab. %    Neutrophils Absolute 3.3 1.4 - 7.0 x10E3/uL   Lymphocytes Absolute 1.9 0.7 - 3.1 x10E3/uL   Monocytes Absolute 0.4  0.1 - 0.9 x10E3/uL   EOS (ABSOLUTE) 0.2 0.0 - 0.4 x10E3/uL   Basophils Absolute 0.0 0.0 - 0.2 x10E3/uL   Immature Granulocytes 0 Not Estab. %   Immature Grans (Abs) 0.0 0.0 - 0.1 x10E3/uL  Comprehensive metabolic panel   Collection Time: 08/28/23  3:56 PM  Result Value Ref Range   Glucose 79 70 - 99 mg/dL   BUN 11 6 - 20 mg/dL   Creatinine, Ser 5.64 0.57 - 1.00 mg/dL   eGFR 332 >95 JO/ACZ/6.60   BUN/Creatinine Ratio 15 9 - 23   Sodium 136 134 - 144 mmol/L   Potassium 4.3 3.5 - 5.2 mmol/L   Chloride 101 96 - 106 mmol/L   CO2 22 20 - 29 mmol/L   Calcium 9.9 8.7 - 10.2 mg/dL   Total Protein 6.9 6.0 - 8.5 g/dL   Albumin 4.6 3.9 - 4.9 g/dL   Globulin, Total 2.3 1.5 - 4.5 g/dL   Bilirubin Total 0.3 0.0 - 1.2 mg/dL   Alkaline Phosphatase 54 44 - 121 IU/L   AST 18 0 - 40 IU/L   ALT 16 0 - 32 IU/L  TSH   Collection Time: 08/28/23  3:56 PM  Result Value Ref Range   TSH 2.300 0.450 - 4.500 uIU/mL  Lipid Panel w/o Chol/HDL Ratio   Collection Time: 08/28/23  3:56 PM  Result Value Ref Range   Cholesterol, Total 195 100 - 199 mg/dL   Triglycerides 630 0 - 149 mg/dL   HDL 61 >16 mg/dL   VLDL Cholesterol Cal 20 5 - 40 mg/dL   LDL Chol Calc (NIH) 010 (H) 0 - 99 mg/dL      Assessment & Plan:   Problem List Items Addressed This Visit       Other   Depression - Primary   Chronic, ongoing. Continue Wellbutrin  150 MG XL daily as is offering benefit, however made her aware of possibility for interactions with her Metoprolol  -- if these present I recommend stopping Wellbutrin  and switching to an alternative such as SSRI or SNRI.  Continue Buspar  but increase to 15 MG at night, this offers benefit to sleep but 15 MG works better.  She denies SI/HI.  May also benefit from therapy on regular basis.        Relevant Medications   busPIRone  (BUSPAR ) 15 MG tablet   Anxiety    Refer to depression plan of care.      Relevant Medications   busPIRone  (BUSPAR ) 15 MG tablet   ADHD (attention deficit hyperactivity disorder)   Chronic, ongoing.  Diagnosed at [facility] in November 2022. At this time continue Wellbutrin  150 MG XL daily.  Had to stop Vyvanse  in past due to cost.  Continue Adderall XR 25 MG daily which is offering her benefit - 15 was too low and 30 too high. Discussed with patient. She is aware of office rules on this and agreeable to yearly UDS and controlled subs agreement - up to date.  Return in 3 months.  UDS due next due in June 2026.  Recent one did not get resulted on review.      Relevant Orders   [id number] 11+Oxyco+Alc+Crt-Bund   Other Visit Diagnoses       High risk medication use       UDS ordered.   Relevant Orders   [id number] 11+Oxyco+Alc+Crt-Bund        Follow up plan: Return in about 3 months (around 03/01/2024) for ADD -- virtual.

## 2023-12-03 ENCOUNTER — Other Ambulatory Visit (HOSPITAL_COMMUNITY): Payer: Self-pay

## 2024-01-29 ENCOUNTER — Encounter: Payer: Self-pay | Admitting: Nurse Practitioner

## 2024-02-02 ENCOUNTER — Other Ambulatory Visit: Payer: Self-pay

## 2024-03-01 ENCOUNTER — Other Ambulatory Visit: Payer: Self-pay

## 2024-03-02 ENCOUNTER — Other Ambulatory Visit: Payer: Self-pay

## 2024-03-03 ENCOUNTER — Other Ambulatory Visit: Payer: Self-pay

## 2024-03-04 ENCOUNTER — Ambulatory Visit: Admitting: Obstetrics

## 2024-03-24 ENCOUNTER — Ambulatory Visit: Admitting: Nurse Practitioner

## 2024-04-03 ENCOUNTER — Other Ambulatory Visit: Payer: Self-pay

## 2024-04-03 MED ORDER — AZELAIC ACID 15 % EX GEL
CUTANEOUS | 2 refills | Status: AC
  Filled 2024-04-03: qty 50, 30d supply, fill #0

## 2024-04-03 MED ORDER — TRETINOIN 0.1 % EX CREA
TOPICAL_CREAM | CUTANEOUS | 2 refills | Status: AC
  Filled 2024-04-03: qty 45, 90d supply, fill #0

## 2024-04-06 NOTE — Progress Notes (Unsigned)
 PCP:  Jessica Melanie DASEN, NP   No chief complaint on file.    HPI:      Ms. Jessica Cross is a 33 y.o. G1P1001 whose LMP was Patient's last menstrual period was 04/27/2020., presents today for her annual examination.  Her menses are absent due to hyst.   Sex activity: {sex active: 315163}. No pain/bleeding/dryness. Last Pap: {ijuz:[id number]}  Results were: {norm/abn:16707::no abnormalities} /neg HPV DNA *** Hx of STDs: {STD hx:14358}  There is no FH of breast cancer. There is no FH of ovarian cancer. The patient {does:18564} do self-breast exams.  Tobacco use: {tob:20664} Alcohol use: {Alcohol:11675} No drug use.  Exercise: {exercise:31265}  She {does:18564} get adequate calcium and Vitamin D in her diet.  Patient Active Problem List   Diagnosis Date Noted   Controlled substance agreement signed 09/06/2021   ADHD (attention deficit hyperactivity disorder) 07/20/2020   Chronic pelvic pain in female 09/15/2018   PAC (premature atrial contraction) 07/02/2018   Anxiety 04/23/2017   Depression 01/22/2015   Anemia 09/09/2013    Past Surgical History:  Procedure Laterality Date   ABDOMINAL HYSTERECTOMY  04/30/20   COSMETIC SURGERY     CYSTOSCOPY  05/14/2020   Procedure: CYSTOSCOPY;  Surgeon: Ward, Mitzie BROCKS, MD;  Location: [facility];  Service: Gynecology;;   NOSE SURGERY     Due to broken nose as a child.   TOTAL LAPAROSCOPIC HYSTERECTOMY WITH SALPINGECTOMY Bilateral 05/14/2020   Procedure: TOTAL LAPAROSCOPIC HYSTERECTOMY WITH BILATERAL SALPINGECTOMY;  Surgeon: Ward, Mitzie BROCKS, MD;  Location: [facility];  Service: Gynecology;  Laterality: Bilateral;   WISDOM TOOTH EXTRACTION     bottom 2    Family History  Problem Relation Age of Onset   Healthy Mother    Healthy Father    Kidney cancer Maternal Aunt    Breast cancer Maternal Grandmother    Cancer Maternal Grandmother    Cancer Paternal Grandfather    Cancer Maternal Aunt     Social History   Socioeconomic  History   Marital status: Married    Spouse name: Not on file   Number of children: Not on file   Years of education: Not on file   Highest education level: Some college, no degree  Occupational History   Not on file  Tobacco Use   Smoking status: Every Day    Types: E-cigarettes   Smokeless tobacco: Never  Vaping Use   Vaping status: Some Days   Substances: Nicotine  Substance and Sexual Activity   Alcohol use: Yes    Alcohol/week: 5.0 - 10.0 standard drinks of alcohol    Types: 5 - 10 Standard drinks or equivalent per week    Comment: occasionally   Drug use: No   Sexual activity: Yes    Partners: Male    Birth control/protection: Surgical  Other Topics Concern   Not on file  Social History Narrative   Not on file   Social Drivers of Health   Financial Resource Strain: Low Risk  (08/28/2023)   Overall Financial Resource Strain (CARDIA)    Difficulty of Paying Living Expenses: Not hard at all  Food Insecurity: No Food Insecurity (08/28/2023)   Hunger Vital Sign    Worried About Running Out of Food in the Last Year: Never true    Ran Out of Food in the Last Year: Never true  Transportation Needs: No Transportation Needs (08/28/2023)   PRAPARE - Transportation    Lack of Transportation (Medical): No    Lack  of Transportation (Non-Medical): No  Physical Activity: Insufficiently Active (08/28/2023)   Exercise Vital Sign    Days of Exercise per Week: 2 days    Minutes of Exercise per Session: 20 min  Stress: Stress Concern Present (08/28/2023)   [name] of Occupational Health - Occupational Stress Questionnaire    Feeling of Stress : Rather much  Social Connections: Moderately Isolated (08/28/2023)   Social Connection and Isolation Panel    Frequency of Communication with Friends and Family: More than three times a week    Frequency of Social Gatherings with Friends and Family: Once a week    Attends Religious Services: Never    Database administrator or Organizations:  No    Attends Engineer, structural: Not on file    Marital Status: Married  Catering manager Violence: Not At Risk (08/28/2023)   Humiliation, Afraid, Rape, and Kick questionnaire    Fear of Current or Ex-Partner: No    Emotionally Abused: No    Physically Abused: No    Sexually Abused: No     Current Outpatient Medications:    amphetamine -dextroamphetamine  (ADDERALL XR) 25 MG 24 hr capsule, Take 1 capsule by mouth every morning., Disp: 30 capsule, Rfl: 0   amphetamine -dextroamphetamine  (ADDERALL XR) 25 MG 24 hr capsule, Take 1 capsule by mouth every morning., Disp: 30 capsule, Rfl: 0   amphetamine -dextroamphetamine  (ADDERALL XR) 25 MG 24 hr capsule, Take 1 capsule by mouth every morning., Disp: 30 capsule, Rfl: 0   Azelaic Acid 15 % gel, Apply a thin film to the affected area(s) twice daily; may reduce to once daily if persistent skin irritation occurs., Disp: 50 g, Rfl: 2   buPROPion  (WELLBUTRIN  XL) 150 MG 24 hr tablet, Take 1 tablet (150 mg total) by mouth daily., Disp: 90 tablet, Rfl: 5   busPIRone  (BUSPAR ) 15 MG tablet, Take 1 tablet (15 mg total) by mouth at bedtime., Disp: 90 tablet, Rfl: 4   metoprolol  succinate (TOPROL -XL) 25 MG 24 hr tablet, Take 0.5 tablets (12.5 mg total) by mouth daily., Disp: 45 tablet, Rfl: 4   tretinoin  (RETIN-A ) 0.05 % cream, Apply 1 pea-sized application topically to affected areas of skin at bedtime., Disp: 20 g, Rfl: 2   tretinoin  (RETIN-A ) 0.1 % cream, Apply a pea sized drop to affected areas of skin every night., Disp: 45 g, Rfl: 2     ROS:  Review of Systems BREAST: No symptoms   Objective: LMP 04/27/2020    OBGyn Exam  Results: No results found for this or any previous visit (from the past 24 hours).  Assessment/Plan: No diagnosis found.  No orders of the defined types were placed in this encounter.            GYN counsel {counseling: 16159}     F/U  No follow-ups on file.  Jessica Teater B. Siriyah Ambrosius, PA-C 04/06/2024 8:48  PM

## 2024-04-07 ENCOUNTER — Encounter: Payer: Self-pay | Admitting: Obstetrics and Gynecology

## 2024-04-07 ENCOUNTER — Ambulatory Visit (INDEPENDENT_AMBULATORY_CARE_PROVIDER_SITE_OTHER): Admitting: Obstetrics and Gynecology

## 2024-04-07 VITALS — BP 122/79 | HR 99 | Ht 60.0 in | Wt 142.0 lb

## 2024-04-07 DIAGNOSIS — N644 Mastodynia: Secondary | ICD-10-CM

## 2024-04-07 DIAGNOSIS — Z01419 Encounter for gynecological examination (general) (routine) without abnormal findings: Secondary | ICD-10-CM

## 2024-04-07 DIAGNOSIS — Z90710 Acquired absence of both cervix and uterus: Secondary | ICD-10-CM

## 2024-04-07 DIAGNOSIS — Z01411 Encounter for gynecological examination (general) (routine) with abnormal findings: Secondary | ICD-10-CM

## 2024-04-07 NOTE — Patient Instructions (Signed)
 I value your feedback and you entrusting [place] with your care. If you get a [place] patient survey, I would appreciate you taking the time to let us know about your experience today. Thank you! ? ? ?

## 2024-04-10 NOTE — Patient Instructions (Signed)
Be Involved in Caring For Your Health:  Taking Medications When medications are taken as directed, they can greatly improve your health. But if they are not taken as prescribed, they may not work. In some cases, not taking them correctly can be harmful. To help ensure your treatment remains effective and safe, understand your medications and how to take them. Bring your medications to each visit for review by your provider.  Your lab results, notes, and after visit summary will be available on My Chart. We strongly encourage you to use this feature. If lab results are abnormal the clinic will contact you with the appropriate steps. If the clinic does not contact you assume the results are satisfactory. You can always view your results on My Chart. If you have questions regarding your health or results, please contact the clinic during office hours. You can also ask questions on My Chart.  We at [facility] are grateful that you chose [place] to provide your care. We strive to provide evidence-based and compassionate care and are always looking for feedback. If you get a survey from the clinic please complete this so we can hear your opinions.  Attention Deficit Hyperactivity Disorder, Adult Attention deficit hyperactivity disorder (ADHD) is a mental health disorder that starts during childhood. For many people with ADHD, the disorder continues into the adult years. Treatment can help you manage your symptoms. There are three main types of ADHD: Inattentive. With this type, adults have difficulty paying attention. This may affect cognitive abilities. Hyperactive-impulsive. With this type, adults have a lot of energy and have difficulty controlling their behavior. Combination type. Some people may have symptoms of both types. What are the causes? The exact cause of ADHD is not known. Most experts believe a person's genes and environment possibly contribute to ADHD. What increases the  risk? The following factors may make you more likely to develop this condition: Having a first-degree relative such as a parent, brother, or sister, with the condition. Being born before 37 weeks of pregnancy (prematurely) or at a low birth weight. Being born to a mother who smoked tobacco or drank alcohol during pregnancy. Having experienced a brain injury. Being exposed to lead or other toxins in the womb or early in life. What are the signs or symptoms? Symptoms of this condition depend on the type of ADHD. Symptoms of the inattentive type include: Difficulty paying attention or following instructions. Often making simple mistakes. Being disorganized. Avoiding tasks that require time and attention. Losing and forgetting things. Symptoms of the hyperactive-impulsive type include: Restlessness. Talking out of turn, interrupting others, or talking too much. Difficulty with: Sitting still. Feeling motivated. Relaxing. Waiting in line or waiting for a turn. People with the combination type have symptoms of both of the other types. In adults, this condition may lead to certain problems, such as: Keeping jobs. Performing tasks at work. Having stable relationships. Being on time or keeping to a schedule. How is this diagnosed? This condition is diagnosed based on your current symptoms and your history of symptoms. The diagnosis can be made by a health care provider such as a primary care provider or a mental health care specialist. Your health care provider may use a symptom checklist or a behavior rating scale to evaluate your symptoms. Your health care provider may also want to talk with people who have observed your behaviors throughout your life. How is this treated? This condition can be treated with medicines and behavior therapy. Medicines may be  the best option to reduce impulsive behaviors and improve attention. Your health care provider may recommend: Stimulant medicines. These  are the most common medicines used for adult ADHD. They affect certain chemicals in the brain (neurotransmitters) and improve your ability to control your symptoms. A non-stimulant medicine. These medicines can also improve focus, attention, and impulsive behavior. It may take weeks to months to see the effects of this medicine. Counseling and behavioral management are also important for treating ADHD. Counseling is often used along with medicine. Your health care provider may suggest: Cognitive behavioral therapy (CBT). This type of therapy teaches you to replace negative thoughts and actions with positive thoughts and actions. When used as part of ADHD treatment, this therapy may also include: Coping strategies for organization, time management, impulse control, and stress reduction. Mindfulness and meditation training. Behavioral management. You may work with a Psychologist, occupational who is specially trained to help people with ADHD manage and organize activities and function more effectively. Follow these instructions at home: Medicines  Take over-the-counter and prescription medicines only as told by your health care provider. Talk with your health care provider about the possible side effects of your medicines and how to manage them. Alcohol use Do not drink alcohol if: Your health care provider tells you not to drink. You are pregnant, may be pregnant, or are planning to become pregnant. If you drink alcohol: Limit how much you use to: 0-1 drink a day for women. 0-2 drinks a day for men. Know how much alcohol is in your drink. In the [place] one drink equals one 12 oz bottle of beer (355 mL), one 5 oz glass of wine (148 mL), or one 1 oz glass of hard liquor (44 mL). Lifestyle  Do not use illegal drugs. Get enough sleep. Eat a healthy diet. Exercise regularly. Exercise can help to reduce stress and anxiety. General instructions Learn as much as you can about adult ADHD, and work closely with your  health care providers to find the treatments that work best for you. Follow the same schedule each day. Use reminder devices like notes, calendars, and phone apps to stay on time and organized. Keep all follow-up visits. Your health care provider will need to monitor your condition and adjust your treatment over time. Where to find more information A health care provider may be able to recommend resources that are available online or over the phone. You could start with: Attention Deficit Disorder Association (ADDA): [employer] of Mental Health [facility]): BloggerCourse.com Contact a health care provider if: Your symptoms continue to cause problems. You have side effects from your medicine, such as: Repeated muscle twitches, coughing, or speech outbursts. Sleep problems. Loss of appetite. Dizziness. Unusually fast heartbeat. Stomach pains. Headaches. You are struggling with anxiety, depression, or substance abuse. Get help right away if: You have a severe reaction to a medicine. This symptom may be an emergency. Get help right away. Call 911. Do not wait to see if the symptom will go away. Do not drive yourself to the hospital. Take one of these steps if you feel like you may hurt yourself or others, or have thoughts about taking your own life: Go to your nearest emergency room. Call 911. Call the National Suicide Prevention Lifeline at 862-475-4951 or 988. This is open 24 hours a day Text the Crisis Text Line at (361)343-8122. Summary ADHD is a mental health disorder that starts during childhood and often continues into your adult years. The exact cause of ADHD  is not known. Most experts believe genetics and environmental factors contribute to ADHD. There is no cure for ADHD, but treatment with medicine, cognitive behavioral therapy, or behavioral management can help you manage your condition. This information is not intended to replace advice given to you by your health care  provider. Make sure you discuss any questions you have with your health care provider. Document Revised: 09/27/2021 Document Reviewed: 09/27/2021 Elsevier Patient Education  2024 [employer].

## 2024-04-13 ENCOUNTER — Other Ambulatory Visit: Payer: Self-pay

## 2024-04-13 ENCOUNTER — Ambulatory Visit: Admitting: Nurse Practitioner

## 2024-04-13 VITALS — BP 122/85 | HR 72 | Temp 98.1°F | Resp 14 | Ht 60.0 in | Wt 141.0 lb

## 2024-04-13 DIAGNOSIS — F331 Major depressive disorder, recurrent, moderate: Secondary | ICD-10-CM

## 2024-04-13 DIAGNOSIS — Z79899 Other long term (current) drug therapy: Secondary | ICD-10-CM | POA: Diagnosis not present

## 2024-04-13 DIAGNOSIS — F419 Anxiety disorder, unspecified: Secondary | ICD-10-CM | POA: Diagnosis not present

## 2024-04-13 DIAGNOSIS — Z23 Encounter for immunization: Secondary | ICD-10-CM

## 2024-04-13 DIAGNOSIS — F902 Attention-deficit hyperactivity disorder, combined type: Secondary | ICD-10-CM

## 2024-04-13 MED ORDER — AMPHETAMINE-DEXTROAMPHETAMINE 30 MG PO TABS
30.0000 mg | ORAL_TABLET | Freq: Every day | ORAL | 0 refills | Status: DC
  Filled 2024-04-13: qty 30, 30d supply, fill #0

## 2024-04-13 NOTE — Assessment & Plan Note (Signed)
 Refer to depression plan of care.

## 2024-04-13 NOTE — Progress Notes (Signed)
 BP 122/85 (BP Location: Left Arm, Patient Position: Sitting, Cuff Size: Normal)   Pulse 72   Temp 98.1 F (36.7 C) (Oral)   Resp 14   Ht 5' (1.524 m)   Wt 141 lb (64 kg)   LMP 04/27/2020   SpO2 98%   BMI 27.54 kg/m    Subjective:    Patient ID: Jessica Cross, female    DOB: Oct 29, 1990, 33 y.o.   MRN: [id number]  HPI: Jessica Cross is a 33 y.o. female  Chief Complaint  Patient presents with   ADHD    Going well however does want to consider the SR version.    Insomnia    3 months. Only had 3 hours of sleep before having to be awake. Has tried OTC melatonin 10 mg x 2 weeks and previously on Buspar .    ADHD FOLLOW UP Diagnosed by [facility] in November 2022.  Took Vyvanse  initially which worked well, but insurance stopped coverage. Currently taking Adderall XR 25 MG daily, would like to try SR dosing as she worries XR dosing may be causing insomnia. Takes at 6 am.  Last fill on 03/03/24. Continues Wellbutrin  + Buspar  as well for mood.  Buspar  helps her fall asleep, but if does not go to sleep in the first 30 minutes after taking then has trouble sleeping. ADHD status: ongoing Satisfied with current therapy: yes Medication compliance:  good compliance Controlled substance contract: yes Previous psychiatry evaluation: yes Previous medications: Vyvanse    Taking meds on weekends/vacations: on occasion Work/school performance: good Difficulty sustaining attention/completing tasks:  no Distracted by extraneous stimuli: no Does not listen when spoken to: sometimes Fidgets with hands or feet: yes Unable to stay in seat: varies Blurts out/interrupts others: no ADHD Medication Side Effects: yes    Decreased appetite: no     Headache: no    Sleeping disturbance pattern: yes    Irritability: no    Rebound effects (worse than baseline) off medication: no    Anxiousness: no    Dizziness: no    Tics: no     04/13/2024    2:44 PM 11/30/2023    2:40 PM 08/28/2023     3:25 PM 07/27/2023    3:18 PM 04/24/2023    3:33 PM  Depression screen PHQ 2/9  Decreased Interest 3 2 1 1 1   Down, Depressed, Hopeless 1 1 1 1 1   PHQ - 2 Score 4 3 2 2 2   Altered sleeping 3 2 1 1 1   Tired, decreased energy 3 2 2 2 2   Change in appetite 1 0 0 0 0  Feeling bad or failure about yourself  2 1 0 1 1  Trouble concentrating 2 2 2 2 2   Moving slowly or fidgety/restless 0 0 0 0 0  Suicidal thoughts 0 0 0 0 0  PHQ-9 Score 15 10 7 8 8   Difficult doing work/chores  Somewhat difficult Somewhat difficult Somewhat difficult        04/13/2024    2:44 PM 11/30/2023    2:42 PM 08/28/2023    3:25 PM 07/27/2023    3:19 PM  GAD 7 : Generalized Anxiety Score  Nervous, Anxious, on Edge 1 2 2 2   Control/stop worrying 2 2 2 2   Worry too much - different things 2 2 1 3   Trouble relaxing 1 2 1 2   Restless 1 2 1 2   Easily annoyed or irritable 2 2 1 3   Afraid - awful might  happen 1 1 1 2   Total GAD 7 Score 10 13 9 16   Anxiety Difficulty Somewhat difficult Somewhat difficult Somewhat difficult Somewhat difficult   Relevant past medical, surgical, family and social history reviewed and updated as indicated. Interim medical history since our last visit reviewed. Allergies and medications reviewed and updated.  Review of Systems  Constitutional:  Negative for activity change, appetite change, chills, fatigue and fever.  Respiratory:  Negative for cough, chest tightness and wheezing.   Gastrointestinal: Negative.   Neurological: Negative.   Psychiatric/Behavioral:  Positive for decreased concentration. Negative for self-injury, sleep disturbance and suicidal ideas. The patient is nervous/anxious.     Per HPI unless specifically indicated above     Objective:    BP 122/85 (BP Location: Left Arm, Patient Position: Sitting, Cuff Size: Normal)   Pulse 72   Temp 98.1 F (36.7 C) (Oral)   Resp 14   Ht 5' (1.524 m)   Wt 141 lb (64 kg)   LMP 04/27/2020   SpO2 98%   BMI 27.54 kg/m   Wt  Readings from Last 3 Encounters:  04/13/24 141 lb (64 kg)  04/07/24 142 lb (64.4 kg)  08/28/23 147 lb 12.8 oz (67 kg)    Physical Exam Vitals and nursing note reviewed.  Constitutional:      General: She is awake. She is not in acute distress.    Appearance: She is well-developed and well-groomed. She is not ill-appearing or toxic-appearing.  HENT:     Head: Normocephalic.     Right Ear: Hearing and external ear normal.     Left Ear: Hearing and external ear normal.  Eyes:     General: Lids are normal.        Right eye: No discharge.        Left eye: No discharge.     Conjunctiva/sclera: Conjunctivae normal.     Pupils: Pupils are equal, round, and reactive to light.  Neck:     Thyroid : No thyromegaly.     Vascular: No carotid bruit.  Cardiovascular:     Rate and Rhythm: Normal rate and regular rhythm.     Heart sounds: Normal heart sounds. No murmur heard.    No gallop.  Pulmonary:     Effort: Pulmonary effort is normal. No accessory muscle usage or respiratory distress.     Breath sounds: Normal breath sounds.  Abdominal:     General: Bowel sounds are normal. There is no distension.     Palpations: Abdomen is soft.     Tenderness: There is no abdominal tenderness.  Musculoskeletal:     Cervical back: Normal range of motion and neck supple.     Right lower leg: No edema.     Left lower leg: No edema.  Lymphadenopathy:     Cervical: No cervical adenopathy.  Skin:    General: Skin is warm and dry.  Neurological:     Mental Status: She is alert and oriented to person, place, and time.     Deep Tendon Reflexes: Reflexes are normal and symmetric.     Reflex Scores:      Brachioradialis reflexes are 2+ on the right side and 2+ on the left side.      Patellar reflexes are 2+ on the right side and 2+ on the left side. Psychiatric:        Attention and Perception: Attention normal.        Mood and Affect: Mood normal.  Speech: Speech normal.        Behavior: Behavior  normal. Behavior is cooperative.        Thought Content: Thought content normal.     Results for orders placed or performed in visit on 08/28/23  CBC with Differential/Platelet   Collection Time: 08/28/23  3:56 PM  Result Value Ref Range   WBC 5.8 3.4 - 10.8 x10E3/uL   RBC 4.36 3.77 - 5.28 x10E6/uL   Hemoglobin 13.4 11.1 - 15.9 g/dL   Hematocrit 59.9 65.9 - 46.6 %   MCV 92 79 - 97 fL   MCH 30.7 26.6 - 33.0 pg   MCHC 33.5 31.5 - 35.7 g/dL   RDW 88.1 88.2 - 84.5 %   Platelets 278 150 - 450 x10E3/uL   Neutrophils 56 Not Estab. %   Lymphs 33 Not Estab. %   Monocytes 7 Not Estab. %   Eos 3 Not Estab. %   Basos 1 Not Estab. %   Neutrophils Absolute 3.3 1.4 - 7.0 x10E3/uL   Lymphocytes Absolute 1.9 0.7 - 3.1 x10E3/uL   Monocytes Absolute 0.4 0.1 - 0.9 x10E3/uL   EOS (ABSOLUTE) 0.2 0.0 - 0.4 x10E3/uL   Basophils Absolute 0.0 0.0 - 0.2 x10E3/uL   Immature Granulocytes 0 Not Estab. %   Immature Grans (Abs) 0.0 0.0 - 0.1 x10E3/uL  Comprehensive metabolic panel   Collection Time: 08/28/23  3:56 PM  Result Value Ref Range   Glucose 79 70 - 99 mg/dL   BUN 11 6 - 20 mg/dL   Creatinine, Ser 9.28 0.57 - 1.00 mg/dL   eGFR 885 >40 fO/fpw/8.26   BUN/Creatinine Ratio 15 9 - 23   Sodium 136 134 - 144 mmol/L   Potassium 4.3 3.5 - 5.2 mmol/L   Chloride 101 96 - 106 mmol/L   CO2 22 20 - 29 mmol/L   Calcium 9.9 8.7 - 10.2 mg/dL   Total Protein 6.9 6.0 - 8.5 g/dL   Albumin 4.6 3.9 - 4.9 g/dL   Globulin, Total 2.3 1.5 - 4.5 g/dL   Bilirubin Total 0.3 0.0 - 1.2 mg/dL   Alkaline Phosphatase 54 44 - 121 IU/L   AST 18 0 - 40 IU/L   ALT 16 0 - 32 IU/L  TSH   Collection Time: 08/28/23  3:56 PM  Result Value Ref Range   TSH 2.300 0.450 - 4.500 uIU/mL  Lipid Panel w/o Chol/HDL Ratio   Collection Time: 08/28/23  3:56 PM  Result Value Ref Range   Cholesterol, Total 195 100 - 199 mg/dL   Triglycerides 886 0 - 149 mg/dL   HDL 61 >60 mg/dL   VLDL Cholesterol Cal 20 5 - 40 mg/dL   LDL Chol Calc  (NIH) 114 (H) 0 - 99 mg/dL      Assessment & Plan:   Problem List Items Addressed This Visit       Other   Depression - Primary   Chronic, ongoing. Continue Wellbutrin  150 MG XL daily as is offering benefit, however made her aware of possibility for interactions with her Metoprolol . If these present I recommend stopping Wellbutrin  and switching to an alternative such as SSRI or SNRI.  Continue Buspar  15 MG at night, this offers benefit to sleep but 15 MG works better.  She denies SI/HI.  May also benefit from therapy on regular basis.        Anxiety   Refer to depression plan of care.      ADHD (attention deficit hyperactivity disorder)  Chronic, ongoing.  Diagnosed at [facility] in November 2022. At this time continue Wellbutrin  150 MG XL daily.  Had to stop Vyvanse  in past due to cost.  Continue Adderall but change to SR 30 MG daily to see if less issues with sleep. Discussed with patient. She is aware of office rules on this and agreeable to yearly UDS and controlled subs agreement - up to date.  Return in 3 months.  UDS due next due 04/13/25.   - She will alert PCP if SR dosing improves sleep and if so will send in refills, if not will consider adjusting or return to XR dosing      Relevant Orders   235116 11+Oxyco+Alc+Crt-Bund   Other Visit Diagnoses       High risk medication use       UDS obtained   Relevant Orders   235116 11+Oxyco+Alc+Crt-Bund     Flu vaccine need       Flu vaccine today, educated patient.   Relevant Orders   Flu vaccine trivalent PF, 6mos and older(Flulaval,Afluria,Fluarix,Fluzone) (Completed)        Follow up plan: Return in about 3 months (around 07/14/2024) for ADHD -- virtual.

## 2024-04-13 NOTE — Assessment & Plan Note (Addendum)
 Chronic, ongoing.  Diagnosed at [facility] in November 2022. At this time continue Wellbutrin  150 MG XL daily.  Had to stop Vyvanse  in past due to cost.  Continue Adderall but change to SR 30 MG daily to see if less issues with sleep. Discussed with patient. She is aware of office rules on this and agreeable to yearly UDS and controlled subs agreement - up to date.  Return in 3 months.  UDS due next due 04/13/25.   - She will alert PCP if SR dosing improves sleep and if so will send in refills, if not will consider adjusting or return to XR dosing

## 2024-04-13 NOTE — Assessment & Plan Note (Signed)
 Chronic, ongoing. Continue Wellbutrin  150 MG XL daily as is offering benefit, however made her aware of possibility for interactions with her Metoprolol . If these present I recommend stopping Wellbutrin  and switching to an alternative such as SSRI or SNRI.  Continue Buspar  15 MG at night, this offers benefit to sleep but 15 MG works better.  She denies SI/HI.  May also benefit from therapy on regular basis.

## 2024-04-14 LAB — DRUG SCREEN 764883 11+OXYCO+ALC+CRT-BUND

## 2024-04-16 LAB — DRUG PROFILE 799016
Amphetamine GC/MS Conf: >3000 ng/mL
Amphetamine: POSITIVE — AB
Amphetamines: POSITIVE — AB
Methamphetamine: NEGATIVE

## 2024-04-16 LAB — DRUG SCREEN 764883 11+OXYCO+ALC+CRT-BUND
BENZODIAZ UR QL: NEGATIVE ng/mL
Barbiturate screen, urine: NEGATIVE ng/mL
Cannabinoid Quant, Ur: NEGATIVE ng/mL
Cocaine (Metab.): NEGATIVE ng/mL
Creatinine, Urine: 91.9 mg/dL (ref 20.0–300.0)
Ethanol, Urine: NEGATIVE %
Meperidine: NEGATIVE ng/mL
Methadone Screen, Urine: NEGATIVE ng/mL
Nitrite Urine, Quantitative: NEGATIVE ug/mL
OPIATE SCREEN URINE: NEGATIVE ng/mL
Oxycodone/Oxymorphone, Urine: NEGATIVE ng/mL
PCP Quant, Ur: NEGATIVE ng/mL
Propoxyphene: NEGATIVE ng/mL
Tramadol: NEGATIVE ng/mL
pH, Urine: 5.5 (ref 4.5–8.9)

## 2024-05-23 ENCOUNTER — Other Ambulatory Visit: Payer: Self-pay

## 2024-05-23 ENCOUNTER — Other Ambulatory Visit: Payer: Self-pay | Admitting: Nurse Practitioner

## 2024-05-24 ENCOUNTER — Other Ambulatory Visit: Payer: Self-pay

## 2024-05-24 ENCOUNTER — Other Ambulatory Visit: Payer: Self-pay | Admitting: Nurse Practitioner

## 2024-05-26 ENCOUNTER — Other Ambulatory Visit: Payer: Self-pay

## 2024-05-27 ENCOUNTER — Other Ambulatory Visit: Payer: Self-pay

## 2024-05-27 MED FILL — Amphetamine-Dextroamphetamine Tab 30 MG: ORAL | 30 days supply | Qty: 30 | Fill #0 | Status: AC

## 2024-05-27 NOTE — Telephone Encounter (Signed)
 Requested medications are due for refill today.  yes  Requested medications are on the active medications list.  yes  Last refill. 04/13/2024 #30 0 rf  Future visit scheduled.   yes  Notes to clinic.  Refill not delegated.    Requested Prescriptions  Pending Prescriptions Disp Refills   amphetamine -dextroamphetamine  (ADDERALL) 30 MG tablet 30 tablet 0    Sig: Take 1 tablet by mouth daily.     Not Delegated - Psychiatry:  Stimulants/ADHD Failed - 05/27/2024  1:01 PM      Failed - This refill cannot be delegated      Passed - Urine Drug Screen completed in last 360 days      Passed - Last BP in normal range    BP Readings from Last 1 Encounters:  04/13/24 122/85         Passed - Last Heart Rate in normal range    Pulse Readings from Last 1 Encounters:  04/13/24 72         Passed - Valid encounter within last 6 months    Recent Outpatient Visits           1 month ago Moderate episode of recurrent major depressive disorder (HCC)   [place] [facility] [place], Jolene T, NP   5 months ago Moderate episode of recurrent major depressive disorder (HCC)   [place] [facility] [place], Jolene T, NP   9 months ago Moderate episode of recurrent major depressive disorder (HCC)   [place] [facility] [place], Jolene T, NP   10 months ago Moderate episode of recurrent major depressive disorder (HCC)   [place] [facility] [place], Melanie T, NP   8 years ago Thumb pain, right   [facility] [place] Mario Million, MD

## 2024-05-30 ENCOUNTER — Other Ambulatory Visit: Payer: Self-pay

## 2024-07-02 NOTE — Patient Instructions (Signed)

## 2024-07-04 ENCOUNTER — Other Ambulatory Visit: Payer: Self-pay

## 2024-07-06 ENCOUNTER — Other Ambulatory Visit: Payer: Self-pay

## 2024-07-06 ENCOUNTER — Telehealth: Admitting: Nurse Practitioner

## 2024-07-06 ENCOUNTER — Encounter: Payer: Self-pay | Admitting: Nurse Practitioner

## 2024-07-06 VITALS — Wt 140.0 lb

## 2024-07-06 DIAGNOSIS — F909 Attention-deficit hyperactivity disorder, unspecified type: Secondary | ICD-10-CM | POA: Diagnosis not present

## 2024-07-06 DIAGNOSIS — F902 Attention-deficit hyperactivity disorder, combined type: Secondary | ICD-10-CM

## 2024-07-06 DIAGNOSIS — F331 Major depressive disorder, recurrent, moderate: Secondary | ICD-10-CM

## 2024-07-06 DIAGNOSIS — F419 Anxiety disorder, unspecified: Secondary | ICD-10-CM | POA: Diagnosis not present

## 2024-07-06 DIAGNOSIS — F32A Depression, unspecified: Secondary | ICD-10-CM

## 2024-07-06 MED ORDER — AMPHETAMINE-DEXTROAMPHETAMINE 30 MG PO TABS: 30.0000 mg | ORAL_TABLET | Freq: Every day | ORAL | 0 refills | Status: AC

## 2024-07-06 MED ORDER — AMPHETAMINE-DEXTROAMPHETAMINE 30 MG PO TABS
30.0000 mg | ORAL_TABLET | Freq: Every day | ORAL | 0 refills | Status: AC
  Filled 2024-07-06: qty 30, 30d supply, fill #0

## 2024-07-06 NOTE — Progress Notes (Signed)
 "  Wt 140 lb (63.5 kg)   LMP 04/27/2020   BMI 27.34 kg/m    Subjective:    Patient ID: Jessica Cross, female    DOB: July 21, 1990, 34 y.o.   MRN: [id number]  HPI: Jessica Cross is a 34 y.o. female  Chief Complaint  Patient presents with   Medication Follow-Up    Med follow up no concerns,   Virtual Visit via Video Note  I connected with Jessica Cross on 07/06/2024 at  2:40 PM EST by a video enabled telemedicine application and verified that I am speaking with the correct person using two identifiers.  Location: Patient: home Provider: work   I discussed the limitations of evaluation and management by telemedicine and the availability of in person appointments. The patient expressed understanding and agreed to proceed.  I discussed the assessment and treatment plan with the patient. The patient was provided an opportunity to ask questions and all were answered. The patient agreed with the plan and demonstrated an understanding of the instructions.   The patient was advised to call back or seek an in-person evaluation if the symptoms worsen or if the condition fails to improve as anticipated.  I provided 25 minutes of non-face-to-face time during this encounter.   Chloee Tena T Vihaan Gloss, NP   ADHD FOLLOW UP Diagnosed by [facility] in November 2022. Initially took Vyvanse  but insurance stopped covering. Takes Adderall 30MG  daily, has less insomnia with this. Last fill on 05/27/24. Takes Wellbutrin  + Buspar  as well for mood.  Buspar  helps her fall asleep. ADHD status: ongoing Satisfied with current therapy: yes Medication compliance:  good compliance Controlled substance contract: yes Previous psychiatry evaluation: yes Previous medications: Vyvanse    Taking meds on weekends/vacations: on occasion Work/school performance: good Difficulty sustaining attention/completing tasks:  no Distracted by extraneous stimuli: no Does not listen when spoken to:  sometimes Fidgets with hands or feet: yes Unable to stay in seat: improving Blurts out/interrupts others: no ADHD Medication Side Effects: yes    Decreased appetite: no     Headache: no    Sleeping disturbance pattern: no    Irritability: no    Rebound effects (worse than baseline) off medication: no    Anxiousness: no    Dizziness: no    Tics: no     07/06/2024    2:34 PM 04/13/2024    2:44 PM 11/30/2023    2:40 PM 08/28/2023    3:25 PM 07/27/2023    3:18 PM  Depression screen PHQ 2/9  Decreased Interest 2 3 2 1 1   Down, Depressed, Hopeless 1 1 1 1 1   PHQ - 2 Score 3 4 3 2 2   Altered sleeping 3 3 2 1 1   Tired, decreased energy 2 3 2 2 2   Change in appetite 0 1 0 0 0  Feeling bad or failure about yourself  0 2 1 0 1  Trouble concentrating 2 2 2 2 2   Moving slowly or fidgety/restless 1 0 0 0 0  Suicidal thoughts 0 0 0 0 0  PHQ-9 Score 11 15  10  7  8    Difficult doing work/chores Somewhat difficult  Somewhat difficult Somewhat difficult Somewhat difficult     Data saved with a previous flowsheet row definition       07/06/2024    2:36 PM 04/13/2024    2:44 PM 11/30/2023    2:42 PM 08/28/2023    3:25 PM  GAD 7 : Generalized Anxiety Score  Nervous, Anxious, on Edge 2 1 2 2   Control/stop worrying 1 2 2 2   Worry too much - different things 2 2 2 1   Trouble relaxing 2 1 2 1   Restless 1 1 2 1   Easily annoyed or irritable 2 2 2 1   Afraid - awful might happen 2 1 1 1   Total GAD 7 Score 12 10 13 9   Anxiety Difficulty Somewhat difficult Somewhat difficult Somewhat difficult Somewhat difficult   Relevant past medical, surgical, family and social history reviewed and updated as indicated. Interim medical history since our last visit reviewed. Allergies and medications reviewed and updated.  Review of Systems  Constitutional:  Negative for activity change, appetite change, chills, fatigue and fever.  Respiratory:  Negative for cough, chest tightness and wheezing.   Gastrointestinal:  Negative.   Neurological: Negative.   Psychiatric/Behavioral:  Positive for decreased concentration. Negative for self-injury, sleep disturbance and suicidal ideas. The patient is nervous/anxious.     Per HPI unless specifically indicated above     Objective:    Wt 140 lb (63.5 kg)   LMP 04/27/2020   BMI 27.34 kg/m   Wt Readings from Last 3 Encounters:  07/06/24 140 lb (63.5 kg)  04/13/24 141 lb (64 kg)  04/07/24 142 lb (64.4 kg)    Physical Exam Vitals and nursing note reviewed.  Constitutional:      General: She is awake. She is not in acute distress.    Appearance: She is well-developed. She is not ill-appearing.  HENT:     Head: Normocephalic.     Right Ear: Hearing normal.     Left Ear: Hearing normal.  Eyes:     General: Lids are normal.        Right eye: No discharge.        Left eye: No discharge.     Conjunctiva/sclera: Conjunctivae normal.  Pulmonary:     Effort: Pulmonary effort is normal. No accessory muscle usage or respiratory distress.  Musculoskeletal:     Cervical back: Normal range of motion.  Neurological:     Mental Status: She is alert and oriented to person, place, and time.  Psychiatric:        Attention and Perception: Attention normal.        Mood and Affect: Mood normal.        Behavior: Behavior normal. Behavior is cooperative.        Thought Content: Thought content normal.        Judgment: Judgment normal.     Results for orders placed or performed in visit on 04/13/24  [id number] 11+Oxyco+Alc+Crt-Bund   Collection Time: 04/13/24  3:11 PM  Result Value Ref Range   Amphetamines, Urine See Final Results Cutoff=1000 ng/mL   Barbiturate screen, urine Negative Cutoff=200 ng/mL   BENZODIAZ UR QL Negative Cutoff=200 ng/mL   Cannabinoid Quant, Ur Negative Cutoff=50 ng/mL   Cocaine (Metab.) Negative Cutoff=300 ng/mL   OPIATE SCREEN URINE Negative Cutoff=300 ng/mL   Oxycodone /Oxymorphone, Urine Negative Cutoff=300 ng/mL   PCP Quant, Ur  Negative Cutoff=25 ng/mL   Methadone Screen, Urine Negative Cutoff=300 ng/mL   Propoxyphene Negative Cutoff=300 ng/mL   Meperidine  Negative Cutoff=200 ng/mL   Tramadol Ur Negative Cutoff=200 ng/mL   Ethanol, Urine Negative Cutoff=0.020 %   Creatinine, Urine 91.9 20.0 - 300.0 mg/dL   Nitrite Urine, Quantitative Negative Cutoff=200 mcg/mL   pH, Urine 5.5 4.5 - 8.9  Drug Profile [id number]   Collection Time: 04/13/24  3:11 PM  Result Value Ref  Range   Amphetamines Positive (A) Cutoff=1000   Amphetamine  Positive (A)    Amphetamine  GC/MS Conf >3000 Cutoff=500 ng/mL   Methamphetamine Negative Cutoff=500      Assessment & Plan:   Problem List Items Addressed This Visit       Other   Depression - Primary   Chronic, ongoing. Continue Wellbutrin  150 MG XL daily as is offering benefit, however made her aware of possibility for interactions with her Metoprolol . If these present I recommend stopping Wellbutrin  and switching to an alternative such as SSRI or SNRI.  Continue Buspar  15 MG at night, this offers benefit to sleep but 15 MG works better.  She denies SI/HI.  May also benefit from therapy on regular basis.        Anxiety   Refer to depression plan of care.      ADHD (attention deficit hyperactivity disorder)   Chronic, ongoing.  Diagnosed at [facility] in November 2022. At this time continue Wellbutrin  150 MG XL daily.  Had to stop Vyvanse  in past due to cost.  Continue Adderall 30 MG daily which is offering benefit. She is aware of office rules on this and agreeable to yearly UDS and controlled subs agreement - up to date.  Return in 3 months.  UDS due next due 04/13/25.            Follow up plan: Return in about 3 months (around 10/04/2024) for Annual Physical.      "

## 2024-07-06 NOTE — Assessment & Plan Note (Signed)
 Chronic, ongoing. Continue Wellbutrin  150 MG XL daily as is offering benefit, however made her aware of possibility for interactions with her Metoprolol . If these present I recommend stopping Wellbutrin  and switching to an alternative such as SSRI or SNRI.  Continue Buspar  15 MG at night, this offers benefit to sleep but 15 MG works better.  She denies SI/HI.  May also benefit from therapy on regular basis.

## 2024-07-06 NOTE — Assessment & Plan Note (Signed)
 Refer to depression plan of care.

## 2024-07-06 NOTE — Assessment & Plan Note (Signed)
 Chronic, ongoing.  Diagnosed at [facility] in November 2022. At this time continue Wellbutrin  150 MG XL daily.  Had to stop Vyvanse  in past due to cost.  Continue Adderall 30 MG daily which is offering benefit. She is aware of office rules on this and agreeable to yearly UDS and controlled subs agreement - up to date.  Return in 3 months.  UDS due next due 04/13/25.

## 2024-07-11 NOTE — Progress Notes (Signed)
 Appointment has been made. A message has been left for patient to check Mychart

## 2024-07-11 NOTE — Progress Notes (Signed)
 Called patient lvm for patient to call office and schedule 3 month appt for annual physical and refills

## 2024-07-15 ENCOUNTER — Other Ambulatory Visit: Payer: Self-pay

## 2024-10-12 ENCOUNTER — Encounter: Admitting: Nurse Practitioner
